# Patient Record
Sex: Female | Born: 1940 | Race: White | Hispanic: No | Marital: Married | Smoking: Former smoker
Health system: Southern US, Community
[De-identification: ages and names within clinical notes are randomized; demographics above are authoritative.]

## PROBLEM LIST (undated history)

## (undated) DIAGNOSIS — F418 Other specified anxiety disorders: Secondary | ICD-10-CM

## (undated) DIAGNOSIS — Z9889 Other specified postprocedural states: Secondary | ICD-10-CM

## (undated) DIAGNOSIS — H3530 Unspecified macular degeneration: Secondary | ICD-10-CM

## (undated) DIAGNOSIS — E7800 Pure hypercholesterolemia, unspecified: Secondary | ICD-10-CM

## (undated) DIAGNOSIS — R7303 Prediabetes: Secondary | ICD-10-CM

## (undated) DIAGNOSIS — I10 Essential (primary) hypertension: Secondary | ICD-10-CM

## (undated) DIAGNOSIS — I639 Cerebral infarction, unspecified: Secondary | ICD-10-CM

## (undated) DIAGNOSIS — R112 Nausea with vomiting, unspecified: Secondary | ICD-10-CM

## (undated) DIAGNOSIS — M1990 Unspecified osteoarthritis, unspecified site: Secondary | ICD-10-CM

## (undated) HISTORY — DX: Unspecified macular degeneration: H35.30

## (undated) HISTORY — PX: OTHER SURGICAL HISTORY: SHX169

## (undated) HISTORY — DX: Other specified anxiety disorders: F41.8

## (undated) HISTORY — PX: KNEE ARTHROSCOPY: SHX127

## (undated) HISTORY — PX: LAPAROSCOPIC APPENDECTOMY: SUR753

## (undated) HISTORY — DX: Cerebral infarction, unspecified: I63.9

## (undated) HISTORY — PX: CATARACT EXTRACTION: SUR2

## (undated) HISTORY — PX: APPENDECTOMY: SHX54

## (undated) HISTORY — PX: HEMATOMA EVACUATION: SHX5118

## (undated) HISTORY — PX: REPLACEMENT TOTAL KNEE: SUR1224

## (undated) HISTORY — PX: TONSILLECTOMY: SUR1361

---

## 1996-02-07 HISTORY — PX: POSTERIOR TIBIAL TENDON REPAIR: SHX6039

## 2005-08-25 ENCOUNTER — Ambulatory Visit (HOSPITAL_COMMUNITY): Admission: RE | Admit: 2005-08-25 | Discharge: 2005-08-25 | Payer: Self-pay | Admitting: Otolaryngology

## 2010-06-24 NOTE — Op Note (Signed)
Sarah Castro, Sarah Castro            ACCOUNT NO.:  [id number]   MEDICAL RECORD NO.:  [id number]          PATIENT TYPE:  AMB   LOCATION:  SDS                          FACILITY:  [facility]   PHYSICIAN:  Jefry H. Pollyann Kennedy, MD     DATE OF BIRTH:  Nov 03, 1940   DATE OF PROCEDURE:  DATE OF DISCHARGE:                                 OPERATIVE REPORT   PREOPERATIVE DIAGNOSES:  1. Nasal fracture with displacement.  2. Hematoma of the right nasal ala.   POSTOPERATIVE DIAGNOSES:  1. Nasal fracture with displacement.  2. Hematoma of the right nasal. ala.   PROCEDURE:  1. Closed reduction, nasal fracture with stabilization.  2. Incision and drainage of right nasal hematoma.   SURGEON:  Jefry H. Pollyann Kennedy, MD   ANESTHESIA:  General endotracheal anesthesia was used.   COMPLICATIONS:  None.   FINDINGS:  Depressed right nasal bone fracture with displacement, septal  deviation, and a large hematoma involving the right ala.   REFERRING PHYSICIAN:  Dr. Doyne Keel.   BLOOD LOSS:  Minimal.   DISPOSITION:  The patient tolerated the procedure well and was awakened,  extubated, transferred to Recovery in stable condition.   HISTORY:  This is a 70 year old lady who fell down some stairs about 9 days  ago and suffered significant injury to her nose that included a nasal dorsal  and septal deviation as well as an obvious hematoma of the right nasal alar  area.  Risks, benefits, alternatives, complications of the procedure were  explained to the patient.  She seemed to understand and agreed to surgery.   OPERATIVE PROCEDURE:  The patient was taken to the operating room and placed  on the operating table in the supine position.  Following induction of  general endotracheal anesthesia, the face was draped in the standard  fashion.  Oxymetazoline spray was used preoperatively in the nasal cavities.  1% Xylocaine with epinephrine was infiltrated into the septum and the nasal  pyriform aperture mucosa bilaterally.   Afrin-soaked pledges were placed in  the nasal cavities for several minutes.   Incision and drainage of nasal alar hematoma.  A 15 scalpel was used to  incise the vestibular mucosa just overlying the hematoma.  A large amount of  old, thick, clotted blood was evacuated and suction was used to clear out  any additional blood from the hematoma cavity.  The fracture was then  reduced using a butter knife and an Ash forceps to elevate the right nasal  bone as well as to elevate and reduce the deflection of the septum.  There  was nice reduction that seemed to be pretty stable.  The nasal cavities were  packed with large Merocel packs, and the nasal dorsum was dressed with benzo  and Steri-Strips and an Aquaplast splint.  The patient was then awakened  from anesthesia, extubated, and transferred to Recovery in stable condition.      Jefry H. Pollyann Kennedy, MD  Electronically Signed     JHR/MEDQ  D:  08/25/2005  T:  08/25/2005  Job:  [id number]   cc:   Forrest Moron

## 2011-02-07 HISTORY — PX: BUNIONECTOMY: SHX129

## 2011-02-07 HISTORY — PX: HAMMER TOE SURGERY: SHX385

## 2011-03-20 DIAGNOSIS — H40009 Preglaucoma, unspecified, unspecified eye: Secondary | ICD-10-CM | POA: Diagnosis not present

## 2011-04-06 DIAGNOSIS — H538 Other visual disturbances: Secondary | ICD-10-CM | POA: Diagnosis not present

## 2011-04-06 DIAGNOSIS — H2510 Age-related nuclear cataract, unspecified eye: Secondary | ICD-10-CM | POA: Diagnosis not present

## 2011-04-06 DIAGNOSIS — H40009 Preglaucoma, unspecified, unspecified eye: Secondary | ICD-10-CM | POA: Diagnosis not present

## 2011-04-19 DIAGNOSIS — M2010 Hallux valgus (acquired), unspecified foot: Secondary | ICD-10-CM | POA: Diagnosis not present

## 2011-04-19 DIAGNOSIS — M25579 Pain in unspecified ankle and joints of unspecified foot: Secondary | ICD-10-CM | POA: Diagnosis not present

## 2011-04-19 DIAGNOSIS — M2040 Other hammer toe(s) (acquired), unspecified foot: Secondary | ICD-10-CM | POA: Diagnosis not present

## 2011-07-17 DIAGNOSIS — M949 Disorder of cartilage, unspecified: Secondary | ICD-10-CM | POA: Diagnosis not present

## 2011-07-17 DIAGNOSIS — Z0000 Encounter for general adult medical examination without abnormal findings: Secondary | ICD-10-CM | POA: Diagnosis not present

## 2011-07-17 DIAGNOSIS — Z1331 Encounter for screening for depression: Secondary | ICD-10-CM | POA: Diagnosis not present

## 2011-07-17 DIAGNOSIS — I10 Essential (primary) hypertension: Secondary | ICD-10-CM | POA: Diagnosis not present

## 2011-07-17 DIAGNOSIS — M899 Disorder of bone, unspecified: Secondary | ICD-10-CM | POA: Diagnosis not present

## 2011-07-17 DIAGNOSIS — M1990 Unspecified osteoarthritis, unspecified site: Secondary | ICD-10-CM | POA: Diagnosis not present

## 2011-07-17 DIAGNOSIS — R7309 Other abnormal glucose: Secondary | ICD-10-CM | POA: Diagnosis not present

## 2011-07-17 DIAGNOSIS — E785 Hyperlipidemia, unspecified: Secondary | ICD-10-CM | POA: Diagnosis not present

## 2011-08-01 DIAGNOSIS — M1710 Unilateral primary osteoarthritis, unspecified knee: Secondary | ICD-10-CM | POA: Diagnosis not present

## 2011-08-01 DIAGNOSIS — M2040 Other hammer toe(s) (acquired), unspecified foot: Secondary | ICD-10-CM | POA: Diagnosis not present

## 2011-08-23 DIAGNOSIS — Z1211 Encounter for screening for malignant neoplasm of colon: Secondary | ICD-10-CM | POA: Diagnosis not present

## 2011-08-29 DIAGNOSIS — M2010 Hallux valgus (acquired), unspecified foot: Secondary | ICD-10-CM | POA: Diagnosis not present

## 2011-08-29 DIAGNOSIS — M2040 Other hammer toe(s) (acquired), unspecified foot: Secondary | ICD-10-CM | POA: Diagnosis not present

## 2011-08-29 DIAGNOSIS — M2020 Hallux rigidus, unspecified foot: Secondary | ICD-10-CM | POA: Diagnosis not present

## 2011-09-25 DIAGNOSIS — H40009 Preglaucoma, unspecified, unspecified eye: Secondary | ICD-10-CM | POA: Diagnosis not present

## 2011-10-02 DIAGNOSIS — M949 Disorder of cartilage, unspecified: Secondary | ICD-10-CM | POA: Diagnosis not present

## 2011-10-02 DIAGNOSIS — M899 Disorder of bone, unspecified: Secondary | ICD-10-CM | POA: Diagnosis not present

## 2011-10-03 DIAGNOSIS — M2020 Hallux rigidus, unspecified foot: Secondary | ICD-10-CM | POA: Diagnosis not present

## 2011-10-03 DIAGNOSIS — M2040 Other hammer toe(s) (acquired), unspecified foot: Secondary | ICD-10-CM | POA: Diagnosis not present

## 2011-10-07 DIAGNOSIS — Z23 Encounter for immunization: Secondary | ICD-10-CM | POA: Diagnosis not present

## 2011-10-07 DIAGNOSIS — B009 Herpesviral infection, unspecified: Secondary | ICD-10-CM | POA: Diagnosis not present

## 2011-10-18 DIAGNOSIS — Z5189 Encounter for other specified aftercare: Secondary | ICD-10-CM | POA: Diagnosis not present

## 2011-10-24 DIAGNOSIS — R7301 Impaired fasting glucose: Secondary | ICD-10-CM | POA: Diagnosis not present

## 2011-11-15 DIAGNOSIS — Z5189 Encounter for other specified aftercare: Secondary | ICD-10-CM | POA: Diagnosis not present

## 2011-12-13 DIAGNOSIS — Z5189 Encounter for other specified aftercare: Secondary | ICD-10-CM | POA: Diagnosis not present

## 2011-12-22 DIAGNOSIS — H2510 Age-related nuclear cataract, unspecified eye: Secondary | ICD-10-CM | POA: Diagnosis not present

## 2012-01-01 DIAGNOSIS — H269 Unspecified cataract: Secondary | ICD-10-CM | POA: Diagnosis not present

## 2012-01-01 DIAGNOSIS — H2510 Age-related nuclear cataract, unspecified eye: Secondary | ICD-10-CM | POA: Diagnosis not present

## 2012-01-03 DIAGNOSIS — M79609 Pain in unspecified limb: Secondary | ICD-10-CM | POA: Diagnosis not present

## 2012-01-16 DIAGNOSIS — E782 Mixed hyperlipidemia: Secondary | ICD-10-CM | POA: Diagnosis not present

## 2012-01-16 DIAGNOSIS — R7309 Other abnormal glucose: Secondary | ICD-10-CM | POA: Diagnosis not present

## 2012-01-16 DIAGNOSIS — I10 Essential (primary) hypertension: Secondary | ICD-10-CM | POA: Diagnosis not present

## 2012-01-18 DIAGNOSIS — M1710 Unilateral primary osteoarthritis, unspecified knee: Secondary | ICD-10-CM | POA: Diagnosis not present

## 2012-01-25 DIAGNOSIS — Z1231 Encounter for screening mammogram for malignant neoplasm of breast: Secondary | ICD-10-CM | POA: Diagnosis not present

## 2012-02-08 DIAGNOSIS — J019 Acute sinusitis, unspecified: Secondary | ICD-10-CM | POA: Diagnosis not present

## 2012-02-08 DIAGNOSIS — H6690 Otitis media, unspecified, unspecified ear: Secondary | ICD-10-CM | POA: Diagnosis not present

## 2012-02-08 DIAGNOSIS — J209 Acute bronchitis, unspecified: Secondary | ICD-10-CM | POA: Diagnosis not present

## 2012-02-19 DIAGNOSIS — M67919 Unspecified disorder of synovium and tendon, unspecified shoulder: Secondary | ICD-10-CM | POA: Diagnosis not present

## 2012-02-21 DIAGNOSIS — Z01818 Encounter for other preprocedural examination: Secondary | ICD-10-CM | POA: Diagnosis not present

## 2012-02-21 DIAGNOSIS — M1710 Unilateral primary osteoarthritis, unspecified knee: Secondary | ICD-10-CM | POA: Diagnosis not present

## 2012-02-21 DIAGNOSIS — I10 Essential (primary) hypertension: Secondary | ICD-10-CM | POA: Diagnosis not present

## 2012-03-08 ENCOUNTER — Encounter (HOSPITAL_COMMUNITY): Payer: Self-pay | Admitting: Pharmacy Technician

## 2012-03-11 ENCOUNTER — Ambulatory Visit (HOSPITAL_COMMUNITY)
Admission: RE | Admit: 2012-03-11 | Discharge: 2012-03-11 | Disposition: A | Discharge status: Home / Self Care | Payer: Medicare Other | Source: Ambulatory Visit | Attending: Orthopedic Surgery | Admitting: Orthopedic Surgery

## 2012-03-11 ENCOUNTER — Encounter (HOSPITAL_COMMUNITY)
Admission: RE | Admit: 2012-03-11 | Discharge: 2012-03-11 | Disposition: A | Discharge status: Home / Self Care | Payer: Medicare Other | Source: Ambulatory Visit | Attending: Orthopedic Surgery | Admitting: Orthopedic Surgery

## 2012-03-11 ENCOUNTER — Encounter (HOSPITAL_COMMUNITY): Payer: Self-pay

## 2012-03-11 DIAGNOSIS — I10 Essential (primary) hypertension: Secondary | ICD-10-CM | POA: Insufficient documentation

## 2012-03-11 DIAGNOSIS — Z01812 Encounter for preprocedural laboratory examination: Secondary | ICD-10-CM | POA: Diagnosis not present

## 2012-03-11 DIAGNOSIS — Z01818 Encounter for other preprocedural examination: Secondary | ICD-10-CM | POA: Diagnosis not present

## 2012-03-11 HISTORY — DX: Other specified postprocedural states: Z98.890

## 2012-03-11 HISTORY — DX: Pure hypercholesterolemia, unspecified: E78.00

## 2012-03-11 HISTORY — DX: Unspecified osteoarthritis, unspecified site: M19.90

## 2012-03-11 HISTORY — DX: Essential (primary) hypertension: I10

## 2012-03-11 HISTORY — DX: Other specified postprocedural states: R11.2

## 2012-03-11 LAB — CBC
Platelets: 188 10*3/uL (ref 150–400)
RBC: 4.52 MIL/uL (ref 3.87–5.11)
WBC: 6.4 10*3/uL (ref 4.0–10.5)

## 2012-03-11 LAB — BASIC METABOLIC PANEL
Calcium: 9.3 mg/dL (ref 8.4–10.5)
GFR calc Af Amer: >90 mL/min (ref >90)
GFR calc non Af Amer: >90 mL/min (ref >90)
Sodium: 134 mEq/L — ABNORMAL LOW (ref 135–145)

## 2012-03-11 LAB — URINALYSIS, ROUTINE W REFLEX MICROSCOPIC
Hgb urine dipstick: NEGATIVE
Protein, ur: NEGATIVE mg/dL
Urobilinogen, UA: 0.2 mg/dL (ref 0.0–1.0)

## 2012-03-11 LAB — PROTIME-INR
INR: 0.81 (ref 0.00–1.49)
Prothrombin Time: 11.2 seconds — ABNORMAL LOW (ref 11.6–15.2)

## 2012-03-11 NOTE — Patient Instructions (Addendum)
20 Sarah Castro  03/11/2012   Your procedure is scheduled on: 03/18/12  Report to [facility] at 0725 AM.  Call this number if you have problems the morning of surgery 336-: [phone]   Remember:   Do not eat food or drink liquids After Midnight.      Do not wear jewelry, make-up or nail polish.  Do not wear lotions, powders, or perfumes. You may wear deodorant.  Do not shave 48 hours prior to surgery. Men may shave face and neck.  Do not bring valuables to the hospital.  Contacts, dentures or bridgework may not be worn into surgery.  Leave suitcase in the car. After surgery it may be brought to your room.  For patients admitted to the hospital, checkout time is 11:00 AM the day of discharge.     Please read over the following fact sheets that you were given: MRSA Information, incentive spirometer fact sheet   Birdie Sons, RN  pre op nurse call if needed [phone]    FAILURE TO FOLLOW THESE INSTRUCTIONS MAY RESULT IN CANCELLATION OF YOUR SURGERY   Patient Signature: ___________________________________________

## 2012-03-13 NOTE — H&P (Signed)
TOTAL KNEE ADMISSION H&P  Patient is being admitted for right total knee arthroplasty.  Subjective:  Chief Complaint:  right knee OA / pain.  HPI: Sarah Castro, 72 y.o. female, has a history of pain and functional disability in the right knee due to arthritis and has failed non-surgical conservative treatments for greater than 12 weeks to includeNSAID's and/or analgesics and activity modification.  Onset of symptoms was gradual, starting >10 years ago with gradually worsening course since that time. The patient noted prior procedures on the knee to include  arthroscopy on the right knee(s).  Patient currently rates pain in the right knee(s) at 8 out of 10 with activity. Patient has worsening of pain with activity and weight bearing, pain that interferes with activities of daily living, pain with passive range of motion, crepitus and joint swelling.  Patient has evidence of periarticular osteophytes and joint space narrowing by imaging studies. There is no active infection.  Risks, benefits and expectations were discussed with the patient. Patient understand the risks, benefits and expectations and wishes to proceed with surgery.   D/C Plans:   Home with HHPT  Post-op Meds:   Rx given for ASA, Robaxin, Iron, Colace and MiraLax  Tranexamic Acid:   To be given  Decadron:    To be given  FYI:   Nothing to note     Past Medical History  Diagnosis Date  . Hypertension   . Arthritis   . Hypercholesteremia   . PONV (postoperative nausea and vomiting)      Current Outpatient Prescriptions on File Prior to Encounter  Medication Sig  . Calcium Carbonate-Vitamin D (CALCIUM 600 + D PO) Take 1 tablet by mouth 2 (two) times daily.  . enalapril (VASOTEC) 10 MG tablet Take 10 mg by mouth every morning.  . Multivitamin Daily  . Aspirin 81 mg Take 1 daily  . Fish Oil  1000 mg Take 2 tablets by mouth daily  . Vitamin D  400 units Take 2 tablets by mouth daily  . Red Yeast Rice Extract  600  mg Take 1 tablets by mouth daily  . Probiotic Take 1 tablets by mouth daily  . HCTZ  25 MG tablet Take 25 mg by mouth every morning.  . lovastatin (MEVACOR) 40 MG tablet Take 20 mg by mouth at bedtime.       Past Surgical History  Procedure Date  . Posterior tibial tendon repair 1998  . Bunionectomy 2013  . Hammer toe surgery 2013  . Tonsillectomy  age 69  . Hematoma evacuation 5 years ago  . Knee arthroscopy     right  . Cataract extraction     right    Allergies  Allergen Reactions  . Sulfa Antibiotics Rash    History  Substance Use Topics  . Smoking status: Former Smoker -- 0.5 packs/day for 20 years    Types: Cigarettes    Quit date: 02/07/2011  . Smokeless tobacco: Never Used  . Alcohol Use: Yes     Comment: 1 drink per day       Review of Systems  Constitutional: Negative.   HENT: Negative.   Eyes: Negative.   Respiratory: Negative.   Cardiovascular: Negative.   Gastrointestinal: Negative.   Genitourinary: Negative.   Musculoskeletal: Positive for joint pain.  Skin: Negative.   Neurological: Negative.   Endo/Heme/Allergies: Negative.   Psychiatric/Behavioral: Negative.     Objective:  Physical Exam  Constitutional: She is oriented to person, place, and time. She  appears well-developed and well-nourished.  HENT:  Head: Normocephalic and atraumatic.  Mouth/Throat: Oropharynx is clear and moist.  Eyes: Pupils are equal, round, and reactive to light.  Neck: Neck supple. No JVD present. No tracheal deviation present. No thyromegaly present.  Cardiovascular: Normal rate, regular rhythm, normal heart sounds and intact distal pulses.   Respiratory: Effort normal and breath sounds normal. No stridor. No respiratory distress. She has no wheezes.  GI: Soft. There is no tenderness. There is no guarding.  Musculoskeletal:       Right knee: She exhibits decreased range of motion, swelling and bony tenderness. She exhibits no ecchymosis, no deformity, no  laceration and no erythema. tenderness found.  Lymphadenopathy:    She has no cervical adenopathy.  Neurological: She is alert and oriented to person, place, and time.  Skin: Skin is warm and dry.  Psychiatric: She has a normal mood and affect.     Imaging Review Plain radiographs demonstrate severe degenerative joint disease of the right knee(s). The overall alignment is neutral. The bone quality appears to be good for age and reported activity level.  Assessment/Plan:  End stage arthritis, right knee   The patient history, physical examination, clinical judgment of the provider and imaging studies are consistent with end stage degenerative joint disease of the right knee(s) and total knee arthroplasty is deemed medically necessary. The treatment options including medical management, injection therapy arthroscopy and arthroplasty were discussed at length. The risks and benefits of total knee arthroplasty were presented and reviewed. The risks due to aseptic loosening, infection, stiffness, patella tracking problems, thromboembolic complications and other imponderables were discussed. The patient acknowledged the explanation, agreed to proceed with the plan and consent was signed. Patient is being admitted for inpatient treatment for surgery, pain control, PT, OT, prophylactic antibiotics, VTE prophylaxis, progressive ambulation and ADL's and discharge planning. The patient is planning to be discharged home with home health services.     Sarah Castro   PAC  03/13/2012, 1:56 PM

## 2012-03-14 NOTE — Progress Notes (Signed)
Surgery time pushed back until 1035am, patient called and made aware to arrive at Short stay at 8am. Pt agreed with no questions.

## 2012-03-18 ENCOUNTER — Inpatient Hospital Stay (HOSPITAL_COMMUNITY)
Admission: RE | Admit: 2012-03-18 | Discharge: 2012-03-19 | DRG: 470 | Disposition: A | Discharge status: Home Health | Payer: Medicare Other | Source: Ambulatory Visit | Attending: Orthopedic Surgery | Admitting: Orthopedic Surgery

## 2012-03-18 ENCOUNTER — Encounter (HOSPITAL_COMMUNITY)
Admission: RE | Disposition: A | Discharge status: Home Health | Payer: Self-pay | Source: Ambulatory Visit | Attending: Orthopedic Surgery

## 2012-03-18 ENCOUNTER — Inpatient Hospital Stay (HOSPITAL_COMMUNITY): Payer: Medicare Other | Admitting: Anesthesiology

## 2012-03-18 ENCOUNTER — Encounter (HOSPITAL_COMMUNITY): Payer: Self-pay

## 2012-03-18 ENCOUNTER — Encounter (HOSPITAL_COMMUNITY): Payer: Self-pay | Admitting: Anesthesiology

## 2012-03-18 DIAGNOSIS — I10 Essential (primary) hypertension: Secondary | ICD-10-CM | POA: Diagnosis not present

## 2012-03-18 DIAGNOSIS — IMO0002 Reserved for concepts with insufficient information to code with codable children: Secondary | ICD-10-CM | POA: Diagnosis not present

## 2012-03-18 DIAGNOSIS — M1710 Unilateral primary osteoarthritis, unspecified knee: Principal | ICD-10-CM | POA: Diagnosis present

## 2012-03-18 DIAGNOSIS — D62 Acute posthemorrhagic anemia: Secondary | ICD-10-CM | POA: Diagnosis not present

## 2012-03-18 DIAGNOSIS — M25569 Pain in unspecified knee: Secondary | ICD-10-CM | POA: Diagnosis not present

## 2012-03-18 DIAGNOSIS — Z96659 Presence of unspecified artificial knee joint: Secondary | ICD-10-CM

## 2012-03-18 HISTORY — PX: TOTAL KNEE ARTHROPLASTY: SHX125

## 2012-03-18 LAB — TYPE AND SCREEN: ABO/RH(D): A POS

## 2012-03-18 SURGERY — ARTHROPLASTY, KNEE, TOTAL
Anesthesia: Spinal | Site: Knee | Laterality: Right | Wound class: Clean

## 2012-03-18 MED ORDER — DEXAMETHASONE SODIUM PHOSPHATE 10 MG/ML IJ SOLN
10.0000 mg | Freq: Once | INTRAMUSCULAR | Status: AC
  Administered 2012-03-18: 10 mg via INTRAVENOUS

## 2012-03-18 MED ORDER — ALUM & MAG HYDROXIDE-SIMETH 200-200-20 MG/5ML PO SUSP
30.0000 mL | ORAL | Status: DC | PRN
  Administered 2012-03-19: 30 mL via ORAL
  Filled 2012-03-18: qty 30

## 2012-03-18 MED ORDER — ONDANSETRON HCL 4 MG/2ML IJ SOLN
INTRAMUSCULAR | Status: DC | PRN
  Administered 2012-03-18: 4 mg via INTRAVENOUS

## 2012-03-18 MED ORDER — SODIUM CHLORIDE 0.9 % IV SOLN
INTRAVENOUS | Status: DC
  Administered 2012-03-18 – 2012-03-19 (×2): via INTRAVENOUS
  Filled 2012-03-18 (×5): qty 1000

## 2012-03-18 MED ORDER — ACETAMINOPHEN 10 MG/ML IV SOLN
INTRAVENOUS | Status: DC | PRN
  Administered 2012-03-18: 1000 mg via INTRAVENOUS

## 2012-03-18 MED ORDER — HYDROMORPHONE HCL PF 1 MG/ML IJ SOLN
0.5000 mg | INTRAMUSCULAR | Status: DC | PRN
  Administered 2012-03-18: 2 mg via INTRAVENOUS
  Administered 2012-03-18 (×2): 1 mg via INTRAVENOUS
  Filled 2012-03-18: qty 1
  Filled 2012-03-18: qty 2

## 2012-03-18 MED ORDER — MEPERIDINE HCL 50 MG/ML IJ SOLN: 6.2500 mg | INTRAMUSCULAR | Status: DC | PRN

## 2012-03-18 MED ORDER — ZOLPIDEM TARTRATE 5 MG PO TABS: 5.0000 mg | ORAL_TABLET | Freq: Every evening | ORAL | Status: DC | PRN

## 2012-03-18 MED ORDER — LACTATED RINGERS IV SOLN: INTRAVENOUS | Status: DC

## 2012-03-18 MED ORDER — MENTHOL 3 MG MT LOZG: 1.0000 | LOZENGE | OROMUCOSAL | Status: DC | PRN

## 2012-03-18 MED ORDER — BUPIVACAINE IN DEXTROSE 0.75-8.25 % IT SOLN
INTRATHECAL | Status: DC | PRN
  Administered 2012-03-18: 1.7 mL via INTRATHECAL

## 2012-03-18 MED ORDER — BUPIVACAINE LIPOSOME 1.3 % IJ SUSP
20.0000 mL | Freq: Once | INTRAMUSCULAR | Status: AC
  Administered 2012-03-18: 60 mL
  Filled 2012-03-18: qty 20

## 2012-03-18 MED ORDER — BISACODYL 10 MG RE SUPP: 10.0000 mg | Freq: Every day | RECTAL | Status: DC | PRN

## 2012-03-18 MED ORDER — SALINE FLUSH 0.9 % IV SOLN
INTRAVENOUS | Status: DC | PRN
  Administered 2012-03-18: 40 mL

## 2012-03-18 MED ORDER — MIDAZOLAM HCL 5 MG/5ML IJ SOLN
INTRAMUSCULAR | Status: DC | PRN
  Administered 2012-03-18 (×2): 1 mg via INTRAVENOUS

## 2012-03-18 MED ORDER — DIPHENHYDRAMINE HCL 25 MG PO CAPS: 25.0000 mg | ORAL_CAPSULE | Freq: Four times a day (QID) | ORAL | Status: DC | PRN

## 2012-03-18 MED ORDER — PROMETHAZINE HCL 25 MG/ML IJ SOLN: 6.2500 mg | INTRAMUSCULAR | Status: DC | PRN

## 2012-03-18 MED ORDER — ONDANSETRON HCL 4 MG/2ML IJ SOLN
4.0000 mg | Freq: Four times a day (QID) | INTRAMUSCULAR | Status: DC | PRN
  Administered 2012-03-18 – 2012-03-19 (×2): 4 mg via INTRAVENOUS
  Filled 2012-03-18 (×2): qty 2

## 2012-03-18 MED ORDER — HYDROMORPHONE HCL PF 1 MG/ML IJ SOLN: 0.2500 mg | INTRAMUSCULAR | Status: DC | PRN

## 2012-03-18 MED ORDER — METHOCARBAMOL 500 MG PO TABS
500.0000 mg | ORAL_TABLET | Freq: Four times a day (QID) | ORAL | Status: DC | PRN
  Administered 2012-03-18 – 2012-03-19 (×2): 500 mg via ORAL
  Filled 2012-03-18 (×2): qty 1

## 2012-03-18 MED ORDER — CEFAZOLIN SODIUM-DEXTROSE 2-3 GM-% IV SOLR
2.0000 g | INTRAVENOUS | Status: AC
  Administered 2012-03-18: 2 g via INTRAVENOUS

## 2012-03-18 MED ORDER — DOCUSATE SODIUM 100 MG PO CAPS
100.0000 mg | ORAL_CAPSULE | Freq: Two times a day (BID) | ORAL | Status: DC
  Administered 2012-03-19: 100 mg via ORAL

## 2012-03-18 MED ORDER — HYDROCODONE-ACETAMINOPHEN 7.5-325 MG PO TABS
1.0000 | ORAL_TABLET | ORAL | Status: DC
  Administered 2012-03-18: 2 via ORAL
  Administered 2012-03-19: 1 via ORAL
  Administered 2012-03-19: 2 via ORAL
  Filled 2012-03-18: qty 1
  Filled 2012-03-18 (×3): qty 2

## 2012-03-18 MED ORDER — PROPOFOL 10 MG/ML IV EMUL
INTRAVENOUS | Status: DC | PRN
  Administered 2012-03-18: 100 ug/kg/min via INTRAVENOUS

## 2012-03-18 MED ORDER — POLYETHYLENE GLYCOL 3350 17 G PO PACK
17.0000 g | PACK | Freq: Two times a day (BID) | ORAL | Status: DC
  Administered 2012-03-19: 17 g via ORAL

## 2012-03-18 MED ORDER — HYDROCHLOROTHIAZIDE 25 MG PO TABS
25.0000 mg | ORAL_TABLET | Freq: Every morning | ORAL | Status: DC
  Administered 2012-03-19: 25 mg via ORAL
  Filled 2012-03-18: qty 1

## 2012-03-18 MED ORDER — DEXTROSE 5 % IV SOLN
500.0000 mg | Freq: Four times a day (QID) | INTRAVENOUS | Status: DC | PRN
  Administered 2012-03-18: 500 mg via INTRAVENOUS
  Filled 2012-03-18: qty 5

## 2012-03-18 MED ORDER — PHENOL 1.4 % MT LIQD: 1.0000 | OROMUCOSAL | Status: DC | PRN

## 2012-03-18 MED ORDER — FERROUS SULFATE 325 (65 FE) MG PO TABS
325.0000 mg | ORAL_TABLET | Freq: Three times a day (TID) | ORAL | Status: DC
  Administered 2012-03-19 (×2): 325 mg via ORAL
  Filled 2012-03-18 (×5): qty 1

## 2012-03-18 MED ORDER — ONDANSETRON HCL 4 MG PO TABS: 4.0000 mg | ORAL_TABLET | Freq: Four times a day (QID) | ORAL | Status: DC | PRN

## 2012-03-18 MED ORDER — DEXAMETHASONE SODIUM PHOSPHATE 10 MG/ML IJ SOLN
10.0000 mg | Freq: Once | INTRAMUSCULAR | Status: DC
  Filled 2012-03-18: qty 1

## 2012-03-18 MED ORDER — LACTATED RINGERS IV SOLN
INTRAVENOUS | Status: DC
  Administered 2012-03-18: 09:00:00 via INTRAVENOUS

## 2012-03-18 MED ORDER — FLEET ENEMA 7-19 GM/118ML RE ENEM: 1.0000 | ENEMA | Freq: Once | RECTAL | Status: AC | PRN

## 2012-03-18 MED ORDER — LACTATED RINGERS IV SOLN
INTRAVENOUS | Status: DC | PRN
  Administered 2012-03-18 (×3): via INTRAVENOUS

## 2012-03-18 MED ORDER — TRANEXAMIC ACID 100 MG/ML IV SOLN
1000.0000 mg | Freq: Once | INTRAVENOUS | Status: AC
  Administered 2012-03-18: 1000 mg via INTRAVENOUS
  Filled 2012-03-18: qty 10

## 2012-03-18 MED ORDER — HYDROMORPHONE HCL PF 1 MG/ML IJ SOLN
INTRAMUSCULAR | Status: AC
  Filled 2012-03-18: qty 1

## 2012-03-18 MED ORDER — RIVAROXABAN 10 MG PO TABS
10.0000 mg | ORAL_TABLET | ORAL | Status: DC
  Administered 2012-03-19: 10 mg via ORAL
  Filled 2012-03-18 (×2): qty 1

## 2012-03-18 MED ORDER — FENTANYL CITRATE 0.05 MG/ML IJ SOLN
INTRAMUSCULAR | Status: DC | PRN
  Administered 2012-03-18: 25 ug via INTRAVENOUS
  Administered 2012-03-18: 50 ug via INTRAVENOUS
  Administered 2012-03-18: 25 ug via INTRAVENOUS

## 2012-03-18 MED ORDER — CEFAZOLIN SODIUM-DEXTROSE 2-3 GM-% IV SOLR
2.0000 g | Freq: Four times a day (QID) | INTRAVENOUS | Status: AC
  Administered 2012-03-18: 2 g via INTRAVENOUS
  Filled 2012-03-18 (×2): qty 50

## 2012-03-18 MED ORDER — PHENYLEPHRINE HCL 10 MG/ML IJ SOLN
INTRAMUSCULAR | Status: DC | PRN
  Administered 2012-03-18 (×3): 80 ug via INTRAVENOUS

## 2012-03-18 MED ORDER — SIMVASTATIN 10 MG PO TABS
10.0000 mg | ORAL_TABLET | Freq: Every day | ORAL | Status: DC
  Administered 2012-03-18: 10 mg via ORAL
  Filled 2012-03-18 (×2): qty 1

## 2012-03-18 MED ORDER — GLYCOPYRROLATE 0.2 MG/ML IJ SOLN
INTRAMUSCULAR | Status: DC | PRN
  Administered 2012-03-18: 0.2 mg via INTRAVENOUS

## 2012-03-18 MED ORDER — SODIUM CHLORIDE 0.9 % IR SOLN
Status: DC | PRN
  Administered 2012-03-18: 1000 mL

## 2012-03-18 SURGICAL SUPPLY — 60 items
ADH SKN CLS APL DERMABOND .7 (GAUZE/BANDAGES/DRESSINGS) ×1
BAG SPEC THK2 15X12 ZIP CLS (MISCELLANEOUS) ×1
BAG ZIPLOCK 12X15 (MISCELLANEOUS) ×2 IMPLANT
BANDAGE ELASTIC 6 VELCRO ST LF (GAUZE/BANDAGES/DRESSINGS) ×2 IMPLANT
BANDAGE ESMARK 6X9 LF (GAUZE/BANDAGES/DRESSINGS) ×1 IMPLANT
BLADE SAW SGTL 13.0X1.19X90.0M (BLADE) ×2 IMPLANT
BNDG CMPR 9X6 STRL LF SNTH (GAUZE/BANDAGES/DRESSINGS) ×1
BNDG ESMARK 6X9 LF (GAUZE/BANDAGES/DRESSINGS) ×2
BOWL SMART MIX CTS (DISPOSABLE) ×2 IMPLANT
CEMENT HV SMART SET (Cement) ×1 IMPLANT
CLOTH BEACON ORANGE TIMEOUT ST (SAFETY) ×2 IMPLANT
CUFF TOURN SGL QUICK 24 (TOURNIQUET CUFF) ×2
CUFF TOURN SGL QUICK 34 (TOURNIQUET CUFF)
CUFF TRNQT CYL 24X4X40X1 (TOURNIQUET CUFF) IMPLANT
CUFF TRNQT CYL 34X4X40X1 (TOURNIQUET CUFF) ×1 IMPLANT
DECANTER SPIKE VIAL GLASS SM (MISCELLANEOUS) ×2 IMPLANT
DERMABOND ADVANCED (GAUZE/BANDAGES/DRESSINGS) ×1
DERMABOND ADVANCED .7 DNX12 (GAUZE/BANDAGES/DRESSINGS) ×1 IMPLANT
DRAPE EXTREMITY T 121X128X90 (DRAPE) ×2 IMPLANT
DRAPE POUCH INSTRU U-SHP 10X18 (DRAPES) ×2 IMPLANT
DRAPE U-SHAPE 47X51 STRL (DRAPES) ×2 IMPLANT
DRSG AQUACEL AG ADV 3.5X10 (GAUZE/BANDAGES/DRESSINGS) ×2 IMPLANT
DRSG TEGADERM 4X4.75 (GAUZE/BANDAGES/DRESSINGS) ×2 IMPLANT
DURAPREP 26ML APPLICATOR (WOUND CARE) ×2 IMPLANT
ELECT REM PT RETURN 9FT ADLT (ELECTROSURGICAL) ×2
ELECTRODE REM PT RTRN 9FT ADLT (ELECTROSURGICAL) ×1 IMPLANT
EVACUATOR 1/8 PVC DRAIN (DRAIN) ×2 IMPLANT
FACESHIELD LNG OPTICON STERILE (SAFETY) ×10 IMPLANT
GAUZE SPONGE 2X2 8PLY STRL LF (GAUZE/BANDAGES/DRESSINGS) ×1 IMPLANT
GLOVE BIOGEL PI IND STRL 7.5 (GLOVE) ×1 IMPLANT
GLOVE BIOGEL PI IND STRL 8 (GLOVE) ×1 IMPLANT
GLOVE BIOGEL PI INDICATOR 7.5 (GLOVE) ×1
GLOVE BIOGEL PI INDICATOR 8 (GLOVE) ×1
GLOVE ECLIPSE 8.0 STRL XLNG CF (GLOVE) ×2 IMPLANT
GLOVE ORTHO TXT STRL SZ7.5 (GLOVE) ×8 IMPLANT
GOWN BRE IMP PREV XXLGXLNG (GOWN DISPOSABLE) ×4 IMPLANT
GOWN STRL NON-REIN LRG LVL3 (GOWN DISPOSABLE) ×2 IMPLANT
HANDPIECE INTERPULSE COAX TIP (DISPOSABLE) ×2
IMMOBILIZER KNEE 20 (SOFTGOODS)
IMMOBILIZER KNEE 20 THIGH 36 (SOFTGOODS) IMPLANT
KIT BASIN OR (CUSTOM PROCEDURE TRAY) ×2 IMPLANT
MANIFOLD NEPTUNE II (INSTRUMENTS) ×2 IMPLANT
NDL SAFETY ECLIPSE 18X1.5 (NEEDLE) ×1 IMPLANT
NEEDLE HYPO 18GX1.5 SHARP (NEEDLE) ×2
NS IRRIG 1000ML POUR BTL (IV SOLUTION) ×3 IMPLANT
PACK TOTAL JOINT (CUSTOM PROCEDURE TRAY) ×2 IMPLANT
POSITIONER SURGICAL ARM (MISCELLANEOUS) ×2 IMPLANT
SET HNDPC FAN SPRY TIP SCT (DISPOSABLE) ×1 IMPLANT
SET PAD KNEE POSITIONER (MISCELLANEOUS) ×2 IMPLANT
SPONGE GAUZE 2X2 STER 10/PKG (GAUZE/BANDAGES/DRESSINGS) ×1
SUCTION FRAZIER 12FR DISP (SUCTIONS) ×2 IMPLANT
SUT MNCRL AB 4-0 PS2 18 (SUTURE) ×2 IMPLANT
SUT VIC AB 1 CT1 36 (SUTURE) ×6 IMPLANT
SUT VIC AB 2-0 CT1 27 (SUTURE) ×2
SUT VIC AB 2-0 CT1 TAPERPNT 27 (SUTURE) ×3 IMPLANT
SYR 50ML LL SCALE MARK (SYRINGE) ×2 IMPLANT
TOWEL OR 17X26 10 PK STRL BLUE (TOWEL DISPOSABLE) ×4 IMPLANT
TRAY FOLEY CATH 14FRSI W/METER (CATHETERS) ×2 IMPLANT
WATER STERILE IRR 1500ML POUR (IV SOLUTION) ×3 IMPLANT
WRAP KNEE MAXI GEL POST OP (GAUZE/BANDAGES/DRESSINGS) ×2 IMPLANT

## 2012-03-18 NOTE — Transfer of Care (Deleted)
Immediate Anesthesia Transfer of Care Note  Patient: Sarah Castro  Procedure(s) Performed: Procedure(s): TOTAL KNEE ARTHROPLASTY (Right)  Patient Location: PACU  Anesthesia Type:Spinal  Level of Consciousness: awake, alert , oriented and patient cooperative  Airway & Oxygen Therapy: Patient Spontanous Breathing and Patient connected to face mask oxygen  Post-op Assessment: Report given to PACU RN and Post -op Vital signs reviewed and stable  Post vital signs: stable  Complications: No apparent anesthesia complications

## 2012-03-18 NOTE — Transfer of Care (Signed)
Immediate Anesthesia Transfer of Care Note  Patient: Sarah Castro  Procedure(s) Performed: Procedure(s): TOTAL KNEE ARTHROPLASTY (Right)  Patient Location: PACU  Anesthesia Type:Spinal  Level of Consciousness: awake, alert , oriented and patient cooperative  Airway & Oxygen Therapy: Patient Spontanous Breathing and Patient connected to face mask oxygen  Post-op Assessment: Report given to PACU RN and Post -op Vital signs reviewed and stable  Post vital signs: stable  Complications: No apparent anesthesia complications  T11 spinal level

## 2012-03-18 NOTE — Progress Notes (Signed)
Utilization review completed.  

## 2012-03-18 NOTE — Anesthesia Procedure Notes (Signed)
Spinal  Patient location during procedure: OR Staffing Anesthesiologist: Ander Wamser Performed by: anesthesiologist  Preanesthetic Checklist Completed: patient identified, site marked, surgical consent, pre-op evaluation, timeout performed, IV checked, risks and benefits discussed and monitors and equipment checked Spinal Block Patient position: sitting Prep: Betadine Patient monitoring: heart rate, continuous pulse ox and blood pressure Approach: right paramedian Location: L3-4 Injection technique: single-shot Needle Needle type: Sprotte  Needle gauge: 24 G Needle length: 9 cm Additional Notes Expiration date of kit checked and confirmed. Patient tolerated procedure well, without complications.     

## 2012-03-18 NOTE — Interval H&P Note (Signed)
History and Physical Interval Note:  03/18/2012 10:07 AM  Sarah Castro  has presented today for surgery, with the diagnosis of Right Knee Osteoarthritis  The various methods of treatment have been discussed with the patient and family. After consideration of risks, benefits and other options for treatment, the patient has consented to  Procedure(s): TOTAL KNEE ARTHROPLASTY (Right) as a surgical intervention .  The patient's history has been reviewed, patient examined, no change in status, stable for surgery.  I have reviewed the patient's chart and labs.  Questions were answered to the patient's satisfaction.     [name]

## 2012-03-18 NOTE — Op Note (Signed)
NAME:  Sarah Castro                      MEDICAL RECORD NO.:  [id number]                             FACILITY:  [facility]      PHYSICIAN:  Madlyn Frankel. Charlann Boxer, M.D.  DATE OF BIRTH:  August 21, 1940      DATE OF PROCEDURE:  03/18/2012                                     OPERATIVE REPORT         PREOPERATIVE DIAGNOSIS:  Right knee osteoarthritis.      POSTOPERATIVE DIAGNOSIS:  Right knee osteoarthritis.      FINDINGS:  The patient was noted to have complete loss of cartilage and   bone-on-bone arthritis with associated osteophytes in the medial and patellofemoral compartments of   the knee with a significant synovitis and associated effusion.      PROCEDURE:  Right total knee replacement.      COMPONENTS USED:  DePuy rotating platform posterior stabilized knee   system, a size 4N femur, 3 tibia, 12.5 mm PS insert, and 41 patellar   button.      SURGEON:  Madlyn Frankel. Charlann Boxer, M.D.      ASSISTANT:  Lanney Gins, PA-C.      ANESTHESIA:  Spinal.      SPECIMENS:  None.      COMPLICATION:  None.      DRAINS:  One Hemovac.  EBL: <100cc      TOURNIQUET TIME:   Total Tourniquet Time Documented: Thigh (Right) - 35 minutes Total: Thigh (Right) - 35 minutes  .      The patient was stable to the recovery room.      INDICATION FOR PROCEDURE:  Sarah Castro is a 72 y.o. female patient of   mine.  The patient had been seen, evaluated, and treated conservatively in the   office with medication, activity modification, and injections.  The patient had   radiographic changes of bone-on-bone arthritis with endplate sclerosis and osteophytes noted.      The patient failed conservative measures including medication, injections, and activity modification, and at this point was ready for more definitive measures.   Based on the radiographic changes and failed conservative measures, the patient   decided to proceed with total knee replacement.  Risks of infection,   DVT, component failure,  need for revision surgery, postop course, and   expectations were all   discussed and reviewed.  Consent was obtained for benefit of pain   relief.      PROCEDURE IN DETAIL:  The patient was brought to the operative theater.   Once adequate anesthesia, preoperative antibiotics, 2 gm of Ancef administered, the patient was positioned supine with the right thigh tourniquet placed.  The  right lower extremity was prepped and draped in sterile fashion.  A time-   out was performed identifying the patient, planned procedure, and   extremity.      The right lower extremity was placed in the [facility] leg holder.  The leg was   exsanguinated, tourniquet elevated to 250 mmHg.  A midline incision was   made followed by median parapatellar arthrotomy.  Following initial   exposure, attention was  first directed to the patella.  Precut   measurement was noted to be 22 mm.  I resected down to 13 mm and used a   41 patellar button to restore patellar height as well as cover the cut   surface.      The lug holes were drilled and a metal shim was placed to protect the   patella from retractors and saw blades.      At this point, attention was now directed to the femur.  The femoral   canal was opened with a drill, irrigated to try to prevent fat emboli.  An   intramedullary rod was passed at 3 degrees valgus, 10 mm of bone was   resected off the distal femur.  Following this resection, the tibia was   subluxated anteriorly.  Using the extramedullary guide, 10 mm of bone was resected off   the proximal lateral tibia.  We confirmed the gap would be   stable medially and laterally with a 10 mm insert as well as confirmed   the cut was perpendicular in the coronal plane, checking with an alignment rod.      Once this was done, I sized the femur to be a size 4 in the anterior-   posterior dimension, chose a narrow component based on medial and   lateral dimension.  The size 4 rotation block was then pinned  in   position anterior referenced using the C-clamp to set rotation.  The   anterior, posterior, and  chamfer cuts were made without difficulty nor   notching making certain that I was along the anterior cortex to help   with flexion gap stability.      The final box cut was made off the lateral aspect of distal femur.      At this point, the tibia was sized to be a size 3, the size 3 tray was   then pinned in position through the medial third of the tubercle,   drilled, and keel punched.  Trial reduction was now carried with a 4N femur,  3 tibia, a 12.5 mm insert, and the 41 patella botton.  The knee was brought to   extension, full extension with good flexion stability with the patella   tracking through the trochlea without application of pressure.  Given   all these findings, the trial components removed.  Final components were   opened and cement was mixed.  The knee was irrigated with normal saline   solution and pulse lavage.  The synovial lining was   then injected with 0.25% Marcaine with epinephrine and 1 cc of Toradol,   total of 61 cc.      The knee was irrigated.  Final implants were then cemented onto clean and   dried cut surfaces of bone with the knee brought to extension with a 12.5 mm trial insert.      Once the cement had fully cured, the excess cement was removed   throughout the knee.  I confirmed I was satisfied with the range of   motion and stability, and the final 12.5 mm PS insert was chosen.  It was   placed into the knee.      The tourniquet had been let down at 35 minutes.  No significant   hemostasis required.  The medium Hemovac drain was placed deep.  The   extensor mechanism was then reapproximated using #1 Vicryl with the knee   in flexion.  The  remaining wound was closed with 2-0 Vicryl and running 4-0 Monocryl.   The knee was cleaned, dried, dressed sterilely using Dermabond and   Aquacel dressing.  Drain site dressed separately.  The patient  was then   brought to recovery room in stable condition, tolerating the procedure   well.   Please note that Physician Assistant, Lanney Gins, was present for the entirety of the case, and was utilized for pre-operative positioning, peri-operative retractor management, general facilitation of the procedure.  He was also utilized for primary wound closure at the end of the case.              Madlyn Frankel Charlann Boxer, M.D.

## 2012-03-18 NOTE — Anesthesia Postprocedure Evaluation (Signed)
  Anesthesia Post-op Note  Patient: [name]  Procedure(s) Performed: Procedure(s) (LRB): TOTAL KNEE ARTHROPLASTY (Right)  Patient Location: PACU  Anesthesia Type: Spinal  Level of Consciousness: awake and alert   Airway and Oxygen Therapy: Patient Spontanous Breathing  Post-op Pain: mild  Post-op Assessment: Post-op Vital signs reviewed, Patient's Cardiovascular Status Stable, Respiratory Function Stable, Patent Airway and No signs of Nausea or vomiting  Last Vitals:  Filed Vitals:   03/18/12 1330  BP: 129/65  Pulse: 67  Temp:   Resp: 16    Post-op Vital Signs: stable   Complications: No apparent anesthesia complications

## 2012-03-18 NOTE — Anesthesia Preprocedure Evaluation (Signed)
Anesthesia Evaluation  Patient identified by MRN, date of birth, ID band Patient awake    Reviewed: Allergy & Precautions, H&P , NPO status , Patient's Chart, lab work & pertinent test results  History of Anesthesia Complications (+) PONV  Airway Mallampati: II TM Distance: >3 FB Neck ROM: Full    Dental no notable dental hx.    Pulmonary neg pulmonary ROS,  breath sounds clear to auscultation  Pulmonary exam normal       Cardiovascular hypertension, Pt. on medications Rhythm:Regular Rate:Normal     Neuro/Psych negative neurological ROS  negative psych ROS   GI/Hepatic negative GI ROS, Neg liver ROS,   Endo/Other  negative endocrine ROS  Renal/GU negative Renal ROS  negative genitourinary   Musculoskeletal negative musculoskeletal ROS (+)   Abdominal   Peds negative pediatric ROS (+)  Hematology negative hematology ROS (+)   Anesthesia Other Findings Upper front caps  Reproductive/Obstetrics negative OB ROS                           Anesthesia Physical Anesthesia Plan  ASA: II  Anesthesia Plan: Spinal   Post-op Pain Management:    Induction:   Airway Management Planned: Simple Face Mask  Additional Equipment:   Intra-op Plan:   Post-operative Plan:   Informed Consent: I have reviewed the patients History and Physical, chart, labs and discussed the procedure including the risks, benefits and alternatives for the proposed anesthesia with the patient or authorized representative who has indicated his/her understanding and acceptance.   Dental advisory given  Plan Discussed with: CRNA  Anesthesia Plan Comments:         Anesthesia Quick Evaluation

## 2012-03-19 ENCOUNTER — Encounter (HOSPITAL_COMMUNITY): Payer: Self-pay | Admitting: Orthopedic Surgery

## 2012-03-19 DIAGNOSIS — D62 Acute posthemorrhagic anemia: Secondary | ICD-10-CM | POA: Diagnosis not present

## 2012-03-19 LAB — BASIC METABOLIC PANEL
CO2: 28 mEq/L (ref 19–32)
GFR calc non Af Amer: >90 mL/min (ref >90)
Glucose, Bld: 147 mg/dL — ABNORMAL HIGH (ref 70–99)
Potassium: 3.5 mEq/L (ref 3.5–5.1)
Sodium: 136 mEq/L (ref 135–145)

## 2012-03-19 LAB — CBC
Hemoglobin: 11.4 g/dL — ABNORMAL LOW (ref 12.0–15.0)
RBC: 3.5 MIL/uL — ABNORMAL LOW (ref 3.87–5.11)

## 2012-03-19 MED ORDER — DSS 100 MG PO CAPS: 100.0000 mg | ORAL_CAPSULE | Freq: Two times a day (BID) | ORAL | Status: DC

## 2012-03-19 MED ORDER — HYDROCODONE-ACETAMINOPHEN 7.5-325 MG PO TABS: 1.0000 | ORAL_TABLET | ORAL | Status: DC | PRN

## 2012-03-19 MED ORDER — METHOCARBAMOL 500 MG PO TABS: 500.0000 mg | ORAL_TABLET | Freq: Four times a day (QID) | ORAL | Status: DC | PRN

## 2012-03-19 MED ORDER — POLYETHYLENE GLYCOL 3350 17 G PO PACK: 17.0000 g | PACK | Freq: Two times a day (BID) | ORAL | Status: DC

## 2012-03-19 MED ORDER — DIPHENHYDRAMINE HCL 25 MG PO CAPS: 25.0000 mg | ORAL_CAPSULE | Freq: Four times a day (QID) | ORAL | Status: DC | PRN

## 2012-03-19 MED ORDER — FERROUS SULFATE 325 (65 FE) MG PO TABS: 325.0000 mg | ORAL_TABLET | Freq: Three times a day (TID) | ORAL | Status: DC

## 2012-03-19 MED ORDER — ASPIRIN EC 325 MG PO TBEC: 325.0000 mg | DELAYED_RELEASE_TABLET | Freq: Two times a day (BID) | ORAL | Status: DC

## 2012-03-19 NOTE — Progress Notes (Signed)
Physical Therapy Treatment Patient Details Name: Sarah Castro MRN: [id number] DOB: 17-Dec-1940 Today's Date: 03/19/2012 Time: 4098-1191 PT Time Calculation (min): 25 min  PT Assessment / Plan / Recommendation Comments on Treatment Session  Provided home ther ex program    Follow Up Recommendations  Home health PT     Does the patient have the potential to tolerate intense rehabilitation     Barriers to Discharge        Equipment Recommendations  None recommended by PT    Recommendations for Other Services    Frequency 7X/week   Plan Discharge plan remains appropriate    Precautions / Restrictions Precautions Precautions: Knee;Fall Restrictions Weight Bearing Restrictions: No Other Position/Activity Restrictions: WBAT   Pertinent Vitals/Pain 4/10; premed, ice packs provided    Mobility  Transfers Transfers: Sit to Stand;Stand to Sit Sit to Stand: 4: Min guard;5: Supervision Stand to Sit: 4: Min guard;5: Supervision Details for Transfer Assistance: cues for use of UEs and for LE management Ambulation/Gait Ambulation/Gait Assistance: 4: Min guard;5: Supervision Ambulation Distance (Feet): 75 Feet Assistive device: Rolling walker Ambulation/Gait Assistance Details: min cues for sequence and position from RW Gait Pattern: Step-to pattern Stairs: Yes Stairs Assistance: 4: Min guard Stair Management Technique: No rails;Backwards;With walker;Step to pattern Number of Stairs: 1 (twice)    Exercises Total Joint Exercises Ankle Circles/Pumps: AROM;15 reps;Supine;Both Quad Sets: AROM;Both;10 reps;Supine Heel Slides: AAROM;10 reps;Supine;Right Straight Leg Raises: AROM;Right;10 reps;Supine   PT Diagnosis:    PT Problem List:   PT Treatment Interventions:     PT Goals Acute Rehab PT Goals PT Goal Formulation: With patient Time For Goal Achievement: 03/21/12 Potential to Achieve Goals: Good Pt will go Supine/Side to Sit: with supervision PT Goal: Supine/Side  to Sit - Progress: Progressing toward goal Pt will go Sit to Supine/Side: with supervision PT Goal: Sit to Supine/Side - Progress: Progressing toward goal Pt will go Sit to Stand: with supervision PT Goal: Sit to Stand - Progress: Progressing toward goal Pt will go Stand to Sit: with supervision PT Goal: Stand to Sit - Progress: Progressing toward goal Pt will Ambulate: 51 - 150 feet;with supervision;with rolling walker PT Goal: Ambulate - Progress: Progressing toward goal Pt will Go Up / Down Stairs: 1-2 stairs;with min assist;with rolling walker PT Goal: Up/Down Stairs - Progress: Met  Visit Information  Last PT Received On: 03/19/12 Assistance Needed: +1    Subjective Data  Patient Stated Goal: Dance with my pharmacist in 2 months   Cognition  Cognition Overall Cognitive Status: Appears within functional limits for tasks assessed/performed Arousal/Alertness: Awake/alert Orientation Level: Appears intact for tasks assessed Behavior During Session: [facility] for tasks performed    Balance     End of Session PT - End of Session Equipment Utilized During Treatment: Gait belt Activity Tolerance: Patient tolerated treatment well Patient left: in chair;with call bell/phone within reach Nurse Communication: Mobility status   GP     Sarah Castro 03/19/2012, 2:45 PM

## 2012-03-19 NOTE — Evaluation (Signed)
Physical Therapy Evaluation Patient Details Name: Sarah Castro MRN: [id number] DOB: 11/06/1940 Today's Date: 03/19/2012 Time: 9629-5284 PT Time Calculation (min): 33 min  PT Assessment / Plan / Recommendation Clinical Impression  Pt s/p R TKR presents with decreased R LE strength/ROM and R LE pain limiting functional mobility    PT Assessment  Patient needs continued PT services    Follow Up Recommendations  Home health PT    Does the patient have the potential to tolerate intense rehabilitation      Barriers to Discharge None      Equipment Recommendations  None recommended by PT    Recommendations for Other Services     Frequency 7X/week    Precautions / Restrictions Precautions Precautions: Knee;Fall Restrictions Weight Bearing Restrictions: No Other Position/Activity Restrictions: WBAT   Pertinent Vitals/Pain 2/10; premed, ice packs provided      Mobility  Bed Mobility Bed Mobility: Supine to Sit Supine to Sit: 5: Supervision Transfers Transfers: Sit to Stand;Stand to Sit Sit to Stand: 4: Min guard Stand to Sit: 4: Min guard Details for Transfer Assistance: cues for use of UEs and for LE management Ambulation/Gait Ambulation/Gait Assistance: 4: Min assist Ambulation Distance (Feet): 74 Feet Assistive device: Rolling walker Ambulation/Gait Assistance Details: min cues for sequence and position from RW Gait Pattern: Step-to pattern Stairs: No    Exercises Total Joint Exercises Ankle Circles/Pumps: AROM;15 reps;Supine;Both Quad Sets: AROM;Both;10 reps;Supine Heel Slides: AAROM;10 reps;Supine;Right Straight Leg Raises: AROM;Right;10 reps;Supine   PT Diagnosis: Difficulty walking  PT Problem List: Decreased strength;Decreased range of motion;Decreased activity tolerance;Decreased mobility;Decreased knowledge of use of DME;Pain PT Treatment Interventions: DME instruction;Gait training;Stair training;Functional mobility training;Therapeutic  activities;Therapeutic exercise;Patient/family education   PT Goals Acute Rehab PT Goals PT Goal Formulation: With patient Time For Goal Achievement: 03/21/12 Potential to Achieve Goals: Good Pt will go Supine/Side to Sit: with supervision PT Goal: Supine/Side to Sit - Progress: Goal set today Pt will go Sit to Supine/Side: with supervision PT Goal: Sit to Supine/Side - Progress: Goal set today Pt will go Sit to Stand: with supervision PT Goal: Sit to Stand - Progress: Goal set today Pt will go Stand to Sit: with supervision PT Goal: Stand to Sit - Progress: Goal set today Pt will Ambulate: 51 - 150 feet;with supervision;with rolling walker PT Goal: Ambulate - Progress: Goal set today Pt will Go Up / Down Stairs: 1-2 stairs;with min assist;with rolling walker PT Goal: Up/Down Stairs - Progress: Goal set today  Visit Information  Last PT Received On: 03/19/12 Assistance Needed: +1    Subjective Data  Subjective: I want to go home today.  My hip and leg has been hurting since before surgery. Patient Stated Goal: Dance with my pharmacist in 2 months   Prior Functioning  Home Living Lives With: Spouse Available Help at Discharge: Family Type of Home: House Home Access: Stairs to enter Secretary/administrator of Steps: 1 Entrance Stairs-Rails: None Home Layout: One level Home Adaptive Equipment: Walker - rolling;Bedside commode/3-in-1 Prior Function Level of Independence: Independent;Independent with assistive device(s) Able to Take Stairs?: Yes Driving: Yes Communication Communication: No difficulties    Cognition  Cognition Overall Cognitive Status: Appears within functional limits for tasks assessed/performed Arousal/Alertness: Awake/alert Orientation Level: Appears intact for tasks assessed Behavior During Session: [facility] for tasks performed    Extremity/Trunk Assessment Right Upper Extremity Assessment RUE ROM/Strength/Tone: [facility] for tasks assessed Left Upper  Extremity Assessment LUE ROM/Strength/Tone: [facility] for tasks assessed Right Lower Extremity Assessment RLE ROM/Strength/Tone: Deficits RLE ROM/Strength/Tone  Deficits: QUADS 3/5, AAROM at knee  -10 - 70 Left Lower Extremity Assessment LLE ROM/Strength/Tone: WFL for tasks assessed   Balance    End of Session PT - End of Session Equipment Utilized During Treatment: Gait belt Activity Tolerance: Patient tolerated treatment well Patient left: in chair;with call bell/phone within reach Nurse Communication: Mobility status  GP     Normajean Nash 03/19/2012, 8:59 AM

## 2012-03-19 NOTE — Care Management Note (Signed)
    Page 1 of 1   03/19/2012     12:35:11 PM   CARE MANAGEMENT NOTE 03/19/2012  Patient:  Sarah Castro, Sarah Castro   Account Number:  [id number]  Date Initiated:  03/19/2012  Documentation initiated by:  Colleen Can  Subjective/Objective Assessment:   dx rt knee osteoarthritis; total knee replacemnt    Pre-arranged with Genevieve Norlander for [facility] pt services. Services will start day after pt is discharged.     Action/Plan:   CM spoke with patient. Plans are for patient to return to her home in [place] where spouse will be caregiver. She already has RW. Plans to use [facility] for [facility] services   Anticipated DC Date:  03/19/2012   Anticipated DC Plan:  HOME W HOME HEALTH SERVICES      DC Planning Services  CM consult      [facility] Choice  HOME HEALTH   Choice offered to / List presented to:  C-1 Patient        HH arranged  HH-2 PT      [facility] agency  [facility]   Status of service:  Completed, signed off Medicare Important Message given?  NA - LOS <3 / Initial given by admissions (If response is "NO", the following Medicare IM given date fields will be blank) Date Medicare IM given:   Date Additional Medicare IM given:    Discharge Disposition:    Per UR Regulation:    If discussed at Long Length of Stay Meetings, dates discussed:    Comments:

## 2012-03-19 NOTE — Progress Notes (Signed)
OT Cancellation Note  Patient Details Name: Sarah Castro MRN: [id number] DOB: Jul 23, 1940   Cancelled Treatment:    Reason Eval/Treat Not Completed:  (screen:  no OT needs)  Basel Defalco 03/19/2012, 11:42 AM Marica Otter, OTR/L [phone] 03/19/2012

## 2012-03-19 NOTE — Progress Notes (Signed)
   Subjective: 1 Day Post-Op Procedure(s) (LRB): TOTAL KNEE ARTHROPLASTY (Right)   Patient reports pain as mild, pain well controlled. No events throughout the night. Ready to be discharged home.  Objective:   VITALS:   Filed Vitals:   03/19/12 0535  BP: 154/65  Pulse: 67  Temp: 97.9 F (36.6 C)  Resp: 16    Neurovascular intact Dorsiflexion/Plantar flexion intact Incision: dressing C/D/I No cellulitis present Compartment soft  LABS  Recent Labs  03/19/12 0423  HGB 11.4*  HCT 32.3*  WBC 7.6  PLT 162     Recent Labs  03/19/12 0423  NA 136  K 3.5  BUN 11  CREATININE 0.41*  GLUCOSE 147*     Assessment/Plan: 1 Day Post-Op Procedure(s) (LRB): TOTAL KNEE ARTHROPLASTY (Right) HV drain d/c'ed Foley cath d/c'ed Advance diet Up with therapy D/C IV fluids Discharge home with home health Follow up in 2 weeks at [facility]. Follow up with OLIN,Breaunna Gottlieb D in 2 weeks.  Contact information:  [facility] [address] [place] [address] [phone]    Expected ABLA  Treated with iron and will observe      Anastasio Auerbach. Claribel Sachs   PAC  03/19/2012, 7:51 AM

## 2012-03-20 DIAGNOSIS — Z471 Aftercare following joint replacement surgery: Secondary | ICD-10-CM | POA: Diagnosis not present

## 2012-03-20 DIAGNOSIS — D649 Anemia, unspecified: Secondary | ICD-10-CM | POA: Diagnosis not present

## 2012-03-20 DIAGNOSIS — Z96659 Presence of unspecified artificial knee joint: Secondary | ICD-10-CM | POA: Diagnosis not present

## 2012-03-20 DIAGNOSIS — I10 Essential (primary) hypertension: Secondary | ICD-10-CM | POA: Diagnosis not present

## 2012-03-20 DIAGNOSIS — IMO0001 Reserved for inherently not codable concepts without codable children: Secondary | ICD-10-CM | POA: Diagnosis not present

## 2012-03-20 DIAGNOSIS — Z4889 Encounter for other specified surgical aftercare: Secondary | ICD-10-CM | POA: Diagnosis not present

## 2012-03-22 DIAGNOSIS — IMO0001 Reserved for inherently not codable concepts without codable children: Secondary | ICD-10-CM | POA: Diagnosis not present

## 2012-03-22 DIAGNOSIS — Z471 Aftercare following joint replacement surgery: Secondary | ICD-10-CM | POA: Diagnosis not present

## 2012-03-22 DIAGNOSIS — D649 Anemia, unspecified: Secondary | ICD-10-CM | POA: Diagnosis not present

## 2012-03-22 DIAGNOSIS — Z96659 Presence of unspecified artificial knee joint: Secondary | ICD-10-CM | POA: Diagnosis not present

## 2012-03-22 DIAGNOSIS — I10 Essential (primary) hypertension: Secondary | ICD-10-CM | POA: Diagnosis not present

## 2012-03-25 DIAGNOSIS — IMO0001 Reserved for inherently not codable concepts without codable children: Secondary | ICD-10-CM | POA: Diagnosis not present

## 2012-03-25 DIAGNOSIS — D649 Anemia, unspecified: Secondary | ICD-10-CM | POA: Diagnosis not present

## 2012-03-25 DIAGNOSIS — Z471 Aftercare following joint replacement surgery: Secondary | ICD-10-CM | POA: Diagnosis not present

## 2012-03-25 DIAGNOSIS — Z96659 Presence of unspecified artificial knee joint: Secondary | ICD-10-CM | POA: Diagnosis not present

## 2012-03-25 DIAGNOSIS — I10 Essential (primary) hypertension: Secondary | ICD-10-CM | POA: Diagnosis not present

## 2012-03-25 NOTE — Discharge Summary (Signed)
Physician Discharge Summary  Patient ID: Sarah Castro MRN: [id number] DOB/AGE: Mar 14, 1940 72 y.o.  Admit date: 03/18/2012 Discharge date: 03/19/2012   Procedures:  Procedure(s) (LRB): TOTAL KNEE ARTHROPLASTY (Right)  Attending Physician:  Dr. Durene Romans   Admission Diagnoses:   Right knee OA / pain  Discharge Diagnoses:  Principal Problem:   S/P right TKA Active Problems:   Expected blood loss anemia  Diagnosis  . Hypertension  . Arthritis  . Hypercholesteremia  . PONV (postoperative nausea and vomiting)    HPI:   Sarah Castro, 72 y.o. female, has a history of pain and functional disability in the right knee due to arthritis and has failed non-surgical conservative treatments for greater than 12 weeks to includeNSAID's and/or analgesics and activity modification. Onset of symptoms was gradual, starting >10 years ago with gradually worsening course since that time. The patient noted prior procedures on the knee to include arthroscopy on the right knee(s). Patient currently rates pain in the right knee(s) at 8 out of 10 with activity. Patient has worsening of pain with activity and weight bearing, pain that interferes with activities of daily living, pain with passive range of motion, crepitus and joint swelling. Patient has evidence of periarticular osteophytes and joint space narrowing by imaging studies. There is no active infection. Risks, benefits and expectations were discussed with the patient. Patient understand the risks, benefits and expectations and wishes to proceed with surgery.   PCP: Georgann Housekeeper, MD   Discharged Condition: good  Hospital Course:  Patient underwent the above stated procedure on 03/18/2012. Patient tolerated the procedure well and brought to the recovery room in good condition and subsequently to the floor.  POD #1 BP: 154/65 ; Pulse: 67 ; Temp: 97.9 F (36.6 C) ; Resp: 16  Pt's foley was removed, as well as the hemovac drain  removed. IV was changed to a saline lock. Patient reports pain as mild, pain well controlled. No events throughout the night. Ready to be discharged home. Neurovascular intact, dorsiflexion/plantar flexion intact, incision: dressing C/D/I, no cellulitis present and compartment soft.   LABS  Basename  03/19/12   0423   HGB  11.4  HCT  32.3    Discharge Exam: General appearance: alert, cooperative and no distress Extremities: Homans sign is negative, no sign of DVT, no edema, redness or tenderness in the calves or thighs and no ulcers, gangrene or trophic changes  Disposition:   Home-Health Care Svc with follow up in 2 weeks   Follow-up Information   Follow up with [name], MD. Schedule an appointment as soon as possible for a visit in 2 weeks.   Contact information:   [address] [place] [address] [phone]       Discharge Orders   Future Orders Complete By Expires     Call MD / Call 911  As directed     Comments:      If you experience chest pain or shortness of breath, CALL 911 and be transported to the hospital emergency room.  If you develope a fever above 101 F, pus (white drainage) or increased drainage or redness at the wound, or calf pain, call your surgeon's office.    Change dressing  As directed     Comments:      Maintain surgical dressing for 10-14 days, then change the dressing daily with sterile 4 x 4 inch gauze dressing and tape. Keep the area dry and clean.    Constipation Prevention  As directed     Comments:      Drink plenty of fluids.  Prune juice may be helpful.  You may use a stool softener, such as Colace (over the counter) 100 mg twice a day.  Use MiraLax (over the counter) for constipation as needed.    Diet - low sodium heart healthy  As directed     Discharge instructions  As directed     Comments:      Maintain surgical dressing for 10-14 days, then replace with gauze and tape. Keep the area dry and clean until follow  up. Follow up in 2 weeks at [facility]. Call with any questions or concerns.    Driving restrictions  As directed     Comments:      No driving for 4 weeks    Increase activity slowly as tolerated  As directed     TED hose  As directed     Comments:      Use stockings (TED hose) for 2 weeks on both leg(s).  You may remove them at night for sleeping.    Weight bearing as tolerated  As directed          Medication List    STOP taking these medications       aspirin 81 MG tablet      TAKE these medications       acidophilus Caps  Take 1 capsule by mouth daily.     aspirin EC 325 MG tablet  Take 1 tablet (325 mg total) by mouth 2 (two) times daily.     CALCIUM 600 + D PO  Take 1 tablet by mouth 2 (two) times daily.     cholecalciferol 400 UNITS Tabs  Commonly known as:  VITAMIN D  Take 400-800 Units by mouth 2 (two) times daily. Pt takes 400 mg at night. 800 mg qam     diphenhydrAMINE 25 mg capsule  Commonly known as:  BENADRYL  Take 1 capsule (25 mg total) by mouth every 6 (six) hours as needed for itching, allergies or sleep.     DSS 100 MG Caps  Take 100 mg by mouth 2 (two) times daily.     enalapril 10 MG tablet  Commonly known as:  VASOTEC  Take 10 mg by mouth every morning.     ferrous sulfate 325 (65 FE) MG tablet  Take 1 tablet (325 mg total) by mouth 3 (three) times daily after meals.     fish oil-omega-3 fatty acids 1000 MG capsule  Take 2 g by mouth 2 (two) times daily.     hydrochlorothiazide 25 MG tablet  Commonly known as:  HYDRODIURIL  Take 25 mg by mouth every morning.     HYDROcodone-acetaminophen 7.5-325 MG per tablet  Commonly known as:  NORCO  Take 1-2 tablets by mouth every 4 (four) hours as needed for pain.     lovastatin 40 MG tablet  Commonly known as:  MEVACOR  Take 20 mg by mouth at bedtime.     methocarbamol 500 MG tablet  Commonly known as:  ROBAXIN  Take 1 tablet (500 mg total) by mouth every 6 (six) hours as  needed (muscle spasms).     multivitamin with minerals tablet  Take 1 tablet by mouth daily.     polyethylene glycol packet  Commonly known as:  MIRALAX / GLYCOLAX  Take 17 g by mouth 2 (two) times daily.     Red Yeast Rice Extract 600  MG Caps  Take 600 mg by mouth 2 (two) times daily.         Signed: Anastasio Auerbach. Lorenia Hoston   PAC  03/25/2012, 9:47 AM

## 2012-03-26 DIAGNOSIS — D649 Anemia, unspecified: Secondary | ICD-10-CM | POA: Diagnosis not present

## 2012-03-26 DIAGNOSIS — I10 Essential (primary) hypertension: Secondary | ICD-10-CM | POA: Diagnosis not present

## 2012-03-26 DIAGNOSIS — Z96659 Presence of unspecified artificial knee joint: Secondary | ICD-10-CM | POA: Diagnosis not present

## 2012-03-26 DIAGNOSIS — Z471 Aftercare following joint replacement surgery: Secondary | ICD-10-CM | POA: Diagnosis not present

## 2012-03-26 DIAGNOSIS — IMO0001 Reserved for inherently not codable concepts without codable children: Secondary | ICD-10-CM | POA: Diagnosis not present

## 2012-03-27 DIAGNOSIS — D649 Anemia, unspecified: Secondary | ICD-10-CM | POA: Diagnosis not present

## 2012-03-27 DIAGNOSIS — Z96659 Presence of unspecified artificial knee joint: Secondary | ICD-10-CM | POA: Diagnosis not present

## 2012-03-27 DIAGNOSIS — IMO0001 Reserved for inherently not codable concepts without codable children: Secondary | ICD-10-CM | POA: Diagnosis not present

## 2012-03-27 DIAGNOSIS — Z471 Aftercare following joint replacement surgery: Secondary | ICD-10-CM | POA: Diagnosis not present

## 2012-03-27 DIAGNOSIS — I10 Essential (primary) hypertension: Secondary | ICD-10-CM | POA: Diagnosis not present

## 2012-03-28 DIAGNOSIS — D649 Anemia, unspecified: Secondary | ICD-10-CM | POA: Diagnosis not present

## 2012-03-28 DIAGNOSIS — I10 Essential (primary) hypertension: Secondary | ICD-10-CM | POA: Diagnosis not present

## 2012-03-28 DIAGNOSIS — IMO0001 Reserved for inherently not codable concepts without codable children: Secondary | ICD-10-CM | POA: Diagnosis not present

## 2012-03-28 DIAGNOSIS — Z96659 Presence of unspecified artificial knee joint: Secondary | ICD-10-CM | POA: Diagnosis not present

## 2012-03-28 DIAGNOSIS — Z471 Aftercare following joint replacement surgery: Secondary | ICD-10-CM | POA: Diagnosis not present

## 2012-03-29 DIAGNOSIS — IMO0001 Reserved for inherently not codable concepts without codable children: Secondary | ICD-10-CM | POA: Diagnosis not present

## 2012-03-29 DIAGNOSIS — Z96659 Presence of unspecified artificial knee joint: Secondary | ICD-10-CM | POA: Diagnosis not present

## 2012-03-29 DIAGNOSIS — Z471 Aftercare following joint replacement surgery: Secondary | ICD-10-CM | POA: Diagnosis not present

## 2012-03-29 DIAGNOSIS — D649 Anemia, unspecified: Secondary | ICD-10-CM | POA: Diagnosis not present

## 2012-03-29 DIAGNOSIS — I10 Essential (primary) hypertension: Secondary | ICD-10-CM | POA: Diagnosis not present

## 2012-04-02 DIAGNOSIS — Z471 Aftercare following joint replacement surgery: Secondary | ICD-10-CM | POA: Diagnosis not present

## 2012-04-02 DIAGNOSIS — Z96659 Presence of unspecified artificial knee joint: Secondary | ICD-10-CM | POA: Diagnosis not present

## 2012-04-02 DIAGNOSIS — D649 Anemia, unspecified: Secondary | ICD-10-CM | POA: Diagnosis not present

## 2012-04-02 DIAGNOSIS — IMO0001 Reserved for inherently not codable concepts without codable children: Secondary | ICD-10-CM | POA: Diagnosis not present

## 2012-04-02 DIAGNOSIS — I10 Essential (primary) hypertension: Secondary | ICD-10-CM | POA: Diagnosis not present

## 2012-04-03 DIAGNOSIS — Z96659 Presence of unspecified artificial knee joint: Secondary | ICD-10-CM | POA: Diagnosis not present

## 2012-04-03 DIAGNOSIS — I10 Essential (primary) hypertension: Secondary | ICD-10-CM | POA: Diagnosis not present

## 2012-04-03 DIAGNOSIS — D649 Anemia, unspecified: Secondary | ICD-10-CM | POA: Diagnosis not present

## 2012-04-03 DIAGNOSIS — IMO0001 Reserved for inherently not codable concepts without codable children: Secondary | ICD-10-CM | POA: Diagnosis not present

## 2012-04-03 DIAGNOSIS — Z471 Aftercare following joint replacement surgery: Secondary | ICD-10-CM | POA: Diagnosis not present

## 2012-04-05 DIAGNOSIS — I10 Essential (primary) hypertension: Secondary | ICD-10-CM | POA: Diagnosis not present

## 2012-04-05 DIAGNOSIS — Z471 Aftercare following joint replacement surgery: Secondary | ICD-10-CM | POA: Diagnosis not present

## 2012-04-05 DIAGNOSIS — IMO0001 Reserved for inherently not codable concepts without codable children: Secondary | ICD-10-CM | POA: Diagnosis not present

## 2012-04-05 DIAGNOSIS — Z96659 Presence of unspecified artificial knee joint: Secondary | ICD-10-CM | POA: Diagnosis not present

## 2012-04-05 DIAGNOSIS — D649 Anemia, unspecified: Secondary | ICD-10-CM | POA: Diagnosis not present

## 2012-04-11 DIAGNOSIS — R262 Difficulty in walking, not elsewhere classified: Secondary | ICD-10-CM | POA: Diagnosis not present

## 2012-04-11 DIAGNOSIS — M1710 Unilateral primary osteoarthritis, unspecified knee: Secondary | ICD-10-CM | POA: Diagnosis not present

## 2012-04-16 DIAGNOSIS — M1710 Unilateral primary osteoarthritis, unspecified knee: Secondary | ICD-10-CM | POA: Diagnosis not present

## 2012-04-16 DIAGNOSIS — R262 Difficulty in walking, not elsewhere classified: Secondary | ICD-10-CM | POA: Diagnosis not present

## 2012-04-19 DIAGNOSIS — M1710 Unilateral primary osteoarthritis, unspecified knee: Secondary | ICD-10-CM | POA: Diagnosis not present

## 2012-04-19 DIAGNOSIS — R262 Difficulty in walking, not elsewhere classified: Secondary | ICD-10-CM | POA: Diagnosis not present

## 2012-04-23 DIAGNOSIS — M1710 Unilateral primary osteoarthritis, unspecified knee: Secondary | ICD-10-CM | POA: Diagnosis not present

## 2012-04-23 DIAGNOSIS — R262 Difficulty in walking, not elsewhere classified: Secondary | ICD-10-CM | POA: Diagnosis not present

## 2012-04-24 DIAGNOSIS — H26499 Other secondary cataract, unspecified eye: Secondary | ICD-10-CM | POA: Diagnosis not present

## 2012-04-24 DIAGNOSIS — H02839 Dermatochalasis of unspecified eye, unspecified eyelid: Secondary | ICD-10-CM | POA: Diagnosis not present

## 2012-04-24 DIAGNOSIS — H2510 Age-related nuclear cataract, unspecified eye: Secondary | ICD-10-CM | POA: Diagnosis not present

## 2012-04-26 DIAGNOSIS — M1710 Unilateral primary osteoarthritis, unspecified knee: Secondary | ICD-10-CM | POA: Diagnosis not present

## 2012-04-26 DIAGNOSIS — R262 Difficulty in walking, not elsewhere classified: Secondary | ICD-10-CM | POA: Diagnosis not present

## 2012-04-29 DIAGNOSIS — H269 Unspecified cataract: Secondary | ICD-10-CM | POA: Diagnosis not present

## 2012-04-29 DIAGNOSIS — H2510 Age-related nuclear cataract, unspecified eye: Secondary | ICD-10-CM | POA: Diagnosis not present

## 2012-04-29 DIAGNOSIS — H52229 Regular astigmatism, unspecified eye: Secondary | ICD-10-CM | POA: Diagnosis not present

## 2012-04-30 DIAGNOSIS — R262 Difficulty in walking, not elsewhere classified: Secondary | ICD-10-CM | POA: Diagnosis not present

## 2012-04-30 DIAGNOSIS — M1710 Unilateral primary osteoarthritis, unspecified knee: Secondary | ICD-10-CM | POA: Diagnosis not present

## 2012-05-01 DIAGNOSIS — Z4889 Encounter for other specified surgical aftercare: Secondary | ICD-10-CM | POA: Diagnosis not present

## 2012-05-03 DIAGNOSIS — R262 Difficulty in walking, not elsewhere classified: Secondary | ICD-10-CM | POA: Diagnosis not present

## 2012-05-03 DIAGNOSIS — M1710 Unilateral primary osteoarthritis, unspecified knee: Secondary | ICD-10-CM | POA: Diagnosis not present

## 2012-05-07 DIAGNOSIS — R262 Difficulty in walking, not elsewhere classified: Secondary | ICD-10-CM | POA: Diagnosis not present

## 2012-05-07 DIAGNOSIS — M1710 Unilateral primary osteoarthritis, unspecified knee: Secondary | ICD-10-CM | POA: Diagnosis not present

## 2012-05-09 DIAGNOSIS — R262 Difficulty in walking, not elsewhere classified: Secondary | ICD-10-CM | POA: Diagnosis not present

## 2012-05-09 DIAGNOSIS — M1710 Unilateral primary osteoarthritis, unspecified knee: Secondary | ICD-10-CM | POA: Diagnosis not present

## 2012-05-14 DIAGNOSIS — M1710 Unilateral primary osteoarthritis, unspecified knee: Secondary | ICD-10-CM | POA: Diagnosis not present

## 2012-05-14 DIAGNOSIS — R262 Difficulty in walking, not elsewhere classified: Secondary | ICD-10-CM | POA: Diagnosis not present

## 2012-05-15 DIAGNOSIS — H26499 Other secondary cataract, unspecified eye: Secondary | ICD-10-CM | POA: Diagnosis not present

## 2012-05-16 DIAGNOSIS — M1710 Unilateral primary osteoarthritis, unspecified knee: Secondary | ICD-10-CM | POA: Diagnosis not present

## 2012-05-16 DIAGNOSIS — R262 Difficulty in walking, not elsewhere classified: Secondary | ICD-10-CM | POA: Diagnosis not present

## 2012-05-21 DIAGNOSIS — R262 Difficulty in walking, not elsewhere classified: Secondary | ICD-10-CM | POA: Diagnosis not present

## 2012-05-21 DIAGNOSIS — M1710 Unilateral primary osteoarthritis, unspecified knee: Secondary | ICD-10-CM | POA: Diagnosis not present

## 2012-06-04 DIAGNOSIS — R262 Difficulty in walking, not elsewhere classified: Secondary | ICD-10-CM | POA: Diagnosis not present

## 2012-06-04 DIAGNOSIS — M1710 Unilateral primary osteoarthritis, unspecified knee: Secondary | ICD-10-CM | POA: Diagnosis not present

## 2012-06-06 DIAGNOSIS — M1710 Unilateral primary osteoarthritis, unspecified knee: Secondary | ICD-10-CM | POA: Diagnosis not present

## 2012-06-06 DIAGNOSIS — R262 Difficulty in walking, not elsewhere classified: Secondary | ICD-10-CM | POA: Diagnosis not present

## 2012-08-16 DIAGNOSIS — R7309 Other abnormal glucose: Secondary | ICD-10-CM | POA: Diagnosis not present

## 2012-08-16 DIAGNOSIS — E782 Mixed hyperlipidemia: Secondary | ICD-10-CM | POA: Diagnosis not present

## 2012-08-16 DIAGNOSIS — I10 Essential (primary) hypertension: Secondary | ICD-10-CM | POA: Diagnosis not present

## 2012-08-16 DIAGNOSIS — Z1331 Encounter for screening for depression: Secondary | ICD-10-CM | POA: Diagnosis not present

## 2012-08-16 DIAGNOSIS — M1990 Unspecified osteoarthritis, unspecified site: Secondary | ICD-10-CM | POA: Diagnosis not present

## 2012-08-16 DIAGNOSIS — Z0000 Encounter for general adult medical examination without abnormal findings: Secondary | ICD-10-CM | POA: Diagnosis not present

## 2012-08-26 DIAGNOSIS — Z1211 Encounter for screening for malignant neoplasm of colon: Secondary | ICD-10-CM | POA: Diagnosis not present

## 2012-10-23 ENCOUNTER — Emergency Department (HOSPITAL_COMMUNITY): Payer: Medicare Other

## 2012-10-23 ENCOUNTER — Other Ambulatory Visit: Payer: Self-pay | Admitting: Internal Medicine

## 2012-10-23 ENCOUNTER — Observation Stay (HOSPITAL_COMMUNITY)
Admission: EM | Admit: 2012-10-23 | Discharge: 2012-10-25 | Disposition: A | Discharge status: Home / Self Care | Payer: Medicare Other | Attending: General Surgery | Admitting: General Surgery

## 2012-10-23 ENCOUNTER — Ambulatory Visit
Admission: RE | Admit: 2012-10-23 | Discharge: 2012-10-23 | Disposition: A | Discharge status: Home / Self Care | Payer: Medicare Other | Source: Ambulatory Visit | Attending: Internal Medicine | Admitting: Internal Medicine

## 2012-10-23 ENCOUNTER — Encounter (HOSPITAL_COMMUNITY): Payer: Self-pay | Admitting: *Deleted

## 2012-10-23 DIAGNOSIS — Z79899 Other long term (current) drug therapy: Secondary | ICD-10-CM | POA: Insufficient documentation

## 2012-10-23 DIAGNOSIS — R109 Unspecified abdominal pain: Secondary | ICD-10-CM

## 2012-10-23 DIAGNOSIS — I10 Essential (primary) hypertension: Secondary | ICD-10-CM | POA: Diagnosis not present

## 2012-10-23 DIAGNOSIS — E876 Hypokalemia: Secondary | ICD-10-CM | POA: Diagnosis not present

## 2012-10-23 DIAGNOSIS — R55 Syncope and collapse: Secondary | ICD-10-CM | POA: Diagnosis not present

## 2012-10-23 DIAGNOSIS — E7800 Pure hypercholesterolemia, unspecified: Secondary | ICD-10-CM | POA: Insufficient documentation

## 2012-10-23 DIAGNOSIS — K3580 Unspecified acute appendicitis: Principal | ICD-10-CM | POA: Insufficient documentation

## 2012-10-23 DIAGNOSIS — K37 Unspecified appendicitis: Secondary | ICD-10-CM

## 2012-10-23 DIAGNOSIS — Z01818 Encounter for other preprocedural examination: Secondary | ICD-10-CM | POA: Diagnosis not present

## 2012-10-23 LAB — POCT I-STAT, CHEM 8
BUN: 12 mg/dL (ref 6–23)
Calcium, Ion: 1.06 mmol/L — ABNORMAL LOW (ref 1.13–1.30)
Chloride: 97 meq/L (ref 96–112)
Creatinine, Ser: 0.6 mg/dL (ref 0.50–1.10)
Glucose, Bld: 103 mg/dL — ABNORMAL HIGH (ref 70–99)
HCT: 42 % (ref 36.0–46.0)
Hemoglobin: 14.3 g/dL (ref 12.0–15.0)
Potassium: 2.8 meq/L — ABNORMAL LOW (ref 3.5–5.1)
Sodium: 136 meq/L (ref 135–145)
TCO2: 27 mmol/L (ref 0–100)

## 2012-10-23 LAB — BASIC METABOLIC PANEL
BUN: 14 mg/dL (ref 6–23)
CO2: 25 mEq/L (ref 19–32)
GFR calc non Af Amer: >90 mL/min (ref >90)
Glucose, Bld: 105 mg/dL — ABNORMAL HIGH (ref 70–99)
Potassium: 2.8 mEq/L — ABNORMAL LOW (ref 3.5–5.1)

## 2012-10-23 LAB — CBC WITH DIFFERENTIAL/PLATELET
Eosinophils Absolute: 0 10*3/uL (ref 0.0–0.7)
Hemoglobin: 13.7 g/dL (ref 12.0–15.0)
Lymphocytes Relative: 15 % (ref 12–46)
Lymphs Abs: 1.5 10*3/uL (ref 0.7–4.0)
MCH: 32.6 pg (ref 26.0–34.0)
MCV: 91.2 fL (ref 78.0–100.0)
Monocytes Relative: 8 % (ref 3–12)
Neutrophils Relative %: 76 % (ref 43–77)
RBC: 4.2 MIL/uL (ref 3.87–5.11)
WBC: 9.8 10*3/uL (ref 4.0–10.5)

## 2012-10-23 MED ORDER — HYDROMORPHONE HCL PF 1 MG/ML IJ SOLN: 1.0000 mg | Freq: Once | INTRAMUSCULAR | Status: DC

## 2012-10-23 MED ORDER — IOHEXOL 300 MG/ML  SOLN
100.0000 mL | Freq: Once | INTRAMUSCULAR | Status: AC | PRN
  Administered 2012-10-23: 100 mL via INTRAVENOUS

## 2012-10-23 MED ORDER — HYDROMORPHONE HCL PF 1 MG/ML IJ SOLN
0.5000 mg | Freq: Once | INTRAMUSCULAR | Status: AC
  Administered 2012-10-23: 0.5 mg via INTRAVENOUS
  Filled 2012-10-23: qty 1

## 2012-10-23 MED ORDER — SODIUM CHLORIDE 0.9 % IV BOLUS (SEPSIS)
500.0000 mL | Freq: Once | INTRAVENOUS | Status: AC
  Administered 2012-10-23: 500 mL via INTRAVENOUS

## 2012-10-23 MED ORDER — SODIUM CHLORIDE 0.9 % IV SOLN
INTRAVENOUS | Status: DC
  Administered 2012-10-23: 22:00:00 via INTRAVENOUS

## 2012-10-23 MED ORDER — ONDANSETRON HCL 4 MG/2ML IJ SOLN
4.0000 mg | Freq: Once | INTRAMUSCULAR | Status: AC
  Administered 2012-10-23: 4 mg via INTRAVENOUS
  Filled 2012-10-23: qty 2

## 2012-10-23 NOTE — ED Notes (Signed)
The pt has had rlq pain with nausea since last pm.  The pt was sent here from the scanner with dx of appendicitis

## 2012-10-23 NOTE — ED Provider Notes (Addendum)
CSN: [id number]     Arrival date & time 10/23/12  1844 History   First MD Initiated Contact with Patient 10/23/12 2128     Chief Complaint  Patient presents with  . Abdominal Pain   (Consider location/radiation/quality/duration/timing/severity/associated sxs/prior Treatment) Patient is a 72 y.o. female presenting with abdominal pain. The history is provided by the patient.  Abdominal Pain Associated symptoms: nausea   Associated symptoms: no chest pain, no dysuria, no fever and no shortness of breath    and patient had CT scan done at [facility] imaging referred in by primary care Dr. CT scan showed the appendicitis without evidence of abscess or rupture. Patient blood onset of right lower cautery pain at 3:00 in the morning on September 17. Pain is been constant associated with nausea and dry heave type vomiting. Patient never had any belly surgery in the past. Patient has no history significant for hypertension.  Past Medical History  Diagnosis Date  . Hypertension   . Arthritis   . Hypercholesteremia   . PONV (postoperative nausea and vomiting)    Past Surgical History  Procedure Laterality Date  . Posterior tibial tendon repair  1998  . Bunionectomy  2013  . Hammer toe surgery  2013  . Tonsillectomy   age 79  . Hematoma evacuation  5 years ago  . Knee arthroscopy      right  . Cataract extraction      right  . Total knee arthroplasty Right 03/18/2012    Procedure: TOTAL KNEE ARTHROPLASTY;  Surgeon: [name], MD;  Location: [facility];  Service: Orthopedics;  Laterality: Right;   No family history on file. History  Substance Use Topics  . Smoking status: Former Smoker -- 0.50 packs/day for 20 years    Types: Cigarettes    Quit date: 02/07/2011  . Smokeless tobacco: Never Used  . Alcohol Use: Yes     Comment: 1 drink per day   OB History   Grav Para Term Preterm Abortions TAB SAB Ect Mult Living                 Review of Systems  Constitutional: Negative for  fever.  HENT: Negative for congestion.   Respiratory: Negative for shortness of breath.   Cardiovascular: Negative for chest pain.  Gastrointestinal: Positive for nausea and abdominal pain.  Genitourinary: Negative for dysuria.  Musculoskeletal: Negative for back pain.  Skin: Negative for rash.  Neurological: Negative for headaches.  Hematological: Does not bruise/bleed easily.  Psychiatric/Behavioral: Negative for confusion.    Allergies  Sulfa antibiotics  Home Medications   Current Outpatient Rx  Name  Route  Sig  Dispense  Refill  . acidophilus (RISAQUAD) CAPS   Oral   Take 1 capsule by mouth daily.         Marland Kitchen aspirin 81 MG tablet   Oral   Take 81 mg by mouth daily.         . Calcium Carbonate-Vitamin D (CALCIUM 600 + D PO)   Oral   Take 1 tablet by mouth 2 (two) times daily.         . cholecalciferol (VITAMIN D) 400 UNITS TABS   Oral   Take 400-800 Units by mouth 2 (two) times daily. Pt takes 400 mg at night. 800 mg qam         . diphenhydrAMINE (BENADRYL) 25 mg capsule   Oral   Take 1 capsule (25 mg total) by mouth every 6 (six) hours as needed  for itching, allergies or sleep.   30 capsule      . enalapril (VASOTEC) 10 MG tablet   Oral   Take 10 mg by mouth every morning.         . fish oil-omega-3 fatty acids 1000 MG capsule   Oral   Take 2 g by mouth 2 (two) times daily.         . hydrochlorothiazide (HYDRODIURIL) 25 MG tablet   Oral   Take 25 mg by mouth every morning.         . lovastatin (MEVACOR) 40 MG tablet   Oral   Take 20 mg by mouth at bedtime.         . Multiple Vitamins-Minerals (MULTIVITAMIN WITH MINERALS) tablet   Oral   Take 1 tablet by mouth daily.         . Red Yeast Rice Extract 600 MG CAPS   Oral   Take 600 mg by mouth 2 (two) times daily.          BP 152/61  Pulse 65  Temp(Src) 98.9 F (37.2 C)  Resp 18  Wt 130 lb (58.968 kg)  BMI 21.63 kg/m2  SpO2 97% Physical Exam  Nursing note and vitals  reviewed. Constitutional: She is oriented to person, place, and time. She appears well-developed and well-nourished. No distress.  HENT:  Head: Normocephalic and atraumatic.  Mouth/Throat: Oropharynx is clear and moist.  Eyes: Conjunctivae and EOM are normal. Pupils are equal, round, and reactive to light.  Neck: Normal range of motion.  Cardiovascular: Normal rate, regular rhythm and normal heart sounds.   No murmur heard. Pulmonary/Chest: Effort normal and breath sounds normal. No respiratory distress. She has no wheezes. She has no rales.  Abdominal: Soft. Bowel sounds are normal. She exhibits no mass. There is tenderness. There is guarding.  Tender right lower corner with guarding.  Musculoskeletal: Normal range of motion. She exhibits no edema.  Neurological: She is alert and oriented to person, place, and time. No cranial nerve deficit. She exhibits normal muscle tone. Coordination normal.  Skin: Skin is warm. No rash noted.    ED Course  Procedures (including critical care time) Labs Review Labs Reviewed  BASIC METABOLIC PANEL - Abnormal; Notable for the following:    Sodium 134 (*)    Potassium 2.8 (*)    Glucose, Bld 105 (*)    Creatinine, Ser 0.45 (*)    All other components within normal limits  POCT I-STAT, CHEM 8 - Abnormal; Notable for the following:    Potassium 2.8 (*)    Glucose, Bld 103 (*)    Calcium, Ion 1.06 (*)    All other components within normal limits  CBC WITH DIFFERENTIAL   Results for orders placed during the hospital encounter of 10/23/12  CBC WITH DIFFERENTIAL      Result Value Range   WBC 9.8  4.0 - 10.5 K/uL   RBC 4.20  3.87 - 5.11 MIL/uL   Hemoglobin 13.7  12.0 - 15.0 g/dL   HCT 16.1  09.6 - 04.5 %   MCV 91.2  78.0 - 100.0 fL   MCH 32.6  26.0 - 34.0 pg   MCHC 35.8  30.0 - 36.0 g/dL   RDW 40.9  81.1 - 91.4 %   Platelets 157  150 - 400 K/uL   Neutrophils Relative % 76  43 - 77 %   Neutro Abs 7.5  1.7 - 7.7 K/uL   Lymphocytes Relative 15  12 - 46 %   Lymphs Abs 1.5  0.7 - 4.0 K/uL   Monocytes Relative 8  3 - 12 %   Monocytes Absolute 0.8  0.1 - 1.0 K/uL   Eosinophils Relative 0  0 - 5 %   Eosinophils Absolute 0.0  0.0 - 0.7 K/uL   Basophils Relative 0  0 - 1 %   Basophils Absolute 0.0  0.0 - 0.1 K/uL  BASIC METABOLIC PANEL      Result Value Range   Sodium 134 (*) 135 - 145 mEq/L   Potassium 2.8 (*) 3.5 - 5.1 mEq/L   Chloride 96  96 - 112 mEq/L   CO2 25  19 - 32 mEq/L   Glucose, Bld 105 (*) 70 - 99 mg/dL   BUN 14  6 - 23 mg/dL   Creatinine, Ser 8.11 (*) 0.50 - 1.10 mg/dL   Calcium 8.4  8.4 - 91.4 mg/dL   GFR calc non Af Amer >90  >90 mL/min   GFR calc Af Amer >90  >90 mL/min  POCT I-STAT, CHEM 8      Result Value Range   Sodium 136  135 - 145 mEq/L   Potassium 2.8 (*) 3.5 - 5.1 mEq/L   Chloride 97  96 - 112 mEq/L   BUN 12  6 - 23 mg/dL   Creatinine, Ser 7.82  0.50 - 1.10 mg/dL   Glucose, Bld 956 (*) 70 - 99 mg/dL   Calcium, Ion 2.13 (*) 1.13 - 1.30 mmol/L   TCO2 27  0 - 100 mmol/L   Hemoglobin 14.3  12.0 - 15.0 g/dL   HCT 08.6  57.8 - 46.9 %    Imaging Review Ct Abdomen Pelvis W Contrast  10/23/2012   *RADIOLOGY REPORT*  Clinical Data: Evaluate for appendicitis  CT ABDOMEN AND PELVIS WITH CONTRAST  Technique:  Multidetector CT imaging of the abdomen and pelvis was performed following the standard protocol during bolus administration of intravenous contrast.  Contrast: OMNIPAQUE IOHEXOL 300 MG/ML  SOLN  Comparison: None.  Findings: No pleural effusion identified.  Lung bases are clear. Calcification within the LAD coronary artery noted.  Low attenuation structure within the left hepatic lobe measures 8 mm, image 16/series 2.  No additional suspicious liver abnormalities.  The gallbladder appears normal.  No biliary dilatation.  Normal appearance of the pancreas.  The spleen is unremarkable.  The adrenal glands are both normal.  Normal appearance of the right kidney.  The left kidney is normal.  The urinary  bladder appears normal.  Uterus and adnexal structures are unremarkable.  The stomach is normal.  The small bowel loops have a normal course and caliber without obstruction.  The appendix is abnormally thickened and there is periappendiceal inflammation.  The wall of the appendix appears edematous.  No abscess identified.  No free air. The proximal colon is normal.  Multiple distal colonic diverticula are identified.  No acute inflammation.  Review of the visualized bony structures is significant for mild curvature of the lumbar spine towards the left.  There is multilevel degenerative disc disease noted.  A first-degree anterolisthesis of L4 on L5 is noted.  IMPRESSION:  1.  Examination is positive for acute appendicitis. 2.  No evidence for perforation or abscess.  These results will be called to the ordering clinician or representative by the Radiologist Assistant, and communication documented in the PACS Dashboard.   Original Report Authenticated By: Signa Kell, M.D.   Dg Chest [facility]  10/23/2012   CLINICAL DATA:  Abdominal pain. Preoperative study prior to appendectomy.  EXAM: PORTABLE CHEST - 1 VIEW  COMPARISON:  CHEST x-ray 03/11/2012.  FINDINGS: Lung volumes are normal. No consolidative airspace disease. No pleural effusions. No pneumothorax. No pulmonary nodule or mass noted. Pulmonary vasculature and the cardiomediastinal silhouette are within normal limits. Atherosclerosis in the thoracic aorta.  IMPRESSION: 1.  No radiographic evidence of acute cardiopulmonary disease. 2. Atherosclerosis.   Electronically Signed   By: Trudie Reed M.D.   On: 10/23/2012 22:52    MDM   1. Appendicitis   2. Hypokalemia    Patient's labs without specific findings other than the low potassium. We'll treat with IV potassium in preparation for surgery. Discussed with Dr. Magnus Ivan general surgery he was in her case was done he will make arrangements to take her appendix out. Chest x-ray without any acute  findings. Patient had an EKG done earlier today from her doctor's office which showed no acute findings.    Shelda Jakes, MD 10/24/12 Salley Hews  Shelda Jakes, MD 10/24/12 [phone]

## 2012-10-23 NOTE — ED Notes (Signed)
Blood drawn at ms office and an ekg was done also

## 2012-10-24 ENCOUNTER — Encounter (HOSPITAL_COMMUNITY): Payer: Self-pay | Admitting: Certified Registered"

## 2012-10-24 ENCOUNTER — Encounter (HOSPITAL_COMMUNITY)
Admission: EM | Disposition: A | Discharge status: Home / Self Care | Payer: Self-pay | Source: Home / Self Care | Attending: Emergency Medicine

## 2012-10-24 ENCOUNTER — Observation Stay (HOSPITAL_COMMUNITY): Payer: Medicare Other | Admitting: Certified Registered"

## 2012-10-24 ENCOUNTER — Encounter (HOSPITAL_COMMUNITY): Payer: Self-pay | Admitting: Anesthesiology

## 2012-10-24 DIAGNOSIS — Z79899 Other long term (current) drug therapy: Secondary | ICD-10-CM | POA: Diagnosis not present

## 2012-10-24 DIAGNOSIS — E7800 Pure hypercholesterolemia, unspecified: Secondary | ICD-10-CM | POA: Diagnosis not present

## 2012-10-24 DIAGNOSIS — K3580 Unspecified acute appendicitis: Secondary | ICD-10-CM

## 2012-10-24 DIAGNOSIS — I10 Essential (primary) hypertension: Secondary | ICD-10-CM | POA: Diagnosis not present

## 2012-10-24 HISTORY — PX: LAPAROSCOPIC APPENDECTOMY: SHX408

## 2012-10-24 LAB — BASIC METABOLIC PANEL
BUN: 14 mg/dL (ref 6–23)
GFR calc non Af Amer: >90 mL/min (ref >90)
Glucose, Bld: 100 mg/dL — ABNORMAL HIGH (ref 70–99)
Potassium: 3.1 mEq/L — ABNORMAL LOW (ref 3.5–5.1)

## 2012-10-24 SURGERY — APPENDECTOMY, LAPAROSCOPIC
Anesthesia: General | Site: Abdomen | Wound class: Contaminated

## 2012-10-24 MED ORDER — ARTIFICIAL TEARS OP OINT
TOPICAL_OINTMENT | OPHTHALMIC | Status: DC | PRN
  Administered 2012-10-24: 1 via OPHTHALMIC

## 2012-10-24 MED ORDER — LACTATED RINGERS IV SOLN
Freq: Once | INTRAVENOUS | Status: AC
  Administered 2012-10-24: 10:00:00 via INTRAVENOUS

## 2012-10-24 MED ORDER — ONDANSETRON HCL 4 MG/2ML IJ SOLN: 4.0000 mg | Freq: Once | INTRAMUSCULAR | Status: DC | PRN

## 2012-10-24 MED ORDER — POTASSIUM CHLORIDE 10 MEQ/100ML IV SOLN
10.0000 meq | Freq: Once | INTRAVENOUS | Status: AC
  Administered 2012-10-24: 10 meq via INTRAVENOUS
  Filled 2012-10-24: qty 100

## 2012-10-24 MED ORDER — DEXAMETHASONE SODIUM PHOSPHATE 4 MG/ML IJ SOLN
INTRAMUSCULAR | Status: DC | PRN
  Administered 2012-10-24: 8 mg via INTRAVENOUS

## 2012-10-24 MED ORDER — 0.9 % SODIUM CHLORIDE (POUR BTL) OPTIME
TOPICAL | Status: DC | PRN
  Administered 2012-10-24: 1000 mL

## 2012-10-24 MED ORDER — OXYCODONE-ACETAMINOPHEN 5-325 MG PO TABS
1.0000 | ORAL_TABLET | ORAL | Status: DC | PRN
  Administered 2012-10-24: 1 via ORAL
  Filled 2012-10-24: qty 1

## 2012-10-24 MED ORDER — HYDROMORPHONE HCL PF 1 MG/ML IJ SOLN
0.2500 mg | INTRAMUSCULAR | Status: DC | PRN
Start: 2012-10-24 — End: 2012-10-24

## 2012-10-24 MED ORDER — ENALAPRIL MALEATE 10 MG PO TABS
10.0000 mg | ORAL_TABLET | Freq: Every morning | ORAL | Status: DC
Start: 2012-10-24 — End: 2012-10-25
  Administered 2012-10-24 – 2012-10-25 (×2): 10 mg via ORAL
  Filled 2012-10-24 (×2): qty 1

## 2012-10-24 MED ORDER — NEOSTIGMINE METHYLSULFATE 1 MG/ML IJ SOLN
INTRAMUSCULAR | Status: DC | PRN
  Administered 2012-10-24: 2 mg via INTRAVENOUS

## 2012-10-24 MED ORDER — ROCURONIUM BROMIDE 100 MG/10ML IV SOLN
INTRAVENOUS | Status: DC | PRN
  Administered 2012-10-24: 20 mg via INTRAVENOUS

## 2012-10-24 MED ORDER — ENOXAPARIN SODIUM 40 MG/0.4ML ~~LOC~~ SOLN
40.0000 mg | SUBCUTANEOUS | Status: DC
  Filled 2012-10-24: qty 0.4

## 2012-10-24 MED ORDER — MORPHINE SULFATE 2 MG/ML IJ SOLN: 2.0000 mg | INTRAMUSCULAR | Status: DC | PRN

## 2012-10-24 MED ORDER — LACTATED RINGERS IV SOLN
INTRAVENOUS | Status: DC | PRN
  Administered 2012-10-24: 10:00:00 via INTRAVENOUS

## 2012-10-24 MED ORDER — FENTANYL CITRATE 0.05 MG/ML IJ SOLN
INTRAMUSCULAR | Status: DC | PRN
  Administered 2012-10-24: 50 ug via INTRAVENOUS
  Administered 2012-10-24: 100 ug via INTRAVENOUS
  Administered 2012-10-24: 50 ug via INTRAVENOUS

## 2012-10-24 MED ORDER — BUPIVACAINE-EPINEPHRINE 0.25% -1:200000 IJ SOLN
INTRAMUSCULAR | Status: DC | PRN
  Administered 2012-10-24: 7 mL

## 2012-10-24 MED ORDER — ONDANSETRON HCL 4 MG/2ML IJ SOLN
INTRAMUSCULAR | Status: DC | PRN
  Administered 2012-10-24: 4 mg via INTRAVENOUS

## 2012-10-24 MED ORDER — GLYCOPYRROLATE 0.2 MG/ML IJ SOLN
INTRAMUSCULAR | Status: DC | PRN
  Administered 2012-10-24: 0.3 mg via INTRAVENOUS

## 2012-10-24 MED ORDER — HYDROCHLOROTHIAZIDE 25 MG PO TABS
25.0000 mg | ORAL_TABLET | Freq: Every morning | ORAL | Status: DC
  Administered 2012-10-24 – 2012-10-25 (×2): 25 mg via ORAL
  Filled 2012-10-24 (×2): qty 1

## 2012-10-24 MED ORDER — MORPHINE SULFATE 4 MG/ML IJ SOLN: 4.0000 mg | INTRAMUSCULAR | Status: DC | PRN

## 2012-10-24 MED ORDER — SODIUM CHLORIDE 0.9 % IV SOLN
1.0000 g | INTRAVENOUS | Status: DC
  Administered 2012-10-24: 04:00:00 1 g via INTRAVENOUS
  Filled 2012-10-24: qty 1

## 2012-10-24 MED ORDER — BUPIVACAINE-EPINEPHRINE PF 0.25-1:200000 % IJ SOLN
INTRAMUSCULAR | Status: AC
  Filled 2012-10-24: qty 30

## 2012-10-24 MED ORDER — ALBUTEROL SULFATE HFA 108 (90 BASE) MCG/ACT IN AERS
INHALATION_SPRAY | RESPIRATORY_TRACT | Status: DC | PRN
  Administered 2012-10-24: 2 via RESPIRATORY_TRACT

## 2012-10-24 MED ORDER — LIDOCAINE HCL (CARDIAC) 20 MG/ML IV SOLN
INTRAVENOUS | Status: DC | PRN
  Administered 2012-10-24: 60 mg via INTRAVENOUS

## 2012-10-24 MED ORDER — SODIUM CHLORIDE 0.9 % IR SOLN
Status: DC | PRN
  Administered 2012-10-24: 1

## 2012-10-24 MED ORDER — PROPOFOL 10 MG/ML IV BOLUS
INTRAVENOUS | Status: DC | PRN
  Administered 2012-10-24: 200 mg via INTRAVENOUS

## 2012-10-24 MED ORDER — POTASSIUM CHLORIDE IN NACL 20-0.9 MEQ/L-% IV SOLN
INTRAVENOUS | Status: DC
  Administered 2012-10-24 (×2): via INTRAVENOUS
  Filled 2012-10-24 (×5): qty 1000

## 2012-10-24 MED ORDER — ONDANSETRON HCL 4 MG/2ML IJ SOLN: 4.0000 mg | Freq: Four times a day (QID) | INTRAMUSCULAR | Status: DC | PRN

## 2012-10-24 SURGICAL SUPPLY — 46 items
ADH SKN CLS APL DERMABOND .7 (GAUZE/BANDAGES/DRESSINGS) ×1
ADH SKN CLS LQ APL DERMABOND (GAUZE/BANDAGES/DRESSINGS) ×1
APPLIER CLIP ROT 10 11.4 M/L (STAPLE)
APR CLP MED LRG 11.4X10 (STAPLE)
BAG SPEC RTRVL LRG 6X4 10 (ENDOMECHANICALS) ×1
CANISTER SUCTION 2500CC (MISCELLANEOUS) ×2 IMPLANT
CHLORAPREP W/TINT 26ML (MISCELLANEOUS) ×2 IMPLANT
CLIP APPLIE ROT 10 11.4 M/L (STAPLE) IMPLANT
CLOTH BEACON ORANGE TIMEOUT ST (SAFETY) ×2 IMPLANT
COVER SURGICAL LIGHT HANDLE (MISCELLANEOUS) ×2 IMPLANT
CUTTER FLEX LINEAR 45M (STAPLE) ×2 IMPLANT
DERMABOND ADHESIVE PROPEN (GAUZE/BANDAGES/DRESSINGS) ×1
DERMABOND ADVANCED (GAUZE/BANDAGES/DRESSINGS) ×1
DERMABOND ADVANCED .7 DNX12 (GAUZE/BANDAGES/DRESSINGS) ×1 IMPLANT
DERMABOND ADVANCED .7 DNX6 (GAUZE/BANDAGES/DRESSINGS) IMPLANT
ELECT REM PT RETURN 9FT ADLT (ELECTROSURGICAL) ×2
ELECTRODE REM PT RTRN 9FT ADLT (ELECTROSURGICAL) ×1 IMPLANT
GLOVE BIO SURGEON STRL SZ7 (GLOVE) ×2 IMPLANT
GLOVE BIOGEL PI IND STRL 7.0 (GLOVE) IMPLANT
GLOVE BIOGEL PI IND STRL 7.5 (GLOVE) ×1 IMPLANT
GLOVE BIOGEL PI INDICATOR 7.0 (GLOVE) ×1
GLOVE BIOGEL PI INDICATOR 7.5 (GLOVE) ×1
GLOVE SURG SS PI 7.0 STRL IVOR (GLOVE) ×2 IMPLANT
GOWN STRL NON-REIN LRG LVL3 (GOWN DISPOSABLE) ×6 IMPLANT
KIT BASIN OR (CUSTOM PROCEDURE TRAY) ×2 IMPLANT
KIT ROOM TURNOVER OR (KITS) ×2 IMPLANT
NS IRRIG 1000ML POUR BTL (IV SOLUTION) ×2 IMPLANT
PAD ARMBOARD 7.5X6 YLW CONV (MISCELLANEOUS) ×4 IMPLANT
POUCH SPECIMEN RETRIEVAL 10MM (ENDOMECHANICALS) ×2 IMPLANT
RELOAD 45 VASCULAR/THIN (ENDOMECHANICALS) ×2 IMPLANT
RELOAD STAPLE 45 2.5 WHT GRN (ENDOMECHANICALS) ×1 IMPLANT
RELOAD STAPLE 45 3.5 BLU ETS (ENDOMECHANICALS) IMPLANT
RELOAD STAPLE TA45 3.5 REG BLU (ENDOMECHANICALS) IMPLANT
SCALPEL HARMONIC ACE (MISCELLANEOUS) ×2 IMPLANT
SCISSORS LAP 5X35 DISP (ENDOMECHANICALS) IMPLANT
SET IRRIG TUBING LAPAROSCOPIC (IRRIGATION / IRRIGATOR) ×2 IMPLANT
SLEEVE ENDOPATH XCEL 5M (ENDOMECHANICALS) ×2 IMPLANT
SPECIMEN JAR SMALL (MISCELLANEOUS) ×2 IMPLANT
STRIP CLOSURE SKIN 1/2X4 (GAUZE/BANDAGES/DRESSINGS) ×1 IMPLANT
SUT MNCRL AB 4-0 PS2 18 (SUTURE) ×2 IMPLANT
TOWEL OR 17X24 6PK STRL BLUE (TOWEL DISPOSABLE) ×2 IMPLANT
TOWEL OR 17X26 10 PK STRL BLUE (TOWEL DISPOSABLE) ×2 IMPLANT
TRAY FOLEY CATH 16FR SILVER (SET/KITS/TRAYS/PACK) ×2 IMPLANT
TRAY LAPAROSCOPIC (CUSTOM PROCEDURE TRAY) ×2 IMPLANT
TROCAR XCEL BLUNT TIP 100MML (ENDOMECHANICALS) ×2 IMPLANT
TROCAR XCEL NON-BLD 5MMX100MML (ENDOMECHANICALS) ×2 IMPLANT

## 2012-10-24 NOTE — Transfer of Care (Signed)
Immediate Anesthesia Transfer of Care Note  Patient: Sarah Castro  Procedure(s) Performed: Procedure(s): APPENDECTOMY LAPAROSCOPIC (N/A)  Patient Location: PACU  Anesthesia Type:General  Level of Consciousness: awake and oriented  Airway & Oxygen Therapy: Patient Spontanous Breathing and Patient connected to nasal cannula oxygen  Post-op Assessment: Report given to PACU RN  Post vital signs: Reviewed and stable  Complications: No apparent anesthesia complications

## 2012-10-24 NOTE — Progress Notes (Signed)
Subjective: Still with rlq pain  Objective: Vital signs in last 24 hours: Temp:  [98.3 F (36.8 C)-98.9 F (37.2 C)] 98.3 F (36.8 C) (09/18 0728) Pulse Rate:  [63-84] 63 (09/18 0728) Resp:  [14-18] 14 (09/18 0728) BP: (128-155)/(56-68) 133/58 mmHg (09/18 0728) SpO2:  [94 %-98 %] 96 % (09/18 0728) Weight:  [130 lb (58.968 kg)] 130 lb (58.968 kg) (09/17 1854) Last BM Date: 10/23/12  Intake/Output from previous day:   Intake/Output this shift: Total I/O In: 326.7 [I.V.:326.7] Out: -   GI: soft, tender rlq  Lab Results:   Recent Labs  10/23/12 2214 10/23/12 2325  WBC 9.8  --   HGB 13.7 14.3  HCT 38.3 42.0  PLT 157  --    BMET  Recent Labs  10/23/12 2214 10/23/12 2325 10/24/12 0526  NA 134* 136 135  K 2.8* 2.8* 3.1*  CL 96 97 97  CO2 25  --  28  GLUCOSE 105* 103* 100*  BUN 14 12 14   CREATININE 0.45* 0.60 0.52  CALCIUM 8.4  --  8.8   PT/INR No results found for this basename: LABPROT, INR,  in the last 72 hours ABG No results found for this basename: PHART, PCO2, PO2, HCO3,  in the last 72 hours  Studies/Results: Ct Abdomen Pelvis W Contrast  10/23/2012   *RADIOLOGY REPORT*  Clinical Data: Evaluate for appendicitis  CT ABDOMEN AND PELVIS WITH CONTRAST  Technique:  Multidetector CT imaging of the abdomen and pelvis was performed following the standard protocol during bolus administration of intravenous contrast.  Contrast: OMNIPAQUE IOHEXOL 300 MG/ML  SOLN  Comparison: None.  Findings: No pleural effusion identified.  Lung bases are clear. Calcification within the LAD coronary artery noted.  Low attenuation structure within the left hepatic lobe measures 8 mm, image 16/series 2.  No additional suspicious liver abnormalities.  The gallbladder appears normal.  No biliary dilatation.  Normal appearance of the pancreas.  The spleen is unremarkable.  The adrenal glands are both normal.  Normal appearance of the right kidney.  The left kidney is normal.  The  urinary bladder appears normal.  Uterus and adnexal structures are unremarkable.  The stomach is normal.  The small bowel loops have a normal course and caliber without obstruction.  The appendix is abnormally thickened and there is periappendiceal inflammation.  The wall of the appendix appears edematous.  No abscess identified.  No free air. The proximal colon is normal.  Multiple distal colonic diverticula are identified.  No acute inflammation.  Review of the visualized bony structures is significant for mild curvature of the lumbar spine towards the left.  There is multilevel degenerative disc disease noted.  A first-degree anterolisthesis of L4 on L5 is noted.  IMPRESSION:  1.  Examination is positive for acute appendicitis. 2.  No evidence for perforation or abscess.  These results will be called to the ordering clinician or representative by the Radiologist Assistant, and communication documented in the PACS Dashboard.   Original Report Authenticated By: Signa Kell, M.D.   Dg Chest Port 1 View  10/23/2012   CLINICAL DATA:  Abdominal pain. Preoperative study prior to appendectomy.  EXAM: PORTABLE CHEST - 1 VIEW  COMPARISON:  CHEST x-ray 03/11/2012.  FINDINGS: Lung volumes are normal. No consolidative airspace disease. No pleural effusions. No pneumothorax. No pulmonary nodule or mass noted. Pulmonary vasculature and the cardiomediastinal silhouette are within normal limits. Atherosclerosis in the thoracic aorta.  IMPRESSION: 1.  No radiographic evidence of acute  cardiopulmonary disease. 2. Atherosclerosis.   Electronically Signed   By: Trudie Reed M.D.   On: 10/23/2012 22:52    Anti-infectives: Anti-infectives   Start     Dose/Rate Route Frequency Ordered Stop   10/24/12 0400  ertapenem (INVANZ) 1 g in sodium chloride 0.9 % 50 mL IVPB     1 g 100 mL/hr over 30 Minutes Intravenous Every 24 hours 10/24/12 0214        Assessment/Plan: Appendicitis Plan lap appy today, discussed  risks/benefits, discussed need for colonoscopy postop but she is hesitant to do as her mom apparently passed from a perforation  [facility] 10/24/2012

## 2012-10-24 NOTE — Preoperative (Signed)
Beta Blockers   Reason not to administer Beta Blockers:Not Applicable 

## 2012-10-24 NOTE — Anesthesia Preprocedure Evaluation (Addendum)
Anesthesia Evaluation  Patient identified by MRN, date of birth, ID band Patient awake    Reviewed: Allergy & Precautions, H&P , NPO status , Patient's Chart, lab work & pertinent test results  History of Anesthesia Complications (+) PONV  Airway Mallampati: I TM Distance: >3 FB Neck ROM: full    Dental  (+) Teeth Intact and Dental Advisory Given   Pulmonary          Cardiovascular hypertension, Rhythm:regular Rate:Normal     Neuro/Psych    GI/Hepatic   Endo/Other    Renal/GU      Musculoskeletal   Abdominal   Peds  Hematology   Anesthesia Other Findings   Reproductive/Obstetrics                          Anesthesia Physical Anesthesia Plan  ASA: II  Anesthesia Plan: General   Post-op Pain Management:    Induction: Intravenous  Airway Management Planned: Oral ETT  Additional Equipment:   Intra-op Plan:   Post-operative Plan: Extubation in OR  Informed Consent: I have reviewed the patients History and Physical, chart, labs and discussed the procedure including the risks, benefits and alternatives for the proposed anesthesia with the patient or authorized representative who has indicated his/her understanding and acceptance.     Plan Discussed with: CRNA, Anesthesiologist and Surgeon  Anesthesia Plan Comments:         Anesthesia Quick Evaluation

## 2012-10-24 NOTE — H&P (Signed)
Sarah Castro is an 72 y.o. female.   Chief Complaint: Right lower quadrant abdominal pain HPI: This is a pleasant 72 year old female who awoke at 3 AM yesterday with right-sided abdominal pain. It has slowly progressed throughout the day. She also had some nausea and dry heaves. She saw her primary care physician. A CAT scan of the abdomen and pelvis showed acute appendicitis therefore she was sent to the emergency department. Currently she reports her pain is very mild and sharp in the right lower quadrant. She otherwise feels well and denies fevers  Past Medical History  Diagnosis Date  . Hypertension   . Arthritis   . Hypercholesteremia   . PONV (postoperative nausea and vomiting)     Past Surgical History  Procedure Laterality Date  . Posterior tibial tendon repair  1998  . Bunionectomy  2013  . Hammer toe surgery  2013  . Tonsillectomy   age 3  . Hematoma evacuation  5 years ago  . Knee arthroscopy      right  . Cataract extraction      right  . 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 Social History:  reports that she quit smoking about 20 months ago. Her smoking use included Cigarettes. She has a 10 pack-year smoking history. She has never used smokeless tobacco. She reports that  drinks alcohol. She reports that she does not use illicit drugs.  Allergies:  Allergies  Allergen Reactions  . Sulfa Antibiotics Rash     (Not in a hospital admission)  Results for orders placed during the hospital encounter of 10/23/12 (from the past 48 hour(s))  CBC WITH DIFFERENTIAL     Status: None   Collection Time    10/23/12 10:14 PM      Result Value Range   WBC 9.8  4.0 - 10.5 K/uL   RBC 4.20  3.87 - 5.11 MIL/uL   Hemoglobin 13.7  12.0 - 15.0 g/dL   HCT 40.9  81.1 - 91.4 %   MCV 91.2  78.0 - 100.0 fL   MCH 32.6   26.0 - 34.0 pg   MCHC 35.8  30.0 - 36.0 g/dL   RDW 78.2  95.6 - 21.3 %   Platelets 157  150 - 400 K/uL   Neutrophils Relative % 76  43 - 77 %   Neutro Abs 7.5  1.7 - 7.7 K/uL   Lymphocytes Relative 15  12 - 46 %   Lymphs Abs 1.5  0.7 - 4.0 K/uL   Monocytes Relative 8  3 - 12 %   Monocytes Absolute 0.8  0.1 - 1.0 K/uL   Eosinophils Relative 0  0 - 5 %   Eosinophils Absolute 0.0  0.0 - 0.7 K/uL   Basophils Relative 0  0 - 1 %   Basophils Absolute 0.0  0.0 - 0.1 K/uL  BASIC METABOLIC PANEL     Status: Abnormal   Collection Time    10/23/12 10:14 PM      Result Value Range   Sodium 134 (*) 135 - 145 mEq/L   Potassium 2.8 (*) 3.5 - 5.1 mEq/L   Chloride 96  96 - 112 mEq/L   CO2 25  19 - 32 mEq/L   Glucose, Bld 105 (*) 70 - 99 mg/dL   BUN 14  6 -  23 mg/dL   Creatinine, Ser 1.61 (*) 0.50 - 1.10 mg/dL   Calcium 8.4  8.4 - 09.6 mg/dL   GFR calc non Af Amer >90  >90 mL/min   GFR calc Af Amer >90  >90 mL/min   Comment: (NOTE)     The eGFR has been calculated using the CKD EPI equation.     This calculation has not been validated in all clinical situations.     eGFR's persistently <90 mL/min signify possible Chronic Kidney     Disease.  POCT I-STAT, CHEM 8     Status: Abnormal   Collection Time    10/23/12 11:25 PM      Result Value Range   Sodium 136  135 - 145 mEq/L   Potassium 2.8 (*) 3.5 - 5.1 mEq/L   Chloride 97  96 - 112 mEq/L   BUN 12  6 - 23 mg/dL   Creatinine, Ser 0.45  0.50 - 1.10 mg/dL   Glucose, Bld 409 (*) 70 - 99 mg/dL   Calcium, Ion 8.11 (*) 1.13 - 1.30 mmol/L   TCO2 27  0 - 100 mmol/L   Hemoglobin 14.3  12.0 - 15.0 g/dL   HCT 91.4  78.2 - 95.6 %   Ct Abdomen Pelvis W Contrast  10/23/2012   *RADIOLOGY REPORT*  Clinical Data: Evaluate for appendicitis  CT ABDOMEN AND PELVIS WITH CONTRAST  Technique:  Multidetector CT imaging of the abdomen and pelvis was performed following the standard protocol during bolus administration of intravenous contrast.  Contrast:  OMNIPAQUE IOHEXOL 300 MG/ML  SOLN  Comparison: None.  Findings: No pleural effusion identified.  Lung bases are clear. Calcification within the LAD coronary artery noted.  Low attenuation structure within the left hepatic lobe measures 8 mm, image 16/series 2.  No additional suspicious liver abnormalities.  The gallbladder appears normal.  No biliary dilatation.  Normal appearance of the pancreas.  The spleen is unremarkable.  The adrenal glands are both normal.  Normal appearance of the right kidney.  The left kidney is normal.  The urinary bladder appears normal.  Uterus and adnexal structures are unremarkable.  The stomach is normal.  The small bowel loops have a normal course and caliber without obstruction.  The appendix is abnormally thickened and there is periappendiceal inflammation.  The wall of the appendix appears edematous.  No abscess identified.  No free air. The proximal colon is normal.  Multiple distal colonic diverticula are identified.  No acute inflammation.  Review of the visualized bony structures is significant for mild curvature of the lumbar spine towards the left.  There is multilevel degenerative disc disease noted.  A first-degree anterolisthesis of L4 on L5 is noted.  IMPRESSION:  1.  Examination is positive for acute appendicitis. 2.  No evidence for perforation or abscess.  These results will be called to the ordering clinician or representative by the Radiologist Assistant, and communication documented in the PACS Dashboard.   Original Report Authenticated By: Signa Kell, M.D.   Dg Chest Port 1 View  10/23/2012   CLINICAL DATA:  Abdominal pain. Preoperative study prior to appendectomy.  EXAM: PORTABLE CHEST - 1 VIEW  COMPARISON:  CHEST x-ray 03/11/2012.  FINDINGS: Lung volumes are normal. No consolidative airspace disease. No pleural effusions. No pneumothorax. No pulmonary nodule or mass noted. Pulmonary vasculature and the cardiomediastinal silhouette are within normal limits.  Atherosclerosis in the thoracic aorta.  IMPRESSION: 1.  No radiographic evidence of acute cardiopulmonary disease. 2. Atherosclerosis.  Electronically Signed   By: Trudie Reed M.D.   On: 10/23/2012 22:52    Review of Systems  Constitutional: Negative.   HENT: Negative.   Eyes: Negative.   Respiratory: Negative.   Cardiovascular: Negative.   Gastrointestinal: Positive for nausea and abdominal pain.  Genitourinary: Negative.   Musculoskeletal: Positive for joint pain.  Skin: Negative.   Neurological: Negative.   Psychiatric/Behavioral: Negative.     Blood pressure 128/59, pulse 65, temperature 98.9 F (37.2 C), resp. rate 18, weight 130 lb (58.968 kg), SpO2 94.00%. Physical Exam  Constitutional: She is oriented to person, place, and time. She appears well-developed and well-nourished. No distress.  Very comfortable in appearance  HENT:  Head: Normocephalic and atraumatic.  Right Ear: External ear normal.  Left Ear: External ear normal.  Eyes: Conjunctivae are normal. Pupils are equal, round, and reactive to light. Right eye exhibits no discharge. Left eye exhibits no discharge.  Neck: Normal range of motion. Neck supple. No tracheal deviation present.  Cardiovascular: Normal rate, regular rhythm, normal heart sounds and intact distal pulses.   No murmur heard. Respiratory: Effort normal and breath sounds normal. No respiratory distress. She has no wheezes.  GI: Soft. Bowel sounds are normal. She exhibits no distension.  Mild tenderness with guarding which is also mild in the right lower quadrant  Musculoskeletal: Normal range of motion. She exhibits no edema and no tenderness.  Well-healed incision over right knee  Lymphadenopathy:    She has no cervical adenopathy.  Neurological: She is alert and oriented to person, place, and time.  Skin: Skin is warm and dry. No rash noted. She is not diaphoretic. No erythema.  Psychiatric: Her behavior is normal. Judgment normal.      Assessment/Plan Early acute appendicitis  Will admit her to the hospital, start her on IV antibiotics, and plan a laparoscopic appendectomy later today. I briefly discussed the surgery with her including the risks. She understands and wishes to proceed.  Arnika Larzelere A 10/24/2012, 1:02 AM

## 2012-10-24 NOTE — Anesthesia Postprocedure Evaluation (Signed)
  Anesthesia Post-op Note  Patient: Sarah Castro  Procedure(s) Performed: Procedure(s): APPENDECTOMY LAPAROSCOPIC (N/A)  Patient Location: PACU  Anesthesia Type:General  Level of Consciousness: awake, alert , oriented and patient cooperative  Airway and Oxygen Therapy: Patient Spontanous Breathing  Post-op Pain: mild  Post-op Assessment: Post-op Vital signs reviewed, Patient's Cardiovascular Status Stable, Respiratory Function Stable, Patent Airway, No signs of Nausea or vomiting and Pain level controlled  Post-op Vital Signs: stable  Complications: No apparent anesthesia complications

## 2012-10-24 NOTE — Anesthesia Procedure Notes (Signed)
Procedure Name: Intubation Date/Time: 10/24/2012 10:10 AM Performed by: Jefm Miles E Pre-anesthesia Checklist: Patient identified, Timeout performed, Emergency Drugs available, Suction available and Patient being monitored Patient Re-evaluated:Patient Re-evaluated prior to inductionOxygen Delivery Method: Circle system utilized Preoxygenation: Pre-oxygenation with 100% oxygen Intubation Type: IV induction Ventilation: Mask ventilation without difficulty and Oral airway inserted - appropriate to patient size Laryngoscope Size: Mac and 3 Grade View: Grade I Tube type: Oral Number of attempts: 1 Airway Equipment and Method: Stylet Placement Confirmation: ETT inserted through vocal cords under direct vision,  positive ETCO2 and breath sounds checked- equal and bilateral Secured at: 22 cm Tube secured with: Tape Dental Injury: Teeth and Oropharynx as per pre-operative assessment

## 2012-10-24 NOTE — Op Note (Signed)
Preoperative diagnosis: Acute appendicitis Postoperative diagnosis: Same as above Procedure: Laparoscopic appendectomy Surgeon: Dr. Harden Mo Anesthesia: Gen. Estimated blood loss: Minimal Specimens: Appendix to pathology Drains: None Complications: None Sponge and needle count correct at completion Disposition to recovery stable  Indications: This is a 72 year old female who presents over night with right lower quadrant pain and a CT scan consistent with acute appendicitis. She was admitted by one of my partners. I saw her the following morning discussed proceeding to the operating room with a laparoscopic appendectomy. The risks and benefits were discussed prior to beginning.  Procedure: After informed consent was obtained the patient was taken to the operating room. She had been administered ertapenem prior to beginning. She had sequential compression devices on her legs. She was then placed under general anesthesia without complication. A Foley catheter was placed. Her abdomen was then prepped and draped in the standard sterile surgical fashion. A surgical timeout was performed.  I injected Marcaine below the umbilicus and made a vertical incision. I made a incision in the fascia and entered into the peritoneum bluntly. I placed a 0 Vicryl pursestring suture through the fascia. I then inserted a Hassan trocar and insufflated the abdomen to 15 mm mercury pressure. I then inserted 2 further 5 mm trocars under direct vision after infiltration of local anesthetic in the suprapubic region and left lower quadrant. I then noted some fluid in the right lower quadrant. The appendix is noted to be acutely suppurative. I then was able to rotate the appendix from its position was was somewhat retrocecal. I then was able to begin dividing the appendiceal mesentery. This was done with the harmonic scalpel. I was then able to encircle the base of the appendix. There was some bleeding during this portion  which are controlled with the harmonic scalpel. I then divided the base of the appendix. A good portion of the appendix was very adherent to the cecum and I took this down a combination of blunt dissection as well as the harmonic scalpel. I took care to avoid the cecum. There was a lot of inflammation in this area. I then eventually remove the appendix and placed in an Endo Catch bag. This was removed from the umbilicus. I then insured that hemostasis was obtained. I inspected the base and this was clean. I inspected the cecum several times and there was no evidence of injury during removal of the appendix. The cecum all appeared intact. The small bowel was also normal appearing. I then removed all my trocars are after evacuating the fluid. I tied my umbilical stitch down and this completely obliterated the defect. I then closed using 4-0 Monocryl and Dermabond. She tolerated this well was extubated and transferred to recovery stable.

## 2012-10-24 NOTE — Progress Notes (Signed)
Spoke with Dr. Magnus Ivan regarding K+ level and surgery tomorrow; hold tonight's dose of Lovenox & do not need to monitor patient on telemetry.

## 2012-10-25 MED ORDER — OXYCODONE-ACETAMINOPHEN 5-325 MG PO TABS: 1.0000 | ORAL_TABLET | ORAL | Status: DC | PRN

## 2012-10-25 NOTE — Care Management Note (Signed)
    Page 1 of 1   10/25/2012     4:08:30 PM   CARE MANAGEMENT NOTE 10/25/2012  Patient:  Sarah Castro, Sarah Castro   Account Number:  [id number]  Date Initiated:  10/25/2012  Documentation initiated by:  Letha Cape  Subjective/Objective Assessment:   dx appendicities, s/p lap appy  admit- lives with spouse, pta indep.     Action/Plan:   Anticipated DC Date:  10/25/2012   Anticipated DC Plan:        DC Planning Services  CM consult      Choice offered to / List presented to:             Status of service:  Completed, signed off Medicare Important Message given?   (If response is "NO", the following Medicare IM given date fields will be blank) Date Medicare IM given:   Date Additional Medicare IM given:    Discharge Disposition:  HOME/SELF CARE  Per UR Regulation:  Reviewed for med. necessity/level of care/duration of stay  If discussed at Long Length of Stay Meetings, dates discussed:    Comments:  10/25/12 16:07 Letha Cape RN, BSN [phone] patient s/p lap appy, dc to home. no needs anticipated.

## 2012-10-25 NOTE — Discharge Instructions (Signed)
[facility] ______[facility] °LAPAROSCOPIC SURGERY: POST OP INSTRUCTIONS °Always review your discharge instruction sheet given to you by the facility where your surgery was performed. °IF YOU HAVE DISABILITY OR FAMILY LEAVE FORMS, YOU MUST BRING THEM TO THE OFFICE FOR PROCESSING.   °DO NOT GIVE THEM TO YOUR DOCTOR. ° °1. A prescription for pain medication may be given to you upon discharge.  Take your pain medication as prescribed, if needed.  If narcotic pain medicine is not needed, then you may take acetaminophen (Tylenol) or ibuprofen (Advil) as needed. °2. Take your usually prescribed medications unless otherwise directed. °3. If you need a refill on your pain medication, please contact your pharmacy.  They will contact our office to request authorization. Prescriptions will not be filled after 5pm or on week-ends. °4. You should follow a light diet the first few days after arrival home, such as soup and crackers, etc.  Be sure to include lots of fluids daily. °5. Most patients will experience some swelling and bruising in the area of the incisions.  Ice packs will help.  Swelling and bruising can take several days to resolve.  °6. It is common to experience some constipation if taking pain medication after surgery.  Increasing fluid intake and taking a stool softener (such as Colace) will usually help or prevent this problem from occurring.  A mild laxative (Milk of Magnesia or Miralax) should be taken according to package instructions if there are no bowel movements after 48 hours. °7. Unless discharge instructions indicate otherwise, you may remove your bandages 24-48 hours after surgery, and you may shower at that time.  You may have steri-strips (small skin tapes) in place directly over the incision.  These strips should be left on the skin for 7-10 days.  If your surgeon used skin glue on the incision, you may shower in 24 hours.  The glue will flake off over the next 2-3 weeks.  Any sutures or  staples will be removed at the office during your follow-up visit. °8. ACTIVITIES:  You may resume regular (light) daily activities beginning the next day--such as daily self-care, walking, climbing stairs--gradually increasing activities as tolerated.  You may have sexual intercourse when it is comfortable.  Refrain from any heavy lifting or straining until approved by your doctor. °a. You may drive when you are no longer taking prescription pain medication, you can comfortably wear a seatbelt, and you can safely maneuver your car and apply brakes. °b. RETURN TO WORK:  __________________________________________________________ °9. You should see your doctor in the office for a follow-up appointment approximately 2-3 weeks after your surgery.  Make sure that you call for this appointment within a day or two after you arrive home to insure a convenient appointment time. °10. OTHER INSTRUCTIONS: __________________________________________________________________________________________________________________________ __________________________________________________________________________________________________________________________ °WHEN TO CALL YOUR DOCTOR: °1. Fever over 101.0 °2. Inability to urinate °3. Continued bleeding from incision. °4. Increased pain, redness, or drainage from the incision. °5. Increasing abdominal pain ° °The clinic staff is available to answer your questions during regular business hours.  Please don’t hesitate to call and ask to speak to one of the nurses for clinical concerns.  If you have a medical emergency, go to the nearest emergency room or call 911.  A surgeon from [facility] is always on call at the hospital. °[address], [address], [place], [place]  [address] ? [address] [address], [place],    [address] °[phone] ? [phone] ? FAX [phone] °Web site:   [email or web address]  Recommend outpatient colonoscopy

## 2012-10-25 NOTE — Progress Notes (Signed)
Medical and post-op instructions reviewed with patient. Prescription sent with patient. PIV removed. Belongings returned to patient. Pt to be discharged to home with husband.   Sarah Castro, Arshia Spellman Gordon

## 2012-10-25 NOTE — Discharge Summary (Signed)
Agree with above 

## 2012-10-25 NOTE — Discharge Summary (Signed)
  Patient ID: NIOMA MCCUBBINS MRN: [id number] DOB/AGE: [birth year] 72 y.o.  Admit date: 10/23/2012 Discharge date: 10/25/2012  Procedures: lap appy  Consults: None  Reason for Admission: This is a pleasant 72 year old female who awoke at 3 AM yesterday with right-sided abdominal pain. It has slowly progressed throughout the day. She also had some nausea and dry heaves. She saw her primary care physician. A CAT scan of the abdomen and pelvis showed acute appendicitis therefore she was sent to the emergency department. Currently she reports her pain is very mild and sharp in the right lower quadrant. She otherwise feels well and denies fevers  Admission Diagnoses:  1. Acute appendicitis 2. HTN  Hospital Course: the patient was admitted and taken to the OR for a lap appy.  She tolerated the procedure well, and on POD 1 was tolerating a regular diet and her pain was well controlled.  She was stable for dc home.  PE: Abd: soft, minimally tender, incisions c/d/i, +BS, minimal distention  Discharge Diagnoses:  1. Acute appendicitis, s/p lap appy 2. HTN  Discharge Medications:   Medication List         acidophilus Caps capsule  Take 1 capsule by mouth daily.     aspirin 81 MG tablet  Take 81 mg by mouth daily.     CALCIUM 600 + D PO  Take 1 tablet by mouth 2 (two) times daily.     cholecalciferol 400 UNITS Tabs tablet  Commonly known as:  VITAMIN D  Take 400-800 Units by mouth 2 (two) times daily. Pt takes 400 mg at night. 800 mg qam     diphenhydrAMINE 25 mg capsule  Commonly known as:  BENADRYL  Take 1 capsule (25 mg total) by mouth every 6 (six) hours as needed for itching, allergies or sleep.     enalapril 10 MG tablet  Commonly known as:  VASOTEC  Take 10 mg by mouth every morning.     fish oil-omega-3 fatty acids 1000 MG capsule  Take 2 g by mouth 2 (two) times daily.     hydrochlorothiazide 25 MG tablet  Commonly known as:  HYDRODIURIL  Take 25 mg by mouth  every morning.     lovastatin 40 MG tablet  Commonly known as:  MEVACOR  Take 20 mg by mouth at bedtime.     multivitamin with minerals tablet  Take 1 tablet by mouth daily.     oxyCODONE-acetaminophen 5-325 MG per tablet  Commonly known as:  PERCOCET/ROXICET  Take 1-2 tablets by mouth every 4 (four) hours as needed.     Red Yeast Rice Extract 600 MG Caps  Take 600 mg by mouth 2 (two) times daily.        Discharge Instructions:     Follow-up Information   Follow up with [facility] On 11/12/2012. (1:30, arrive at 1:00pm for paperwork)    Contact information:   [address]   [place] [address] [phone]       Signed: Letha Cape 10/25/2012, 9:06 AM

## 2012-10-27 ENCOUNTER — Encounter (HOSPITAL_COMMUNITY): Payer: Self-pay | Admitting: General Surgery

## 2012-10-28 DIAGNOSIS — H35729 Serous detachment of retinal pigment epithelium, unspecified eye: Secondary | ICD-10-CM | POA: Diagnosis not present

## 2012-10-28 DIAGNOSIS — H35369 Drusen (degenerative) of macula, unspecified eye: Secondary | ICD-10-CM | POA: Diagnosis not present

## 2012-10-28 DIAGNOSIS — H353290 Exudative age-related macular degeneration, unspecified eye, stage unspecified: Secondary | ICD-10-CM | POA: Diagnosis not present

## 2012-10-30 DIAGNOSIS — H35369 Drusen (degenerative) of macula, unspecified eye: Secondary | ICD-10-CM | POA: Diagnosis not present

## 2012-10-30 DIAGNOSIS — H353290 Exudative age-related macular degeneration, unspecified eye, stage unspecified: Secondary | ICD-10-CM | POA: Diagnosis not present

## 2012-11-05 DIAGNOSIS — Z23 Encounter for immunization: Secondary | ICD-10-CM | POA: Diagnosis not present

## 2012-11-12 ENCOUNTER — Encounter (INDEPENDENT_AMBULATORY_CARE_PROVIDER_SITE_OTHER): Payer: Medicare Other

## 2012-11-13 ENCOUNTER — Encounter: Payer: Self-pay | Admitting: Gastroenterology

## 2012-11-19 ENCOUNTER — Encounter (INDEPENDENT_AMBULATORY_CARE_PROVIDER_SITE_OTHER): Payer: Self-pay | Admitting: General Surgery

## 2012-11-19 ENCOUNTER — Ambulatory Visit (INDEPENDENT_AMBULATORY_CARE_PROVIDER_SITE_OTHER): Payer: Medicare Other | Admitting: General Surgery

## 2012-11-19 VITALS — BP 120/82 | HR 84 | Temp 99.0°F | Resp 14 | Ht 65.0 in | Wt 133.4 lb

## 2012-11-19 DIAGNOSIS — K3580 Unspecified acute appendicitis: Secondary | ICD-10-CM

## 2012-11-19 NOTE — Patient Instructions (Signed)
Call if you have a problem. 

## 2012-11-19 NOTE — Progress Notes (Signed)
AHMYA BERNICK 1940-05-29 [id number] 11/19/2012   History of Present Illness: Sarah Castro is a  72 y.o. female who presents today status post lap appy by Dr.Matthew Dwain Sarna, MD  .  Pathology revealsAppendix, Other than Incidental - ACUTE FULL THICKNESS APPENDICITIS AND SEROSITIS. - NO TUMOR SEEN.  The patient is tolerating a regular diet, having normal bowel movements, has good pain control.  She  is back to most normal activities.   Physical Exam: BP 120/82  Pulse 84  Temp(Src) 99 F (37.2 C) (Temporal)  Resp 14  Ht 5\' 5"  (1.651 m)  Wt 60.51 kg (133 lb 6.4 oz)  BMI 22.2 kg/m2  Abd: soft, nontender, active bowel sounds, nondistended.  All incisions are well healed.   Impression: 1.  Acute appendicitis, s/p lap appy  Plan: She  is able to return to normal activities. She  may follow up on a prn basis.

## 2012-11-26 ENCOUNTER — Encounter (INDEPENDENT_AMBULATORY_CARE_PROVIDER_SITE_OTHER): Payer: Medicare Other

## 2012-12-02 ENCOUNTER — Ambulatory Visit (AMBULATORY_SURGERY_CENTER): Payer: Self-pay

## 2012-12-02 VITALS — Ht 64.5 in | Wt 134.0 lb

## 2012-12-02 DIAGNOSIS — Z800 Family history of malignant neoplasm of digestive organs: Secondary | ICD-10-CM

## 2012-12-02 MED ORDER — MOVIPREP 100 G PO SOLR: 1.0000 | Freq: Once | ORAL | Status: DC

## 2012-12-04 DIAGNOSIS — H35059 Retinal neovascularization, unspecified, unspecified eye: Secondary | ICD-10-CM | POA: Diagnosis not present

## 2012-12-04 DIAGNOSIS — H353290 Exudative age-related macular degeneration, unspecified eye, stage unspecified: Secondary | ICD-10-CM | POA: Diagnosis not present

## 2012-12-09 ENCOUNTER — Ambulatory Visit (AMBULATORY_SURGERY_CENTER): Payer: Medicare Other | Admitting: Gastroenterology

## 2012-12-09 ENCOUNTER — Encounter: Payer: Self-pay | Admitting: Gastroenterology

## 2012-12-09 VITALS — BP 123/56 | HR 51 | Temp 97.0°F | Resp 18 | Ht 64.5 in | Wt 134.0 lb

## 2012-12-09 DIAGNOSIS — Z800 Family history of malignant neoplasm of digestive organs: Secondary | ICD-10-CM | POA: Diagnosis not present

## 2012-12-09 DIAGNOSIS — K5730 Diverticulosis of large intestine without perforation or abscess without bleeding: Secondary | ICD-10-CM

## 2012-12-09 DIAGNOSIS — D126 Benign neoplasm of colon, unspecified: Secondary | ICD-10-CM

## 2012-12-09 MED ORDER — SODIUM CHLORIDE 0.9 % IV SOLN: 500.0000 mL | INTRAVENOUS | Status: DC

## 2012-12-09 NOTE — Patient Instructions (Signed)
Discharge instructions given with verbal understanding. Handouts on polyps and diverticulosis. Resume previous medications. No aspirin or aspirin products for 10 days. YOU HAD AN ENDOSCOPIC PROCEDURE TODAY AT THE [facility]: Refer to the procedure report that was given to you for any specific questions about what was found during the examination.  If the procedure report does not answer your questions, please call your gastroenterologist to clarify.  If you requested that your care partner not be given the details of your procedure findings, then the procedure report has been included in a sealed envelope for you to review at your convenience later.  YOU SHOULD EXPECT: Some feelings of bloating in the abdomen. Passage of more gas than usual.  Walking can help get rid of the air that was put into your GI tract during the procedure and reduce the bloating. If you had a lower endoscopy (such as a colonoscopy or flexible sigmoidoscopy) you may notice spotting of blood in your stool or on the toilet paper. If you underwent a bowel prep for your procedure, then you may not have a normal bowel movement for a few days.  DIET: Your first meal following the procedure should be a light meal and then it is ok to progress to your normal diet.  A half-sandwich or bowl of soup is an example of a good first meal.  Heavy or fried foods are harder to digest and may make you feel nauseous or bloated.  Likewise meals heavy in dairy and vegetables can cause extra gas to form and this can also increase the bloating.  Drink plenty of fluids but you should avoid alcoholic beverages for 24 hours.  ACTIVITY: Your care partner should take you home directly after the procedure.  You should plan to take it easy, moving slowly for the rest of the day.  You can resume normal activity the day after the procedure however you should NOT DRIVE or use heavy machinery for 24 hours (because of the sedation medicines used during  the test).    SYMPTOMS TO REPORT IMMEDIATELY: A gastroenterologist can be reached at any hour.  During normal business hours, 8:30 AM to 5:00 PM Monday through Friday, call [phone].  After hours and on weekends, please call the GI answering service at [phone] who will take a message and have the physician on call contact you.   Following lower endoscopy (colonoscopy or flexible sigmoidoscopy):  Excessive amounts of blood in the stool  Significant tenderness or worsening of abdominal pains  Swelling of the abdomen that is new, acute  Fever of 100F or higher  FOLLOW UP: If any biopsies were taken you will be contacted by phone or by letter within the next 1-3 weeks.  Call your gastroenterologist if you have not heard about the biopsies in 3 weeks.  Our staff will call the home number listed on your records the next business day following your procedure to check on you and address any questions or concerns that you may have at that time regarding the information given to you following your procedure. This is a courtesy call and so if there is no answer at the home number and we have not heard from you through the emergency physician on call, we will assume that you have returned to your regular daily activities without incident.  SIGNATURES/CONFIDENTIALITY: You and/or your care partner have signed paperwork which will be entered into your electronic medical record.  These signatures attest to the fact that that  the information above on your After Visit Summary has been reviewed and is understood.  Full responsibility of the confidentiality of this discharge information lies with you and/or your care-partner.

## 2012-12-09 NOTE — Op Note (Signed)
[facility] [address] [place], [address]   COLONOSCOPY PROCEDURE REPORT PATIENT: Sarah Castro, Sarah Castro  MR#: [id number] BIRTHDATE: 1940/10/06 , 71  yrs. old GENDER: Female ENDOSCOPIST: Rachael Fee, MD REFERRED BY: Tyson Dense, MD PROCEDURE DATE:  12/09/2012 PROCEDURE:   Colonoscopy, screening First Screening Colonoscopy - Avg.  risk and is 50 yrs.  old or older Yes.  Prior Negative Screening - Now for repeat screening. N/A  History of Adenoma - Now for follow-up colonoscopy & has been > or = to 3 yrs.  N/A  Polyps Removed Today? Yes. ASA CLASS:   Class II INDICATIONS:average risk screening. MEDICATIONS: Fentanyl 75 mcg IV, Versed 6 mg IV, and These medications were titrated to patient response per physician's verbal order DESCRIPTION OF PROCEDURE:   After the risks benefits and alternatives of the procedure were thoroughly explained, informed consent was obtained.  A digital rectal exam revealed no abnormalities of the rectum.   The [id number]  endoscope was introduced through the anus and advanced to the cecum, which was identified by both the appendix and ileocecal valve. No adverse events experienced.   The quality of the prep was good.  The instrument was then slowly withdrawn as the colon was fully examined.  COLON FINDINGS: Three polyps were found, removed and sent to pathology.  They were all sessile.  Two of them ranged in size from 3mm to 4mm, located in cecum and ascending segments, removed with cold snare.  The last was 10mm, located in descending, removed with snare/cautery.  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  The examination was otherwise normal.  Retroflexed views revealed no abnormalities. The time to cecum=6 minutes 31 seconds.  Withdrawal time= 15 seconds. The scope was withdrawn and the procedure completed. COMPLICATIONS: There were no complications.  ENDOSCOPIC  IMPRESSION: Three polyps were found, removed and sent to pathology. 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 The examination was otherwise normal.  RECOMMENDATIONS: If the polyp(s) removed today are proven to be adenomatous (pre-cancerous) polyps, you will need a colonoscopy in 3 years. Otherwise you should continue to follow colorectal cancer screening guidelines for "routine risk" patients with a colonoscopy in 10 years.  You will receive a letter within 1-2 weeks with the results of your biopsy as well as final recommendations.  Please call my office if you have not received a letter after 3 weeks. NO ASA or NSAIDS for 10 days.   eSigned:  Rachael Fee, MD 12/09/2012 4:25 PM

## 2012-12-09 NOTE — Progress Notes (Signed)
Patient did not experience any of the following events: a burn prior to discharge; a fall within the facility; wrong site/side/patient/procedure/implant event; or a hospital transfer or hospital admission upon discharge from the facility. (G8907) Patient did not have preoperative order for IV antibiotic SSI prophylaxis. (G8918)  

## 2012-12-09 NOTE — Progress Notes (Signed)
The pt experienced abdominal cramping while the scope was advanced by Dr. Christella Hartigan.  Once the ceum was reached the pt relaxed and rested comfortably.  Pt tolerated the colonoscopy very well. Maw

## 2012-12-10 ENCOUNTER — Telehealth: Payer: Self-pay | Admitting: *Deleted

## 2012-12-10 NOTE — Telephone Encounter (Signed)
  Follow up Call-  Call back number 12/09/2012  Post procedure Call Back phone  # [phone]  Permission to leave phone message Yes     Patient questions:  Do you have a fever, pain , or abdominal swelling? no Pain Score  0 *  Have you tolerated food without any problems? yes  Have you been able to return to your normal activities? yes  Do you have any questions about your discharge instructions: Diet   no Medications  no Follow up visit  no  Do you have questions or concerns about your Care? no  Actions: * If pain score is 4 or above: No action needed, pain <4.

## 2012-12-13 ENCOUNTER — Other Ambulatory Visit: Payer: Self-pay | Admitting: Gastroenterology

## 2012-12-13 DIAGNOSIS — D126 Benign neoplasm of colon, unspecified: Secondary | ICD-10-CM

## 2012-12-17 ENCOUNTER — Encounter: Payer: Self-pay | Admitting: Gastroenterology

## 2013-01-09 DIAGNOSIS — H353290 Exudative age-related macular degeneration, unspecified eye, stage unspecified: Secondary | ICD-10-CM | POA: Diagnosis not present

## 2013-01-09 DIAGNOSIS — H35059 Retinal neovascularization, unspecified, unspecified eye: Secondary | ICD-10-CM | POA: Diagnosis not present

## 2013-02-03 DIAGNOSIS — Z1231 Encounter for screening mammogram for malignant neoplasm of breast: Secondary | ICD-10-CM | POA: Diagnosis not present

## 2013-02-12 DIAGNOSIS — H35059 Retinal neovascularization, unspecified, unspecified eye: Secondary | ICD-10-CM | POA: Diagnosis not present

## 2013-02-12 DIAGNOSIS — H353290 Exudative age-related macular degeneration, unspecified eye, stage unspecified: Secondary | ICD-10-CM | POA: Diagnosis not present

## 2013-02-20 DIAGNOSIS — E782 Mixed hyperlipidemia: Secondary | ICD-10-CM | POA: Diagnosis not present

## 2013-02-20 DIAGNOSIS — I10 Essential (primary) hypertension: Secondary | ICD-10-CM | POA: Diagnosis not present

## 2013-03-14 DIAGNOSIS — J209 Acute bronchitis, unspecified: Secondary | ICD-10-CM | POA: Diagnosis not present

## 2013-03-14 DIAGNOSIS — J019 Acute sinusitis, unspecified: Secondary | ICD-10-CM | POA: Diagnosis not present

## 2013-03-18 DIAGNOSIS — H353290 Exudative age-related macular degeneration, unspecified eye, stage unspecified: Secondary | ICD-10-CM | POA: Diagnosis not present

## 2013-03-18 DIAGNOSIS — H35059 Retinal neovascularization, unspecified, unspecified eye: Secondary | ICD-10-CM | POA: Diagnosis not present

## 2013-04-02 DIAGNOSIS — Z96659 Presence of unspecified artificial knee joint: Secondary | ICD-10-CM | POA: Diagnosis not present

## 2013-04-02 DIAGNOSIS — M2390 Unspecified internal derangement of unspecified knee: Secondary | ICD-10-CM | POA: Diagnosis not present

## 2013-04-29 DIAGNOSIS — H35059 Retinal neovascularization, unspecified, unspecified eye: Secondary | ICD-10-CM | POA: Diagnosis not present

## 2013-04-29 DIAGNOSIS — H353290 Exudative age-related macular degeneration, unspecified eye, stage unspecified: Secondary | ICD-10-CM | POA: Diagnosis not present

## 2013-05-30 DIAGNOSIS — H43399 Other vitreous opacities, unspecified eye: Secondary | ICD-10-CM | POA: Diagnosis not present

## 2013-05-30 DIAGNOSIS — H26499 Other secondary cataract, unspecified eye: Secondary | ICD-10-CM | POA: Diagnosis not present

## 2013-05-30 DIAGNOSIS — H3530 Unspecified macular degeneration: Secondary | ICD-10-CM | POA: Diagnosis not present

## 2013-06-17 DIAGNOSIS — H35059 Retinal neovascularization, unspecified, unspecified eye: Secondary | ICD-10-CM | POA: Diagnosis not present

## 2013-06-17 DIAGNOSIS — H353290 Exudative age-related macular degeneration, unspecified eye, stage unspecified: Secondary | ICD-10-CM | POA: Diagnosis not present

## 2013-07-16 ENCOUNTER — Ambulatory Visit: Payer: Medicare Other | Admitting: Family Medicine

## 2013-07-23 DIAGNOSIS — H353290 Exudative age-related macular degeneration, unspecified eye, stage unspecified: Secondary | ICD-10-CM | POA: Diagnosis not present

## 2013-07-23 DIAGNOSIS — H35059 Retinal neovascularization, unspecified, unspecified eye: Secondary | ICD-10-CM | POA: Diagnosis not present

## 2013-07-25 ENCOUNTER — Telehealth: Payer: Self-pay

## 2013-07-25 ENCOUNTER — Ambulatory Visit (INDEPENDENT_AMBULATORY_CARE_PROVIDER_SITE_OTHER): Payer: Medicare Other | Admitting: Family Medicine

## 2013-07-25 ENCOUNTER — Other Ambulatory Visit (INDEPENDENT_AMBULATORY_CARE_PROVIDER_SITE_OTHER): Payer: Medicare Other

## 2013-07-25 ENCOUNTER — Encounter: Payer: Self-pay | Admitting: Family Medicine

## 2013-07-25 VITALS — BP 152/90 | HR 82 | Ht 64.5 in | Wt 133.0 lb

## 2013-07-25 DIAGNOSIS — M1712 Unilateral primary osteoarthritis, left knee: Secondary | ICD-10-CM

## 2013-07-25 DIAGNOSIS — M1710 Unilateral primary osteoarthritis, unspecified knee: Secondary | ICD-10-CM

## 2013-07-25 MED ORDER — DICLOFENAC SODIUM 2 % TD SOLN: 2.0000 "application " | Freq: Two times a day (BID) | TRANSDERMAL | Status: DC

## 2013-07-25 NOTE — Telephone Encounter (Signed)
Phone call from patient states she saw you today and received a heel insert. She lost the insert, not sure where it's at because she made three stops on the way home. Please advise.

## 2013-07-25 NOTE — Progress Notes (Signed)
[name] [address] [place], [place] [address] Phone: [phone] Subjective:      CC: left knee pain  Sarah Castro is a 73 y.o. female coming in with complaint of left knee pain. Patient has had this pain for multiple months. 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 Patient states that she has more of a dull aching sensation that occurs mostly on the inside part of her knee. Patient denies any clicking or giving out on her. States that the pain can hurt her at night. Does not respond very well to anti-inflammatories. Patient states that it does not stop her from any type of activity at this time but she does have to change certain positions throughout the day. Patient with the severity of 7/10. No numbness or weakness noted.     Past medical history, social, surgical and family history all reviewed in electronic medical record.   Review of Systems: No headache, visual changes, nausea, vomiting, diarrhea, constipation, dizziness, abdominal pain, skin rash, fevers, chills, night sweats, weight loss, swollen lymph nodes, body aches, joint swelling, muscle aches, chest pain, shortness of breath, mood changes.   Objective Blood pressure 152/90, pulse 82, height 5' 4.5" (1.638 m), weight 133 lb (60.328 kg), SpO2 96.00%.  General: No apparent distress alert and oriented x3 mood and affect normal, dressed appropriately.  HEENT: Pupils equal, extraocular movements intact  Respiratory: Patient's speak in full sentences and does not appear short of breath  Cardiovascular: No lower extremity edema, non tender, no erythema  Skin: Warm dry intact with no signs of infection or rash on extremities or on axial skeleton.  Abdomen: Soft nontender  Neuro: Cranial nerves II through XII are intact, neurovascularly intact in all extremities with 2+ DTRs and 2+ pulses.  Lymph: No  lymphadenopathy of posterior or anterior cervical chain or axillae bilaterally.  Gait normal with good balance and coordination. Patient though does have a leg length discrepancy with the right leg being a quarter of an inch shorter than the left MSK:  Non tender with full range of motion and good stability and symmetric strength and tone of shoulders, elbows, wrist, hip, and ankles bilaterally.  Knee: Left  Normal to inspection with no erythema or effusion or obvious bony abnormalities. Patient does have some mild atrophy of the thigh compared to the contralateral leg Palpation shows some tenderness to palpation of the medial joint line ROM full in flexion and extension and lower leg rotation. Ligaments with solid consistent endpoints including ACL, PCL, LCL, MCL. Negative Mcmurray's, Apley's, and Thessalonian tests.  painful patellar compression. Patellar glide moderate crepitus. Patellar and quadriceps tendons unremarkable. Hamstring and quadriceps strength is normal.   MSK US performed of: Left knee This study was ordered, performed, and interpreted by Charlann Boxer D.O.  Knee: All structures visualized. Anteromedial, anterolateral, posteromedial, and posterolateral menisci all have some mild degenerative changes but no true tear appreciated. Patient does have narrowing of the medial and lateral joint lines.  Patellar Tendon unremarkable on long and transverse views without effusion. No abnormality of prepatellar bursa. LCL and MCL unremarkable on long and transverse views. No abnormality of origin of medial or lateral head of the gastrocnemius.  IMPRESSION:  Severe osteophytic changes but no specific findings.     Impression and Recommendations:     This case required medical decision making of moderate complexity.

## 2013-07-25 NOTE — Assessment & Plan Note (Signed)
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 Patient was put in a brace was fitted by me today. We discussed icing: Over-the-counter medications he can be beneficial. We discussed anti-inflammatories and given a sheet of home exercises. Patient is going to this on a regular basis and come back in 3 weeks. Patient does have a trip planned in is going to be doing a lot of walking. Patient is no better in 3 weeks I would consider an intra-articular injection.

## 2013-07-25 NOTE — Patient Instructions (Signed)
Great to see you Ice 20 minutes 2 times a day Exercises 3 times a week Wear brace with a a lot of activity Put heel lift in right shoe Turmeric 500mg  twice daily Try pennsiad twice daily on knee Come back in 3 weeks.  If not much better we will consider injection before your trip.

## 2013-07-25 NOTE — Telephone Encounter (Signed)
Left msg letting pt know she can stop by the office to pick another one up.

## 2013-08-22 ENCOUNTER — Encounter: Payer: Self-pay | Admitting: Family Medicine

## 2013-08-22 ENCOUNTER — Ambulatory Visit (INDEPENDENT_AMBULATORY_CARE_PROVIDER_SITE_OTHER): Payer: Medicare Other | Admitting: Family Medicine

## 2013-08-22 VITALS — BP 120/80 | HR 89 | Temp 98.1°F | Wt 132.0 lb

## 2013-08-22 DIAGNOSIS — M2141 Flat foot [pes planus] (acquired), right foot: Secondary | ICD-10-CM

## 2013-08-22 DIAGNOSIS — M1712 Unilateral primary osteoarthritis, left knee: Secondary | ICD-10-CM

## 2013-08-22 DIAGNOSIS — Z23 Encounter for immunization: Secondary | ICD-10-CM | POA: Diagnosis not present

## 2013-08-22 DIAGNOSIS — Z1331 Encounter for screening for depression: Secondary | ICD-10-CM | POA: Diagnosis not present

## 2013-08-22 DIAGNOSIS — M1710 Unilateral primary osteoarthritis, unspecified knee: Secondary | ICD-10-CM | POA: Diagnosis not present

## 2013-08-22 DIAGNOSIS — I10 Essential (primary) hypertension: Secondary | ICD-10-CM | POA: Diagnosis not present

## 2013-08-22 DIAGNOSIS — R7309 Other abnormal glucose: Secondary | ICD-10-CM | POA: Diagnosis not present

## 2013-08-22 DIAGNOSIS — M2140 Flat foot [pes planus] (acquired), unspecified foot: Secondary | ICD-10-CM

## 2013-08-22 DIAGNOSIS — Z0000 Encounter for general adult medical examination without abnormal findings: Secondary | ICD-10-CM | POA: Diagnosis not present

## 2013-08-22 DIAGNOSIS — E782 Mixed hyperlipidemia: Secondary | ICD-10-CM | POA: Diagnosis not present

## 2013-08-22 DIAGNOSIS — M2142 Flat foot [pes planus] (acquired), left foot: Secondary | ICD-10-CM

## 2013-08-22 DIAGNOSIS — M899 Disorder of bone, unspecified: Secondary | ICD-10-CM | POA: Diagnosis not present

## 2013-08-22 DIAGNOSIS — M949 Disorder of cartilage, unspecified: Secondary | ICD-10-CM | POA: Diagnosis not present

## 2013-08-22 NOTE — Patient Instructions (Signed)
Exercises 3 times a week.  Continue ice when you need it Take the pennsaid Try wearing the brace less but if walking long distances or stairs please wear it.  [employer] online, "[employer]" Make an appointment with me for orthotics when you return.  Have fun in [place]!

## 2013-08-22 NOTE — Progress Notes (Signed)
Pre visit review using our clinic review tool, if applicable. No additional management support is needed unless otherwise documented below in the visit note. 

## 2013-08-22 NOTE — Assessment & Plan Note (Signed)
Patient is doing remarkably well overall. Patient was given phase II exercises for more strengthening and conditioning. Patient was sure proper technique which did take some time today. We also discussed continuing the icing and show patient proper placement of the brace again. Patient will continue with the anti-inflammatories topically as needed. No refill is necessary at this time. We discussed continuing the icing regimen. We will hold on any type of injection at this time. Discuss the possibility of orthotics in the future secondary to patient's foot that could be increasing the amount of pressure on the knees. Patient will follow up again after her trip to be about 4-6 weeks.  Spent greater than 25 minutes with patient face-to-face and had greater than 50% of counseling including as described above in assessment and plan.

## 2013-08-22 NOTE — Progress Notes (Signed)
  [name] [address] [place], [place] [address] Phone: [phone] Subjective:      CC: left knee pain  YKD:[id number] Sarah Castro is a 73 y.o. female coming in with complaint of left knee pain. 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 We have tried conservative therapy with bracing, icing, and home exercise program. Patient states that she is approximately 50% better. Patient states that she's been doing the exercises religiously which has been very helpful. Patient does notice that she can walk longer distances without as much pain. Denies any new symptoms. Patient states that the brace is very comfortable. Has only used the topical anti-inflammatory one time.     Past medical history, social, surgical and family history all reviewed in electronic medical record.   Review of Systems: No headache, visual changes, nausea, vomiting, diarrhea, constipation, dizziness, abdominal pain, skin rash, fevers, chills, night sweats, weight loss, swollen lymph nodes, body aches, joint swelling, muscle aches, chest pain, shortness of breath, mood changes.   Objective Blood pressure 120/80, pulse 89, temperature 98.1 F (36.7 C), temperature source Oral, weight 132 lb (59.875 kg), SpO2 97.00%.  General: No apparent distress alert and oriented x3 mood and affect normal, dressed appropriately.  HEENT: Pupils equal, extraocular movements intact  Respiratory: Patient's speak in full sentences and does not appear short of breath  Cardiovascular: No lower extremity edema, non tender, no erythema  Skin: Warm dry intact with no signs of infection or rash on extremities or on axial skeleton.  Abdomen: Soft nontender  Neuro: Cranial nerves II through XII are intact, neurovascularly intact in all extremities with 2+ DTRs and 2+ pulses.  Lymph: No lymphadenopathy of posterior or anterior cervical chain or  axillae bilaterally.  Gait normal with good balance and coordination. Patient though does have a leg length discrepancy with the right leg being a quarter of an inch shorter than the left MSK:  Non tender with full range of motion and good stability and symmetric strength and tone of shoulders, elbows, wrist, hip, and ankles bilaterally.  Knee: Left  Normal to inspection with no erythema or effusion or obvious bony abnormalities. Patient has had increasing in muscle strength of the left leg compared to contralateral leg. Palpation shows some tenderness to palpation of the medial joint line but less than previous exam. ROM full in flexion and extension and lower leg rotation. Ligaments with solid consistent endpoints including ACL, PCL, LCL, MCL. Negative Mcmurray's, Apley's, and Thessalonian tests.  painful patellar compression. Patellar glide moderate crepitus. Patellar and quadriceps tendons unremarkable. Hamstring and quadriceps strength is normal.   Severe pes planus bilaterally   Impression and Recommendations:     This case required medical decision making of moderate complexity.

## 2013-08-26 DIAGNOSIS — H353290 Exudative age-related macular degeneration, unspecified eye, stage unspecified: Secondary | ICD-10-CM | POA: Diagnosis not present

## 2013-08-26 DIAGNOSIS — H35059 Retinal neovascularization, unspecified, unspecified eye: Secondary | ICD-10-CM | POA: Diagnosis not present

## 2013-10-02 DIAGNOSIS — H35059 Retinal neovascularization, unspecified, unspecified eye: Secondary | ICD-10-CM | POA: Diagnosis not present

## 2013-10-02 DIAGNOSIS — H353290 Exudative age-related macular degeneration, unspecified eye, stage unspecified: Secondary | ICD-10-CM | POA: Diagnosis not present

## 2013-11-04 DIAGNOSIS — H35059 Retinal neovascularization, unspecified, unspecified eye: Secondary | ICD-10-CM | POA: Diagnosis not present

## 2013-11-04 DIAGNOSIS — H353290 Exudative age-related macular degeneration, unspecified eye, stage unspecified: Secondary | ICD-10-CM | POA: Diagnosis not present

## 2013-11-05 DIAGNOSIS — H353290 Exudative age-related macular degeneration, unspecified eye, stage unspecified: Secondary | ICD-10-CM | POA: Diagnosis not present

## 2013-11-05 DIAGNOSIS — H35059 Retinal neovascularization, unspecified, unspecified eye: Secondary | ICD-10-CM | POA: Diagnosis not present

## 2013-11-17 DIAGNOSIS — Z23 Encounter for immunization: Secondary | ICD-10-CM | POA: Diagnosis not present

## 2013-12-09 DIAGNOSIS — H3532 Exudative age-related macular degeneration: Secondary | ICD-10-CM | POA: Diagnosis not present

## 2013-12-09 DIAGNOSIS — H35051 Retinal neovascularization, unspecified, right eye: Secondary | ICD-10-CM | POA: Diagnosis not present

## 2013-12-17 DIAGNOSIS — H3532 Exudative age-related macular degeneration: Secondary | ICD-10-CM | POA: Diagnosis not present

## 2013-12-17 DIAGNOSIS — H35052 Retinal neovascularization, unspecified, left eye: Secondary | ICD-10-CM | POA: Diagnosis not present

## 2014-01-19 DIAGNOSIS — H35051 Retinal neovascularization, unspecified, right eye: Secondary | ICD-10-CM | POA: Diagnosis not present

## 2014-01-19 DIAGNOSIS — H3532 Exudative age-related macular degeneration: Secondary | ICD-10-CM | POA: Diagnosis not present

## 2014-01-20 DIAGNOSIS — L309 Dermatitis, unspecified: Secondary | ICD-10-CM | POA: Diagnosis not present

## 2014-01-22 DIAGNOSIS — H3532 Exudative age-related macular degeneration: Secondary | ICD-10-CM | POA: Diagnosis not present

## 2014-01-22 DIAGNOSIS — H35052 Retinal neovascularization, unspecified, left eye: Secondary | ICD-10-CM | POA: Diagnosis not present

## 2014-02-16 DIAGNOSIS — Z1231 Encounter for screening mammogram for malignant neoplasm of breast: Secondary | ICD-10-CM | POA: Diagnosis not present

## 2014-02-23 DIAGNOSIS — M1990 Unspecified osteoarthritis, unspecified site: Secondary | ICD-10-CM | POA: Diagnosis not present

## 2014-02-23 DIAGNOSIS — H35051 Retinal neovascularization, unspecified, right eye: Secondary | ICD-10-CM | POA: Diagnosis not present

## 2014-02-23 DIAGNOSIS — R7301 Impaired fasting glucose: Secondary | ICD-10-CM | POA: Diagnosis not present

## 2014-02-23 DIAGNOSIS — H3532 Exudative age-related macular degeneration: Secondary | ICD-10-CM | POA: Diagnosis not present

## 2014-02-23 DIAGNOSIS — K589 Irritable bowel syndrome without diarrhea: Secondary | ICD-10-CM | POA: Diagnosis not present

## 2014-02-23 DIAGNOSIS — E782 Mixed hyperlipidemia: Secondary | ICD-10-CM | POA: Diagnosis not present

## 2014-02-23 DIAGNOSIS — I10 Essential (primary) hypertension: Secondary | ICD-10-CM | POA: Diagnosis not present

## 2014-02-26 DIAGNOSIS — H3532 Exudative age-related macular degeneration: Secondary | ICD-10-CM | POA: Diagnosis not present

## 2014-02-26 DIAGNOSIS — H35052 Retinal neovascularization, unspecified, left eye: Secondary | ICD-10-CM | POA: Diagnosis not present

## 2014-03-21 IMAGING — DX DG CHEST 1V PORT
1 series · 1 of 1 positions shown · non-contrast
Comparison: CHEST x-ray 03/11/2012.

CLINICAL DATA: Abdominal pain. Preoperative study prior to
appendectomy.

EXAM:
PORTABLE CHEST - 1 VIEW

[portable]
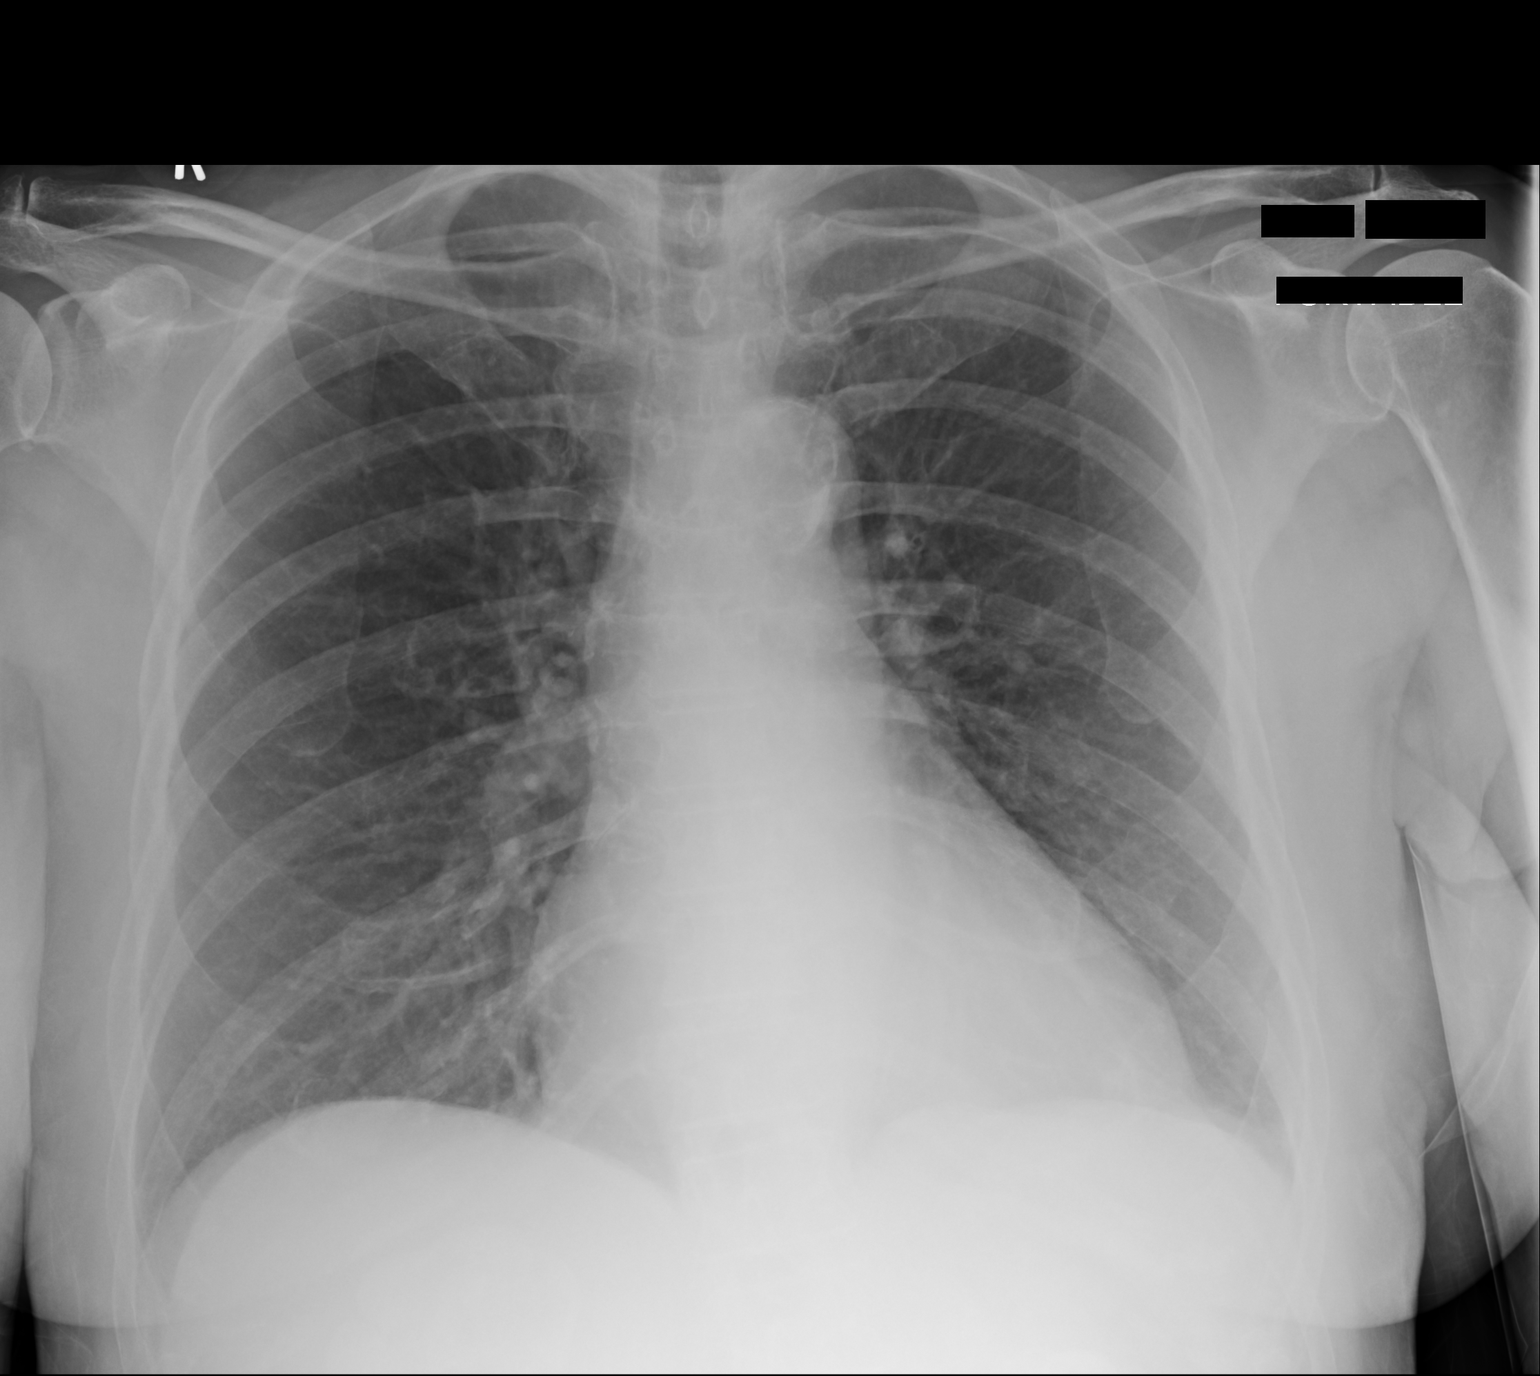

[1 of 1 positions shown; findings below may reference images not displayed]

FINDINGS: Lung volumes are normal. No consolidative airspace disease. No
pleural effusions. No pneumothorax. No pulmonary nodule or mass
noted. Pulmonary vasculature and the cardiomediastinal silhouette
are within normal limits. Atherosclerosis in the thoracic aorta.
IMPRESSION: 1.  No radiographic evidence of acute cardiopulmonary disease.
2. Atherosclerosis.

## 2014-03-21 IMAGING — CT CT ABD-PELV W/ CM
2 of 5 series · 17 of 46 positions shown, 19 images · IV contrast (READICAT/WATER & [ID] OMNI 300)
Comparison: None.

CLINICAL DATA: Evaluate for appendicitis

CT ABDOMEN AND PELVIS WITH CONTRAST
TECHNIQUE: Multidetector CT imaging of the abdomen and pelvis was
performed following the standard protocol during bolus
administration of intravenous contrast.
Contrast: 100mL OMNIPAQUE IOHEXOL 300 MG/ML  SOLN

[Series 2: abd/pelvis with · axial · 0.68mm/px · z∈[-435,-50]mm · 14 of 87 slices shown, 16 images]
[im 5/87  soft-tissue]
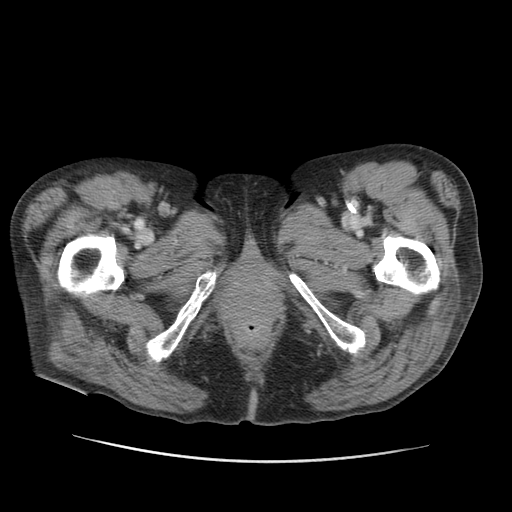
[im 5/87  bone]
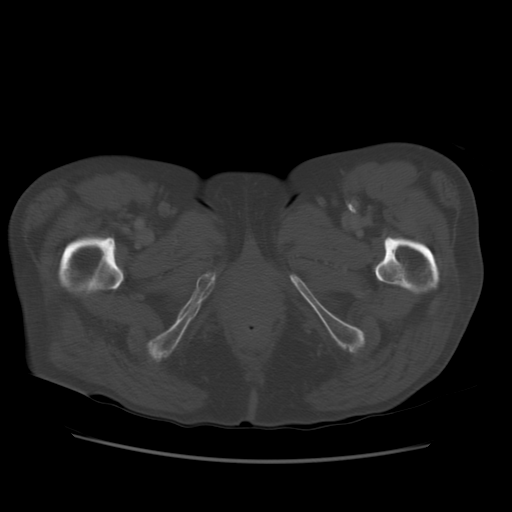
[im 10/87  soft-tissue]
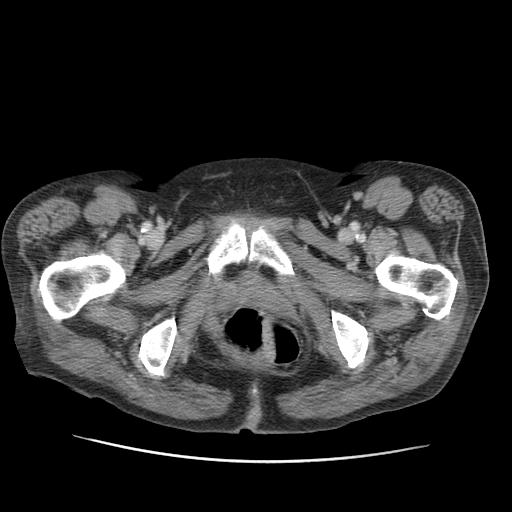
[im 19/87  soft-tissue]
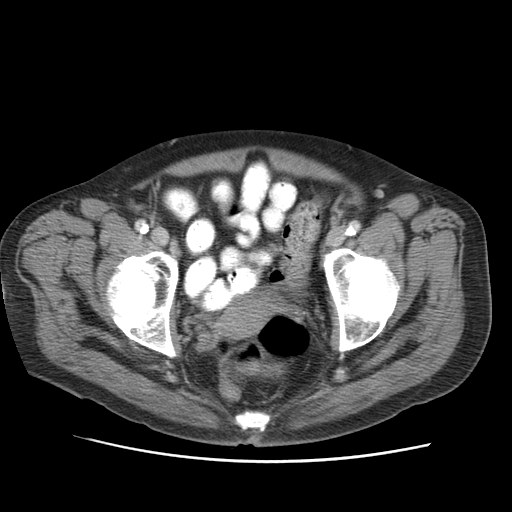
[im 23/87  soft-tissue]
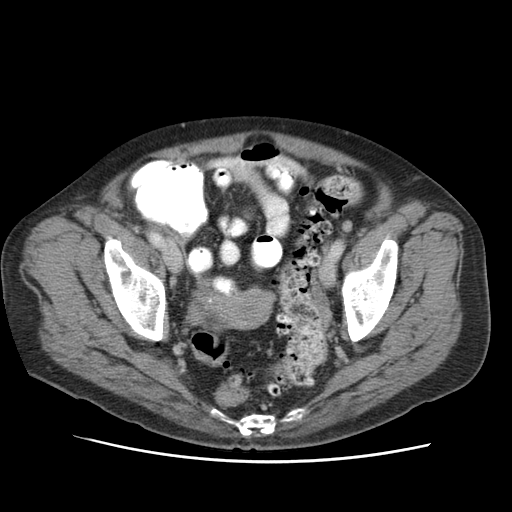
[im 28/87  soft-tissue]
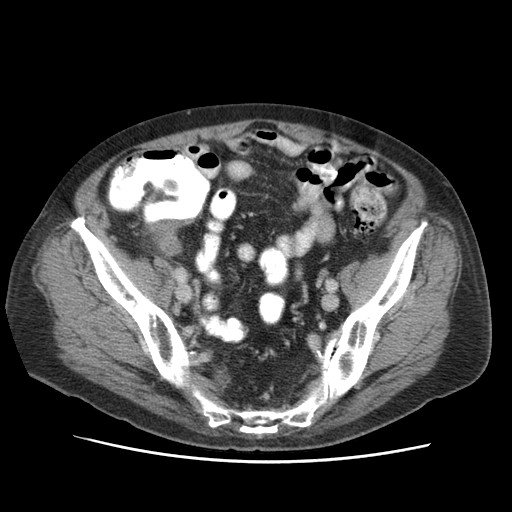
[im 37/87  soft-tissue]
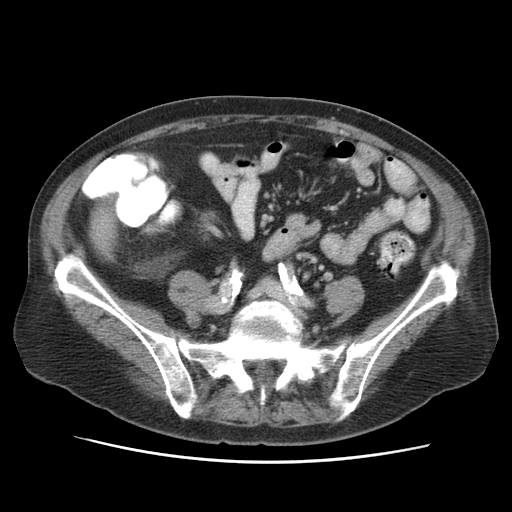
[im 41/87  soft-tissue]
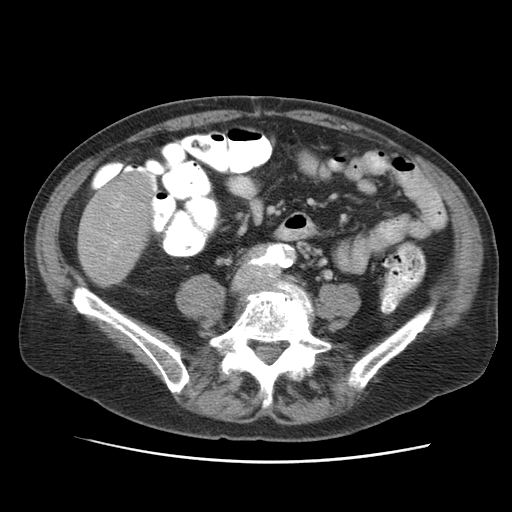
[im 46/87  soft-tissue]
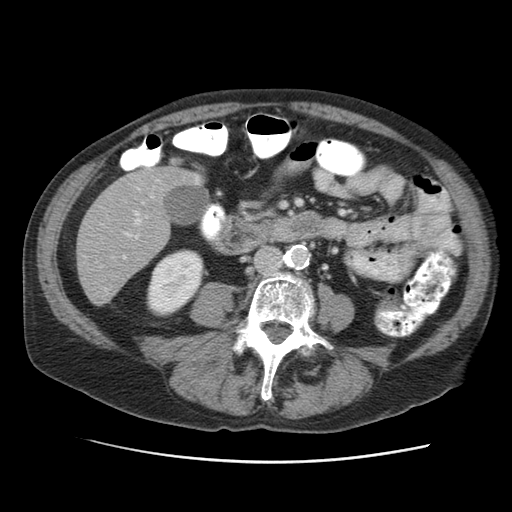
[im 50/87  soft-tissue]
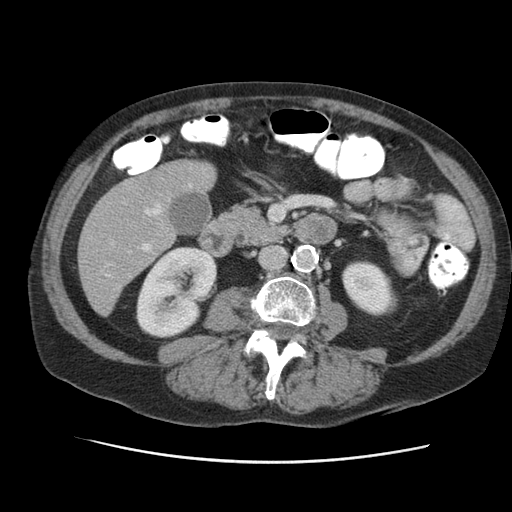
[im 50/87  bone]
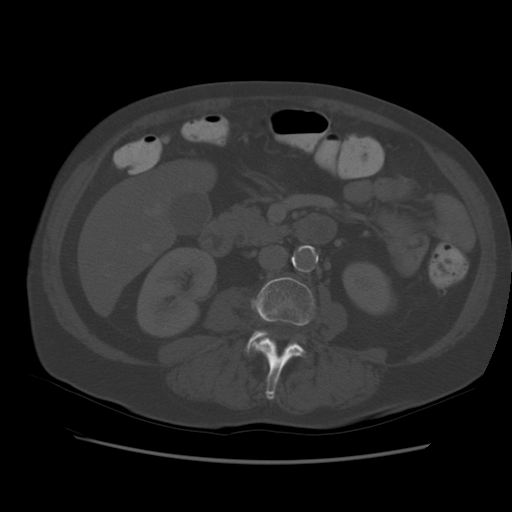
[im 59/87  soft-tissue]
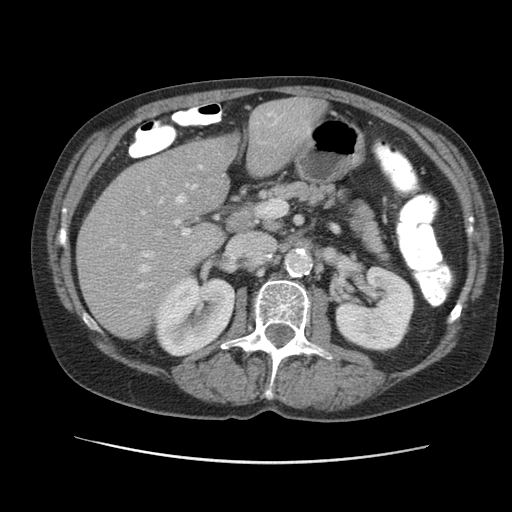
[im 64/87  soft-tissue]
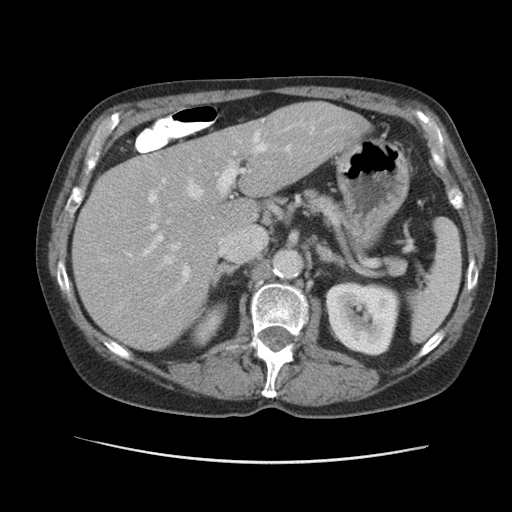
[im 68/87  soft-tissue]
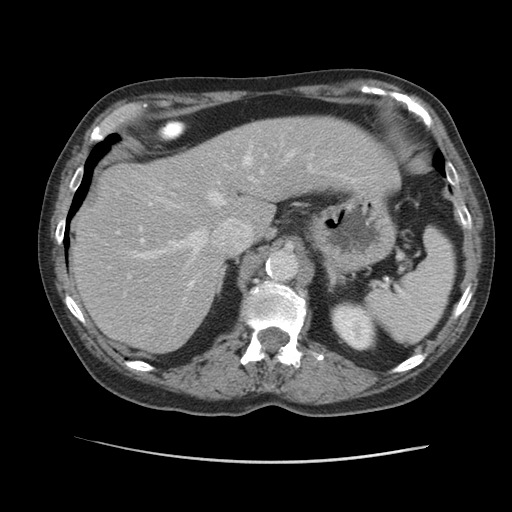
[im 77/87  soft-tissue]
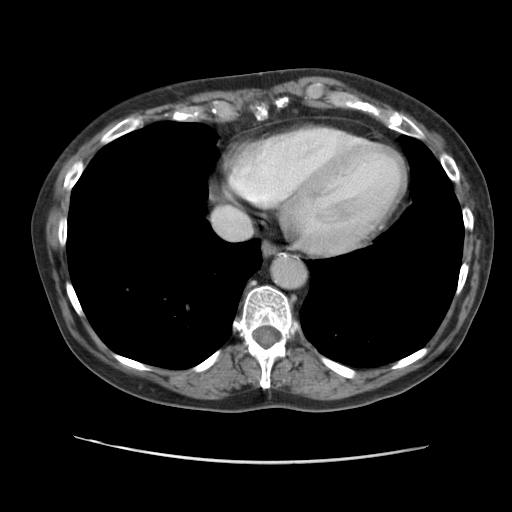
[im 82/87  soft-tissue]
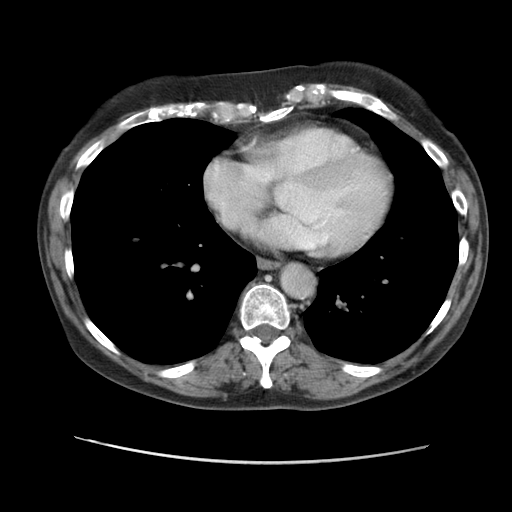

[Series 400: cor · coronal · 0.86mm/px · 3 of 131 slices shown]
[im 44/131  soft-tissue]
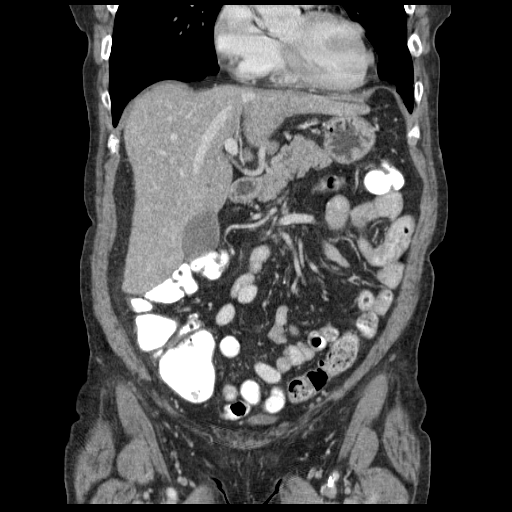
[im 58/131  soft-tissue]
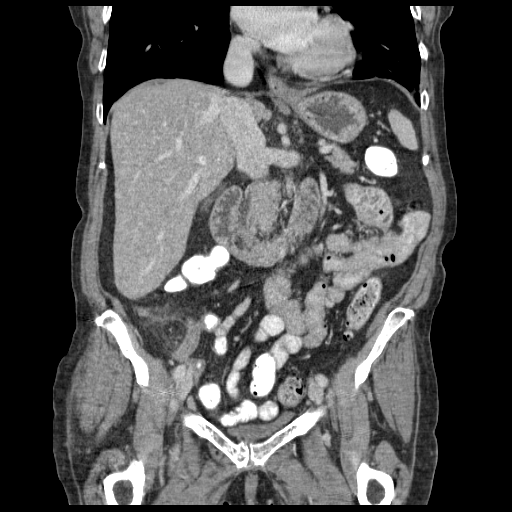
[im 73/131  soft-tissue]
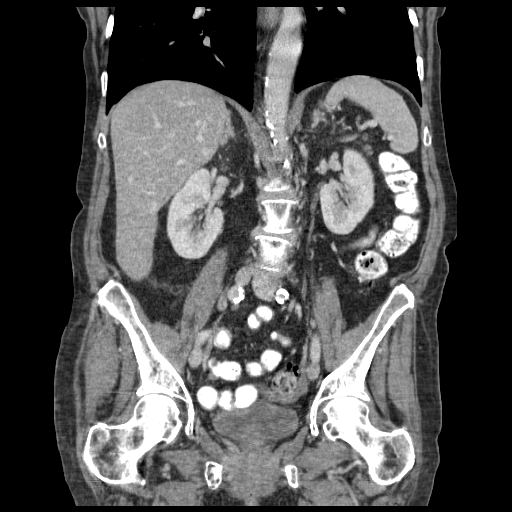

[17 of 46 positions shown; findings below may reference images not displayed]

FINDINGS: No pleural effusion identified.  Lung bases are clear.
Calcification within the LAD coronary artery noted.

Low attenuation structure within the left hepatic lobe measures 8
mm, image 16/series 2.  No additional suspicious liver
abnormalities.  The gallbladder appears normal.  No biliary
dilatation.  Normal appearance of the pancreas.  The spleen is
unremarkable.

The adrenal glands are both normal.  Normal appearance of the right
kidney.  The left kidney is normal.  The urinary bladder appears
normal.  Uterus and adnexal structures are unremarkable.

The stomach is normal.  The small bowel loops have a normal course
and caliber without obstruction.  The appendix is abnormally
thickened and there is periappendiceal inflammation.  The wall of
the appendix appears edematous.  No abscess identified.  No free
air. The proximal colon is normal.  Multiple distal colonic
diverticula are identified.  No acute inflammation.

Review of the visualized bony structures is significant for mild
curvature of the lumbar spine towards the left.  There is
multilevel degenerative disc disease noted.  A first-degree
anterolisthesis of L4 on L5 is noted.
IMPRESSION: 1.  Examination is positive for acute appendicitis.
2.  No evidence for perforation or abscess.

These results will be called to the ordering clinician or
representative by the Radiologist Assistant, and communication
documented in the PACS Dashboard.

## 2014-03-30 DIAGNOSIS — H3532 Exudative age-related macular degeneration: Secondary | ICD-10-CM | POA: Diagnosis not present

## 2014-03-30 DIAGNOSIS — H35051 Retinal neovascularization, unspecified, right eye: Secondary | ICD-10-CM | POA: Diagnosis not present

## 2014-04-06 DIAGNOSIS — H3532 Exudative age-related macular degeneration: Secondary | ICD-10-CM | POA: Diagnosis not present

## 2014-04-06 DIAGNOSIS — H35052 Retinal neovascularization, unspecified, left eye: Secondary | ICD-10-CM | POA: Diagnosis not present

## 2014-04-14 DIAGNOSIS — I10 Essential (primary) hypertension: Secondary | ICD-10-CM | POA: Diagnosis not present

## 2014-04-14 DIAGNOSIS — J0100 Acute maxillary sinusitis, unspecified: Secondary | ICD-10-CM | POA: Diagnosis not present

## 2014-04-14 DIAGNOSIS — H66002 Acute suppurative otitis media without spontaneous rupture of ear drum, left ear: Secondary | ICD-10-CM | POA: Diagnosis not present

## 2014-04-14 DIAGNOSIS — J209 Acute bronchitis, unspecified: Secondary | ICD-10-CM | POA: Diagnosis not present

## 2014-05-11 DIAGNOSIS — H35052 Retinal neovascularization, unspecified, left eye: Secondary | ICD-10-CM | POA: Diagnosis not present

## 2014-05-11 DIAGNOSIS — H3532 Exudative age-related macular degeneration: Secondary | ICD-10-CM | POA: Diagnosis not present

## 2014-05-20 DIAGNOSIS — H35051 Retinal neovascularization, unspecified, right eye: Secondary | ICD-10-CM | POA: Diagnosis not present

## 2014-05-20 DIAGNOSIS — H3532 Exudative age-related macular degeneration: Secondary | ICD-10-CM | POA: Diagnosis not present

## 2014-06-15 DIAGNOSIS — H3532 Exudative age-related macular degeneration: Secondary | ICD-10-CM | POA: Diagnosis not present

## 2014-06-15 DIAGNOSIS — H35721 Serous detachment of retinal pigment epithelium, right eye: Secondary | ICD-10-CM | POA: Diagnosis not present

## 2014-07-22 DIAGNOSIS — H3532 Exudative age-related macular degeneration: Secondary | ICD-10-CM | POA: Diagnosis not present

## 2014-07-30 ENCOUNTER — Telehealth: Payer: Self-pay | Admitting: Family Medicine

## 2014-07-30 NOTE — Telephone Encounter (Signed)
Patient states she is having pain in her feet.  I have scheduled for 7/5.  She is requesting to be worked in sooner if possible .

## 2014-08-03 NOTE — Telephone Encounter (Signed)
Spoke to pt, scheduled her for tomorrow at 4pm.

## 2014-08-04 ENCOUNTER — Encounter (INDEPENDENT_AMBULATORY_CARE_PROVIDER_SITE_OTHER): Payer: Self-pay

## 2014-08-04 ENCOUNTER — Encounter: Payer: Self-pay | Admitting: Family Medicine

## 2014-08-04 ENCOUNTER — Ambulatory Visit (INDEPENDENT_AMBULATORY_CARE_PROVIDER_SITE_OTHER): Payer: Medicare Other | Admitting: Family Medicine

## 2014-08-04 VITALS — BP 136/72 | HR 89 | Ht 64.5 in | Wt 135.0 lb

## 2014-08-04 DIAGNOSIS — B351 Tinea unguium: Secondary | ICD-10-CM

## 2014-08-04 DIAGNOSIS — M2141 Flat foot [pes planus] (acquired), right foot: Secondary | ICD-10-CM | POA: Diagnosis not present

## 2014-08-04 DIAGNOSIS — M2142 Flat foot [pes planus] (acquired), left foot: Secondary | ICD-10-CM | POA: Diagnosis not present

## 2014-08-04 MED ORDER — HYDROCORTISONE 2.5 % EX OINT: TOPICAL_OINTMENT | CUTANEOUS | Status: DC

## 2014-08-04 NOTE — Patient Instructions (Addendum)
Good to see you For the feet Consider [employer] shoe or other shoes with a wide toebox and metatarsal pads [email or web address] for metatarsal pads or ask the shoe market as well.  Try cream 2 times daily until resolve then as needed Can try tenactin spray as well.  Avoid thong sandals for next 2 weeks really.  In the fall we can consider some orthotics with thicker sole.  Call me if you need me.

## 2014-08-04 NOTE — Assessment & Plan Note (Signed)
Patient given cream to see if this will make any benefit.

## 2014-08-04 NOTE — Progress Notes (Signed)
  [name] [place], [place] [address] Phone: [phone] Subjective:    I'm seeing this patient by the request  of:    CC: Bilateral foot pain  XNA:TFTDDUKGUR JEFFERY GAMMELL is a 74 y.o. female coming in with complaint of bilateral foot pain. 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 On the left side of her foot she had a bunionectomy as well as a posterior tibialis transposition multiple years ago. Patient states that she has had numerous surgeries of the left foot but now the right foot seems to be worsening. Patient is having more of a hammertoe on the second toe. Patient states that even with certain activities it seems to be painful. Also having pain between the first and second toes whenever she wears flip-flops. Patient states that she wants to be able to continue to ambulate but is finding it difficult to do so. Patient denies any numbness or tingling. Denies any radiation of pain.     Past medical history, social, surgical and family history all reviewed in electronic medical record.   Review of Systems: No headache, visual changes, nausea, vomiting, diarrhea, constipation, dizziness, abdominal pain, skin rash, fevers, chills, night sweats, weight loss, swollen lymph nodes, body aches, joint swelling, muscle aches, chest pain, shortness of breath, mood changes.   Objective Blood pressure 136/72, pulse 89, height 5' 4.5" (1.638 m), weight 135 lb (61.236 kg), SpO2 97 %.  General: No apparent distress alert and oriented x3 mood and affect normal, dressed appropriately.  HEENT: Pupils equal, extraocular movements intact  Respiratory: Patient's speak in full sentences and does not appear short of breath  Cardiovascular: No lower extremity edema, non tender, no erythema  Skin: Warm dry intact with no signs of infection or rash on extremities or on axial skeleton.  Abdomen: Soft nontender  Neuro: Cranial nerves II through XII are  intact, neurovascularly intact in all extremities with 2+ DTRs and 2+ pulses.  Lymph: No lymphadenopathy of posterior or anterior cervical chain or axillae bilaterally.  Gait normal with good balance and coordination.  MSK:  Non tender with full range of motion and good stability and symmetric strength and tone of shoulders, elbows, wrist, hip, knee and ankles bilaterally. 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  Foot exam Severe breakdown of the transverse arch, severe posterior tib insufficiency and post op changes noted.  Hx of bumionectomy on left foot still fibular deviation.  Fibular deviation and hammertoe on right foot with fungal changes between 1st and second toes.  NVI    Impression and Recommendations:     This case required medical decision making of moderate complexity.

## 2014-08-04 NOTE — Assessment & Plan Note (Signed)
Sever collase and sever flat foot.  Discussed proper shoes, icing, dorsal. Has as well as fungal cream. Patient will try to make these changes. We discussed in the fall we may want to make her custom orthotics with further compression. The care of this all the better we stated.

## 2014-08-04 NOTE — Progress Notes (Signed)
Pre visit review using our clinic review tool, if applicable. No additional management support is needed unless otherwise documented below in the visit note. 

## 2014-08-05 ENCOUNTER — Encounter: Payer: Self-pay | Admitting: *Deleted

## 2014-08-11 ENCOUNTER — Ambulatory Visit: Payer: Medicare Other | Admitting: Family Medicine

## 2014-08-26 DIAGNOSIS — H35721 Serous detachment of retinal pigment epithelium, right eye: Secondary | ICD-10-CM | POA: Diagnosis not present

## 2014-08-26 DIAGNOSIS — H3532 Exudative age-related macular degeneration: Secondary | ICD-10-CM | POA: Diagnosis not present

## 2014-09-11 DIAGNOSIS — M1990 Unspecified osteoarthritis, unspecified site: Secondary | ICD-10-CM | POA: Diagnosis not present

## 2014-09-11 DIAGNOSIS — I10 Essential (primary) hypertension: Secondary | ICD-10-CM | POA: Diagnosis not present

## 2014-09-11 DIAGNOSIS — M8588 Other specified disorders of bone density and structure, other site: Secondary | ICD-10-CM | POA: Diagnosis not present

## 2014-09-11 DIAGNOSIS — Z0000 Encounter for general adult medical examination without abnormal findings: Secondary | ICD-10-CM | POA: Diagnosis not present

## 2014-09-11 DIAGNOSIS — R7301 Impaired fasting glucose: Secondary | ICD-10-CM | POA: Diagnosis not present

## 2014-09-11 DIAGNOSIS — E782 Mixed hyperlipidemia: Secondary | ICD-10-CM | POA: Diagnosis not present

## 2014-09-11 DIAGNOSIS — Z1389 Encounter for screening for other disorder: Secondary | ICD-10-CM | POA: Diagnosis not present

## 2014-09-16 DIAGNOSIS — M8589 Other specified disorders of bone density and structure, multiple sites: Secondary | ICD-10-CM | POA: Diagnosis not present

## 2014-09-16 DIAGNOSIS — Z780 Asymptomatic menopausal state: Secondary | ICD-10-CM | POA: Diagnosis not present

## 2014-10-01 DIAGNOSIS — H3532 Exudative age-related macular degeneration: Secondary | ICD-10-CM | POA: Diagnosis not present

## 2014-11-09 DIAGNOSIS — H353221 Exudative age-related macular degeneration, left eye, with active choroidal neovascularization: Secondary | ICD-10-CM | POA: Diagnosis not present

## 2014-11-17 ENCOUNTER — Ambulatory Visit (INDEPENDENT_AMBULATORY_CARE_PROVIDER_SITE_OTHER): Payer: Medicare Other | Admitting: Family Medicine

## 2014-11-17 ENCOUNTER — Other Ambulatory Visit (INDEPENDENT_AMBULATORY_CARE_PROVIDER_SITE_OTHER): Payer: Medicare Other

## 2014-11-17 ENCOUNTER — Encounter: Payer: Self-pay | Admitting: Family Medicine

## 2014-11-17 VITALS — BP 128/72 | HR 86 | Ht 64.5 in | Wt 134.0 lb

## 2014-11-17 DIAGNOSIS — M7061 Trochanteric bursitis, right hip: Secondary | ICD-10-CM | POA: Diagnosis not present

## 2014-11-17 DIAGNOSIS — M25551 Pain in right hip: Secondary | ICD-10-CM

## 2014-11-17 NOTE — Progress Notes (Signed)
Pre visit review using our clinic review tool, if applicable. No additional management support is needed unless otherwise documented below in the visit note. 

## 2014-11-17 NOTE — Patient Instructions (Signed)
Always good to see you Ice 20 minutes 2 times daily. Usually after activity and before bed. pennsaid pinkie amount topically 2 times daily as needed.   Exercises 3 times a week.  Continue to stay active See me again in 4 weeks and if not better we will need to look at the back.

## 2014-11-17 NOTE — Assessment & Plan Note (Signed)
Patient given injection today and tolerated the procedure very well. We discussed icing regimen and home exercises. We discussed topical anti-inflammatories and patient given a trial size. In addition of this we discussed the importance of core strengthening exercises. Differential also includes a lumbar radiculopathy with patient history of arthritis and multiple other joints. Patient continues to have pain we may need to consider further workup of the back including imaging and medications.

## 2014-11-17 NOTE — Progress Notes (Signed)
[name] [place], [place] [address] Phone: [phone] Subjective:     CC: Bilateral foot pain  UXL:KGMWNUUVOZ TANETTA FUHRIMAN is a 74 y.o. female coming in with complaint of bilateral foot pain. Severe breakdown of the foot and multiple different areas with significant arthritis. Patient be following up with a specialist in the near future.  Patient has noticed that she isn't having more of a right hip pain. Patient states that it seems to hurt her sometimes with activity. Sometimes radiation down the posterior aspect the leg. Sometimes back pain seems to be associated with it. Sometimes in a flexed position for a long amount of time can give her discomfort. Does her toe on the lateral aspect of the hip when she touches in the area. Denies any weakness. States that she can sleep comfortable he. Has not taken any medications at this time. Rates the severity of pain a 6 out of 10. Patient was remain active and is finding it difficult.     Past medical history, social, surgical and family history all reviewed in electronic medical record.   Review of Systems: No headache, visual changes, nausea, vomiting, diarrhea, constipation, dizziness, abdominal pain, skin rash, fevers, chills, night sweats, weight loss, swollen lymph nodes, body aches, joint swelling, muscle aches, chest pain, shortness of breath, mood changes.   Objective Blood pressure 128/72, pulse 86, height 5' 4.5" (1.638 m), weight 134 lb (60.782 kg), SpO2 95 %.  General: No apparent distress alert and oriented x3 mood and affect normal, dressed appropriately.  HEENT: Pupils equal, extraocular movements intact  Respiratory: Patient's speak in full sentences and does not appear short of breath  Cardiovascular: No lower extremity edema, non tender, no erythema  Skin: Warm dry intact with no signs of infection or rash on extremities or on axial skeleton.  Abdomen: Soft nontender    Neuro: Cranial nerves II through XII are intact, neurovascularly intact in all extremities with 2+ DTRs and 2+ pulses.  Lymph: No lymphadenopathy of posterior or anterior cervical chain or axillae bilaterally.  Gait normal with good balance and coordination.  MSK:  Non tender with full range of motion and good stability and symmetric strength and tone of shoulders, elbows, wrist, hip, knee and ankles bilaterally. 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 Arthritic changes of multiple other joints.  Foot exam Severe breakdown of the transverse arch, severe posterior tib insufficiency and post op changes noted.  Hx of bumionectomy on left foot still fibular deviation.  Fibular deviation and hammertoe on right foot with fungal changes between 1st and second toes.  NVI  Right hip exam shows the patient does have severe tenderness to palpation over the greater trochanteric area on the right side. Patient does have good range of motion with internal rotation with no pain. Patient does have tightness of the Fabere test on the right side compared to the contralateral side. Neurovascularly intact distally. Negative straight leg test.   MSK US performed of: Right This study was ordered, performed, and interpreted by Charlann Boxer D.O.  Hip: Trochanteric bursa with significant hypoechoic changes and swelling Acetabular labrum visualized and without tears, displacement, or effusion in joint. Femoral neck appears unremarkable without increased power doppler signal along Cortex.  IMPRESSION:  Greater trochanter bursitis   Procedure: Real-time Ultrasound Guided Injection of right greater trochanteric bursitis secondary to patient's body habitus Device: GE Logiq E  Ultrasound guided injection is preferred based studies that show  increased duration, increased effect, greater accuracy, decreased procedural pain, increased response rate, and decreased cost with ultrasound guided versus blind  injection.  Verbal informed consent obtained.  Time-out conducted.  Noted no overlying erythema, induration, or other signs of local infection.  Skin prepped in a sterile fashion.  Local anesthesia: Topical Ethyl chloride.  With sterile technique and under real time ultrasound guidance:  Greater trochanteric area was visualized and patient's bursa was noted. A 22-gauge 3 inch needle was inserted and 4 cc of 0.5% Marcaine and 1 cc of Kenalog 40 mg/dL was injected. Pictures taken Completed without difficulty  Pain immediately resolved suggesting accurate placement of the medication.  Advised to call if fevers/chills, erythema, induration, drainage, or persistent bleeding.  Images permanently stored and available for review in the ultrasound unit.  Impression: Technically successful ultrasound guided injection.     Impression and Recommendations:     This case required medical decision making of moderate complexity.

## 2014-11-23 DIAGNOSIS — M21611 Bunion of right foot: Secondary | ICD-10-CM | POA: Diagnosis not present

## 2014-11-23 DIAGNOSIS — Q6621 Congenital metatarsus primus varus: Secondary | ICD-10-CM | POA: Diagnosis not present

## 2014-12-02 DIAGNOSIS — Z23 Encounter for immunization: Secondary | ICD-10-CM | POA: Diagnosis not present

## 2014-12-15 DIAGNOSIS — H353221 Exudative age-related macular degeneration, left eye, with active choroidal neovascularization: Secondary | ICD-10-CM | POA: Diagnosis not present

## 2014-12-17 DIAGNOSIS — Z961 Presence of intraocular lens: Secondary | ICD-10-CM | POA: Diagnosis not present

## 2014-12-17 DIAGNOSIS — H353230 Exudative age-related macular degeneration, bilateral, stage unspecified: Secondary | ICD-10-CM | POA: Diagnosis not present

## 2015-01-11 ENCOUNTER — Telehealth: Payer: Self-pay

## 2015-01-11 ENCOUNTER — Encounter: Payer: Self-pay | Admitting: Family Medicine

## 2015-01-11 ENCOUNTER — Ambulatory Visit (INDEPENDENT_AMBULATORY_CARE_PROVIDER_SITE_OTHER): Payer: Medicare Other | Admitting: Family Medicine

## 2015-01-11 ENCOUNTER — Ambulatory Visit (INDEPENDENT_AMBULATORY_CARE_PROVIDER_SITE_OTHER)
Admission: RE | Admit: 2015-01-11 | Discharge: 2015-01-11 | Disposition: A | Discharge status: Home / Self Care | Payer: Medicare Other | Source: Ambulatory Visit | Attending: Family Medicine | Admitting: Family Medicine

## 2015-01-11 VITALS — BP 140/72 | HR 92 | Ht 64.5 in | Wt 135.0 lb

## 2015-01-11 DIAGNOSIS — M545 Low back pain, unspecified: Secondary | ICD-10-CM

## 2015-01-11 DIAGNOSIS — G5701 Lesion of sciatic nerve, right lower limb: Secondary | ICD-10-CM

## 2015-01-11 DIAGNOSIS — M47816 Spondylosis without myelopathy or radiculopathy, lumbar region: Secondary | ICD-10-CM | POA: Diagnosis not present

## 2015-01-11 MED ORDER — GABAPENTIN 100 MG PO CAPS: 100.0000 mg | ORAL_CAPSULE | Freq: Every day | ORAL | Status: DC

## 2015-01-11 MED ORDER — TIZANIDINE HCL 2 MG PO TABS: 2.0000 mg | ORAL_TABLET | Freq: Two times a day (BID) | ORAL | Status: DC | PRN

## 2015-01-11 NOTE — Progress Notes (Signed)
  [name] [address] [place], [place] [address] Phone: [phone] Subjective:     CC: Bilateral foot pain  [id number] Sarah Castro is a 74 y.o. female coming in with  Patient has noticed that she isn't having more of a right hip pain. Patient was found to have more of a greater trochanteric bursitis. Patient was given an injection approximately 2 months ago. Patient states the side of her hip is feeling very well. Patient is having more right-sided buttocks pain.states it seems worse after walking for some time as well as standing for some time. Seems to be better when she sits. Denies any significant weakness but states that her leg feels fatigued. Patient denies it giving out on her. Denies any back pain that seems to be associated with it. Does have chronic back pain though. States sometimes it can be uncomfortable at night. Has to change positions sometimes with sitting. Has not tried any medications on a regular basis except for taking some over-the-counter medications occasionally.     Past medical history, social, surgical and family history all reviewed in electronic medical record.   Review of Systems: No headache, visual changes, nausea, vomiting, diarrhea, constipation, dizziness, abdominal pain, skin rash, fevers, chills, night sweats, weight loss, swollen lymph nodes, body aches, joint swelling, muscle aches, chest pain, shortness of breath, mood changes.   Objective Blood pressure 140/72, pulse 92, height 5' 4.5" (1.638 m), weight 135 lb (61.236 kg), SpO2 96 %.  General: No apparent distress alert and oriented x3 mood and affect normal, dressed appropriately.  HEENT: Pupils equal, extraocular movements intact  Respiratory: Patient's speak in full sentences and does not appear short of breath  Cardiovascular: No lower extremity edema, non tender, no erythema  Skin: Warm dry intact with no signs of infection or rash on extremities or  on axial skeleton.  Abdomen: Soft nontender  Neuro: Cranial nerves II through XII are intact, neurovascularly intact in all extremities with 2+ DTRs and 2+ pulses.  Lymph: No lymphadenopathy of posterior or anterior cervical chain or axillae bilaterally.  Gait normal with good balance and coordination.  MSK:  Non tender with full range of motion and good stability and symmetric strength and tone of shoulders, elbows, wrist, hip, knee and ankles bilaterally. 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 Arthritic changes of multiple other joints.  Foot exam Severe breakdown of the transverse arch, severe posterior tib insufficiency and post op changes noted.  Hx of bumionectomy on left foot still fibular deviation.  Fibular deviation and hammertoe on right foot with fungal changes between 1st and second toes.  NVI  Right hip exam shows the patient does have severe tenderness to palpation over piriformis area on the right side. No pain over the greater trochanteric area. Patient does have good range of motion with internal rotation with no pain. Patient does have tightness of the Fabere test on the right side compared to the contralateral side. Neurovascularly intact distally. Negative straight leg test.       Impression and Recommendations:     This case required medical decision making of moderate complexity.

## 2015-01-11 NOTE — Patient Instructions (Addendum)
Great to see you! Happy holidays!  Gabapentin 100mg  at night Tanazidine 2 mg up to 2 times daily if needed Duexis 3 times daily for 3 days New exercises 3 times a week.  Tennis ball back right pocket See me again in 2ish weeks and if not better we will do injection.

## 2015-01-11 NOTE — Progress Notes (Signed)
Pre visit review using our clinic review tool, if applicable. No additional management support is needed unless otherwise documented below in the visit note. 

## 2015-01-11 NOTE — Telephone Encounter (Signed)
Left message to continue same plan as discussed in office and that if she would like the xray results to call us back.

## 2015-01-11 NOTE — Assessment & Plan Note (Signed)
Piriformis Syndrome  Using an anatomical model, reviewed with the patient the structures involved and how they related to diagnosis. The patient indicated understanding.   The patient was given a handout from Dr. Arne Cleveland book "The Sports Medicine Patient Advisor" describing the anatomy and rehabilitation of the following condition: Piriformis Syndrome  Also given a handout with more extensive Piriformis stretching, hip flexor and abductor strengthening, ham stretching  Rec deep massage, explained self-massage with ball Differential also includes lumbar radiculopathy. X-rays ordered today for further evaluation. Patient given medications as per patient instructions. Warned of potential side effects. Patient will try this first and we'll see how she responds. Patient will come back in 2 weeks. If continuing have pain we will consider injection in the piriformis for diagnostic as well as therapeutic.

## 2015-01-19 DIAGNOSIS — H353221 Exudative age-related macular degeneration, left eye, with active choroidal neovascularization: Secondary | ICD-10-CM | POA: Diagnosis not present

## 2015-01-26 ENCOUNTER — Encounter: Payer: Self-pay | Admitting: Family Medicine

## 2015-01-26 ENCOUNTER — Other Ambulatory Visit (INDEPENDENT_AMBULATORY_CARE_PROVIDER_SITE_OTHER): Payer: Medicare Other

## 2015-01-26 ENCOUNTER — Ambulatory Visit (INDEPENDENT_AMBULATORY_CARE_PROVIDER_SITE_OTHER): Payer: Medicare Other | Admitting: Family Medicine

## 2015-01-26 VITALS — BP 136/78 | HR 87 | Ht 64.5 in | Wt 136.0 lb

## 2015-01-26 DIAGNOSIS — G5701 Lesion of sciatic nerve, right lower limb: Secondary | ICD-10-CM

## 2015-01-26 NOTE — Assessment & Plan Note (Signed)
Patient given an injection today and tolerated the procedure very well. I do think the differential includes the spinal stenosis and if patient does not make significant strides I would like to do further follow-up on this. Patient will take 48 hours hiatus from exercises. Then patient will start this regularly. Continue the gabapentin, vitamin D, as well as icing protocol and muscle relaxer as needed. Patient will remain active. I like her to follow-up again in 2-3 weeks for further evaluation and treatment.  Spent  25 minutes with patient face-to-face and had greater than 50% of counseling including as described above in assessment and plan.

## 2015-01-26 NOTE — Progress Notes (Signed)
[name] [address] [place], [place] [address] Phone: [phone] Subjective:     CC: Bilateral foot pain  [id number] Sarah Castro is a 74 y.o. female coming in with  Complaint of right buttocks pain. Patient states that now it seems to be bilateral and worse after significant amount of activity. Still worse on the right side. States that he can give her radicular symptoms down the leg. Describes it as a cramping sensation. Has stopped her from certain activities over the course of the recent time. Denies any fevers chills or any abnormal weight loss. Continues to wear the orthotics fairly regularly.     Past medical history, social, surgical and family history all reviewed in electronic medical record.   Review of Systems: No headache, visual changes, nausea, vomiting, diarrhea, constipation, dizziness, abdominal pain, skin rash, fevers, chills, night sweats, weight loss, swollen lymph nodes, body aches, joint swelling, muscle aches, chest pain, shortness of breath, mood changes.   Objective Blood pressure 136/78, pulse 87, height 5' 4.5" (1.638 m), weight 136 lb (61.689 kg), SpO2 96 %.  General: No apparent distress alert and oriented x3 mood and affect normal, dressed appropriately.  HEENT: Pupils equal, extraocular movements intact  Respiratory: Patient's speak in full sentences and does not appear short of breath  Cardiovascular: No lower extremity edema, non tender, no erythema  Skin: Warm dry intact with no signs of infection or rash on extremities or on axial skeleton.  Abdomen: Soft nontender  Neuro: Cranial nerves II through XII are intact, neurovascularly intact in all extremities with 2+ DTRs and 2+ pulses.  Lymph: No lymphadenopathy of posterior or anterior cervical chain or axillae bilaterally.  Gait normal with good balance and coordination.  MSK:  Non tender with full range of motion and good stability and symmetric strength  and tone of shoulders, elbows, wrist, hip, knee and ankles bilaterally. 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 Arthritic changes of multiple other joints.  Foot exam Severe breakdown of the transverse arch, severe posterior tib insufficiency and post op changes noted.  Hx of bumionectomy on left foot still fibular deviation.  Fibular deviation and hammertoe on right foot with fungal changes between 1st and second toes.  NVI  Right hip exam shows the patient does have severe tenderness to palpation over piriformis area on the right side. Possibly worse than previous exam No pain over the greater trochanteric area. Patient does have good range of motion with internal rotation with no pain. Patient does have tightness of the Fabere test on the right side compared to the contralateral side. Neurovascularly intact distally. Negative straight leg test.   Procedure: Real-time Ultrasound Guided Injection of right piriformis tendon sheath Device: GE Logiq E  Ultrasound guided injection is preferred based studies that show increased duration, increased effect, greater accuracy, decreased procedural pain, increased response rate, and decreased cost with ultrasound guided versus blind injection.  Verbal informed consent obtained.  Time-out conducted.  Noted no overlying erythema, induration, or other signs of local infection.  Skin prepped in a sterile fashion.  Local anesthesia: Topical Ethyl chloride.  With sterile technique and under real time ultrasound guidance:  With a 3 inch 21-gauge needle patient was injected with a total of 2 mL of 0.5% Marcaine and 1 mL of Kenalog 40 mg/dL within the tendon sheath of the piriformis. Completed without difficulty  Pain immediately resolved suggesting accurate placement of the medication.  Advised to call if fevers/chills,  erythema, induration, drainage, or persistent bleeding.  Images permanently stored and available for review in the ultrasound  unit.  Impression: Technically successful ultrasound guided injection.    Impression and Recommendations:     This case required medical decision making of moderate complexity.

## 2015-01-26 NOTE — Progress Notes (Signed)
Pre visit review using our clinic review tool, if applicable. No additional management support is needed unless otherwise documented below in the visit note. 

## 2015-01-26 NOTE — Patient Instructions (Signed)
Good to see you Ice in 6 hours and aleve Start exercises again in 2 days Wish you happy holidays See me again in 3 weeks if not near perfect

## 2015-02-16 ENCOUNTER — Ambulatory Visit: Payer: Medicare Other | Admitting: Family Medicine

## 2015-02-23 DIAGNOSIS — Z1231 Encounter for screening mammogram for malignant neoplasm of breast: Secondary | ICD-10-CM | POA: Diagnosis not present

## 2015-03-02 DIAGNOSIS — H353221 Exudative age-related macular degeneration, left eye, with active choroidal neovascularization: Secondary | ICD-10-CM | POA: Diagnosis not present

## 2015-03-02 DIAGNOSIS — H35721 Serous detachment of retinal pigment epithelium, right eye: Secondary | ICD-10-CM | POA: Diagnosis not present

## 2015-03-08 DIAGNOSIS — L738 Other specified follicular disorders: Secondary | ICD-10-CM | POA: Diagnosis not present

## 2015-03-16 DIAGNOSIS — M1990 Unspecified osteoarthritis, unspecified site: Secondary | ICD-10-CM | POA: Diagnosis not present

## 2015-03-16 DIAGNOSIS — I10 Essential (primary) hypertension: Secondary | ICD-10-CM | POA: Diagnosis not present

## 2015-03-16 DIAGNOSIS — E782 Mixed hyperlipidemia: Secondary | ICD-10-CM | POA: Diagnosis not present

## 2015-03-16 DIAGNOSIS — R7301 Impaired fasting glucose: Secondary | ICD-10-CM | POA: Diagnosis not present

## 2015-04-20 DIAGNOSIS — H353221 Exudative age-related macular degeneration, left eye, with active choroidal neovascularization: Secondary | ICD-10-CM | POA: Diagnosis not present

## 2015-06-08 DIAGNOSIS — H353221 Exudative age-related macular degeneration, left eye, with active choroidal neovascularization: Secondary | ICD-10-CM | POA: Diagnosis not present

## 2015-08-04 DIAGNOSIS — H353221 Exudative age-related macular degeneration, left eye, with active choroidal neovascularization: Secondary | ICD-10-CM | POA: Diagnosis not present

## 2015-09-14 DIAGNOSIS — E782 Mixed hyperlipidemia: Secondary | ICD-10-CM | POA: Diagnosis not present

## 2015-09-14 DIAGNOSIS — M8580 Other specified disorders of bone density and structure, unspecified site: Secondary | ICD-10-CM | POA: Diagnosis not present

## 2015-09-14 DIAGNOSIS — K635 Polyp of colon: Secondary | ICD-10-CM | POA: Diagnosis not present

## 2015-09-14 DIAGNOSIS — K589 Irritable bowel syndrome without diarrhea: Secondary | ICD-10-CM | POA: Diagnosis not present

## 2015-09-14 DIAGNOSIS — Z1389 Encounter for screening for other disorder: Secondary | ICD-10-CM | POA: Diagnosis not present

## 2015-09-14 DIAGNOSIS — Z0000 Encounter for general adult medical examination without abnormal findings: Secondary | ICD-10-CM | POA: Diagnosis not present

## 2015-09-14 DIAGNOSIS — I10 Essential (primary) hypertension: Secondary | ICD-10-CM | POA: Diagnosis not present

## 2015-09-14 DIAGNOSIS — R739 Hyperglycemia, unspecified: Secondary | ICD-10-CM | POA: Diagnosis not present

## 2015-09-14 DIAGNOSIS — R7303 Prediabetes: Secondary | ICD-10-CM | POA: Diagnosis not present

## 2015-09-14 DIAGNOSIS — M1990 Unspecified osteoarthritis, unspecified site: Secondary | ICD-10-CM | POA: Diagnosis not present

## 2015-10-04 DIAGNOSIS — H353221 Exudative age-related macular degeneration, left eye, with active choroidal neovascularization: Secondary | ICD-10-CM | POA: Diagnosis not present

## 2015-10-15 DIAGNOSIS — Z23 Encounter for immunization: Secondary | ICD-10-CM | POA: Diagnosis not present

## 2015-10-26 NOTE — Progress Notes (Signed)
[name] [address] [place], [place] [address] Phone: [phone] Subjective:      CC: Left knee pain  [id number]  Sarah Castro is a 75 y.o. female coming in with complaint of knee pain. Patient has not complained of her knee pain for greater than 2 years. Patient does have a history of degenerative arthritis of the knee as well as a leg length discrepancy. Patient states she has been in the hospital recently because of her husband and admitted. Patient has been doing a lot of walking and his notices more aggravation in the left knee. Has been wearing a brace and using a cane. Patient states that it is severe enough that can wake her up at night. Has had a couple times when the knee feels like it was going to give out on her. No swelling though. No radiation of pain down the leg.     Past Medical History:  Diagnosis Date  . Arthritis   . Hypercholesteremia   . Hypertension   . PONV (postoperative nausea and vomiting)    Past Surgical History:  Procedure Laterality Date  . BUNIONECTOMY  2013  . CATARACT EXTRACTION     right  . [place]  2013  . HEMATOMA EVACUATION  5 years ago  . KNEE ARTHROSCOPY     right  . LAPAROSCOPIC APPENDECTOMY N/A 10/24/2012   Procedure: APPENDECTOMY LAPAROSCOPIC;  Surgeon: Rolm Bookbinder, MD;  Location: [facility];  Service: General;  Laterality: N/A;  . LAPAROSCOPIC APPENDECTOMY    . POSTERIOR TIBIAL TENDON REPAIR  1998  . TONSILLECTOMY   age 8  . 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 Marital status: Married    Spouse name: N/A  . Number of children: N/A  . Years of education: N/A   Social History Main Topics  . Smoking status: Former Smoker    Packs/day: 0.50    Years: 20.00    Types: Cigarettes    Quit date: 02/07/2011  .  Smokeless tobacco: Never Used  . Alcohol use Yes     Comment: 1 drink per day  . Drug use: No  . Sexual activity: Not Asked   Other Topics Concern  . None   Social History Narrative  . None   Allergies  Allergen Reactions  . Sulfa Antibiotics Rash   Family History  Problem Relation Age of Onset  . Cancer Father     throat  . Cancer Sister     non hodgkins lymphoma  . Colon cancer Maternal Grandmother     Past medical history, social, surgical and family history all reviewed in electronic medical record.  No pertanent information unless stated regarding to the chief complaint.   Review of Systems: No headache, visual changes, nausea, vomiting, diarrhea, constipation, dizziness, abdominal pain, skin rash, fevers, chills, night sweats, weight loss, swollen lymph nodes,chest pain, shortness of breath, mood changes.   Objective  Blood pressure 132/82, pulse 80, weight 132 lb (59.9 kg), SpO2 97 %.  General: No apparent distress alert and oriented x3 mood and affect normal, dressed appropriately.  HEENT: Pupils equal, extraocular movements intact  Respiratory: Patient's speak in full sentences and does not appear short of breath  Cardiovascular: No lower extremity edema, non tender, no erythema  Skin: Warm dry intact  with no signs of infection or rash on extremities or on axial skeleton.  Abdomen: Soft nontender  Neuro: Cranial nerves II through XII are intact, neurovascularly intact in all extremities with 2+ DTRs and 2+ pulses.  Lymph: No lymphadenopathy of posterior or anterior cervical chain or axillae bilaterally.  Gait normal with good balance and coordination.  MSK:  Non tender with full range of motion and good stability and symmetric strength and tone of shoulders, elbows, wrist, hip, and ankles bilaterally. Arthritic changes of multiple joints. Knee:left Douglas deformity of the knee noted Tender to palpation over the medial joint line. ROM full in flexion and extension  and lower leg rotation. Instability noted with valgus force. Negative Mcmurray's, Apley's, and Thessalonian tests. painful patellar compression. Patellar glide with mildcrepitus. Patellar and quadriceps tendons unremarkable. Hamstring and quadriceps strength is normal.  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    After informed written and verbal consent, patient was seated on exam table. Left knee was prepped with alcohol swab and utilizing anterolateral approach, patient's left knee space was injected with 4:1  marcaine 0.5%: Kenalog 40mg /dL. Patient tolerated the procedure well without immediate complications. Impression and Recommendations:     This case required medical decision making of moderate complexity.      Note: This dictation was prepared with Dragon dictation along with smaller phrase technology. Any transcriptional errors that result from this process are unintentional.

## 2015-10-27 ENCOUNTER — Ambulatory Visit (INDEPENDENT_AMBULATORY_CARE_PROVIDER_SITE_OTHER): Payer: Medicare Other | Admitting: Family Medicine

## 2015-10-27 ENCOUNTER — Encounter: Payer: Self-pay | Admitting: Family Medicine

## 2015-10-27 DIAGNOSIS — M1712 Unilateral primary osteoarthritis, left knee: Secondary | ICD-10-CM

## 2015-10-27 MED ORDER — GABAPENTIN 100 MG PO CAPS: 100.0000 mg | ORAL_CAPSULE | Freq: Every day | ORAL | 3 refills | Status: DC

## 2015-10-27 NOTE — Assessment & Plan Note (Signed)
Patient given injection today. Tolerated procedure well. We discussed icing regimen and home exercises. Given gabapentin with patient's increasing the locking I'm concerned that her back will cause aggravation as well. We discussed icing regimen and given trial topical anti-inflammatories. Patient declined a custom brace at this time. Patient come back again in 4-6 weeks. She could be a candidate for viscous supple mentation.

## 2015-10-27 NOTE — Patient Instructions (Addendum)
Goo to see you  Gabapentin 100mg  at night pennsaid pinkie amount topically 2 times daily as needed.  Injected the knee and it should help  We will consider a custom brace if not a lot better.  See me again in 6 weeks if needed.

## 2015-11-18 ENCOUNTER — Encounter: Payer: Self-pay | Admitting: Gastroenterology

## 2015-12-07 ENCOUNTER — Encounter: Payer: Self-pay | Admitting: Gastroenterology

## 2015-12-07 NOTE — Progress Notes (Signed)
[facility] [address], [place] [address] Phone: [phone] Subjective:      CC: Left knee pain f/u  [id number]  Sarah Castro is a 75 y.o. female coming in with complaint of knee pain.Found to have degenerative changes of the left knee. Patient was given an injection 6 weeks ago. Patient states He seems to be doing better. Having some mild increase in back pain. Some radiation down both legs. Patient will be going out of the state and will be doing a lot of walking. Patient will be with other individuals and does not want to work slowly come down. Sometimes feels like she has difficulty doing this. Has had difficult he taking care of herself because her husband has been sick.      Past medical, surgical, social and family history all reviewed as of 12/08/15.  No pertanent information unless stated regarding to the chief complaint.  Past Medical History:  Diagnosis Date  . Arthritis   . Hypercholesteremia   . Hypertension   . PONV (postoperative nausea and vomiting)    Past Surgical History:  Procedure Laterality Date  . BUNIONECTOMY  2013  . CATARACT EXTRACTION     right  . [place]  2013  . HEMATOMA EVACUATION  5 years ago  . KNEE ARTHROSCOPY     right  . LAPAROSCOPIC APPENDECTOMY N/A 10/24/2012   Procedure: APPENDECTOMY LAPAROSCOPIC;  Surgeon: Rolm Bookbinder, MD;  Location: [facility];  Service: General;  Laterality: N/A;  . LAPAROSCOPIC APPENDECTOMY    . POSTERIOR TIBIAL TENDON REPAIR  1998  . TONSILLECTOMY   age 48  . 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 Marital status: Married    Spouse name: N/A  . Number of children: N/A  . Years of education: N/A   Social History Main Topics  . Smoking status: Former Smoker    Packs/day: 0.50    Years:  20.00    Types: Cigarettes    Quit date: 02/07/2011  . Smokeless tobacco: Never Used  . Alcohol use Yes     Comment: 1 drink per day  . Drug use: No  . Sexual activity: Not Asked   Other Topics Concern  . None   Social History Narrative  . None   Allergies  Allergen Reactions  . Sulfa Antibiotics Rash   Family History  Problem Relation Age of Onset  . Cancer Father     throat  . Cancer Sister     non hodgkins lymphoma  . Colon cancer Maternal Grandmother      Review of systems reviewed as of 12/08/15 Review of Systems: No headache, visual changes, nausea, vomiting, diarrhea, constipation, dizziness, abdominal pain, skin rash, fevers, chills, night sweats, weight loss, swollen lymph nodes, body aches, joint swelling, muscle aches, chest pain, shortness of breath, mood changes.   Objective  Blood pressure 132/76, pulse 74, height 5\' 5"  (1.651 m), weight 128 lb 9.6 oz (58.3 kg). Systems examined below as of 12/08/15   General: No apparent distress alert and oriented x3 mood and affect normal, dressed appropriately.  HEENT: Pupils equal, extraocular movements intact  Respiratory: Patient's speak in full sentences and does not appear short of breath  Cardiovascular: No lower extremity edema, non tender, no erythema  Skin: Warm dry intact  with no signs of infection or rash on extremities or on axial skeleton.  Abdomen: Soft nontender  Neuro: Cranial nerves II through XII are intact, neurovascularly intact in all extremities with 2+ DTRs and 2+ pulses.  Lymph: No lymphadenopathy of posterior or anterior cervical chain or axillae bilaterally.  Gait normal with good balance and coordination.    MSK:  Non tender with full range of motion and good stability and symmetric strength and tone of shoulders, elbows, wrist, hip, and ankles bilaterally. Arthritic changes of multiple joints. Severe arthritic changes of patient's phalanx of the toes as well as the midfoot. Knee:left Valgus  deformity of the knee noted Minimal tenderness.. ROM full in flexion and extension and lower leg rotation. Instability noted with valgus force. Negative Mcmurray's, Apley's, and Thessalonian tests.  mild painful patellar compression. Patellar glide with mild crepitus. Patellar and quadriceps tendons unremarkable. Hamstring and quadriceps strength is normal.  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 Mild improvement   Back exam shows the patient has only 5 of extension. Full flexion. Full round range of motion with rotation. Mild crepitus. Mild increasing kyphosis. Tenderness to palpation in the paraspinal musculature. Significant tightness of hamstrings bilaterally but denies any radicular symptoms. Patient does have atrophy of the femurs bilaterally. Strength is 4 out of 5 but symmetric of the lower extremities .   Impression and Recommendations:     This case required medical decision making of moderate complexity.      Note: This dictation was prepared with Dragon dictation along with smaller phrase technology. Any transcriptional errors that result from this process are unintentional.

## 2015-12-08 ENCOUNTER — Encounter: Payer: Self-pay | Admitting: Family Medicine

## 2015-12-08 ENCOUNTER — Ambulatory Visit (INDEPENDENT_AMBULATORY_CARE_PROVIDER_SITE_OTHER): Payer: Medicare Other | Admitting: Family Medicine

## 2015-12-08 DIAGNOSIS — M5416 Radiculopathy, lumbar region: Secondary | ICD-10-CM | POA: Insufficient documentation

## 2015-12-08 DIAGNOSIS — M2141 Flat foot [pes planus] (acquired), right foot: Secondary | ICD-10-CM

## 2015-12-08 DIAGNOSIS — M2142 Flat foot [pes planus] (acquired), left foot: Secondary | ICD-10-CM | POA: Diagnosis not present

## 2015-12-08 DIAGNOSIS — M1712 Unilateral primary osteoarthritis, left knee: Secondary | ICD-10-CM

## 2015-12-08 MED ORDER — PREDNISONE 20 MG PO TABS: 40.0000 mg | ORAL_TABLET | Freq: Every day | ORAL | 0 refills | Status: DC

## 2015-12-08 NOTE — Assessment & Plan Note (Signed)
Stable. Can repeat every 3-4 months. Last injection 10/27/2015

## 2015-12-08 NOTE — Assessment & Plan Note (Signed)
Concerned than somewhat patient's leg pain is secondary to more of a spinal stenosis. In significant tightness of the hamstrings but patient declined saying that she has a true positive straight leg test. Patient seems to be getting fatigued with walking. Has some atrophy of the quadriceps bilaterally. COntinue gabapentin regularly and we suggested. Patient was given prednisone for any breaks or pain when she is out of the state. Patient will come back and see me again after the trip to [facility] she does well.

## 2015-12-08 NOTE — Assessment & Plan Note (Signed)
Seems stable at this time. Continue to monitor. Can repeat injections every 3-4 months if needed. Declined bracing.

## 2015-12-08 NOTE — Patient Instructions (Signed)
Good to see yo u Ice still at the end of the day  Prednsione 40mg  daily for 5 days while in [place].  Do not take aleve with it at the same time.  Gabapentin 100mg  at night will help your back  Watch the diet and I want you 50-70grams of protein a day at minimum.  See me again after [place] if anything is worse or not better.  Have fun!

## 2015-12-08 NOTE — Assessment & Plan Note (Signed)
Encourage proper shoes

## 2015-12-13 DIAGNOSIS — H353221 Exudative age-related macular degeneration, left eye, with active choroidal neovascularization: Secondary | ICD-10-CM | POA: Diagnosis not present

## 2016-01-11 NOTE — Progress Notes (Signed)
[name] [address] [place], [place] [address] Phone: [phone] Subjective:      CC: Left knee pain f/u  [id number]  Sarah Castro is a 75 y.o. female coming in with complaint of knee pain.Found to have degenerative changes of the left knee. Patient was given an injection, cortisone, 10 weeks ago in the left knee. Patient was to continue conservative therapy. Unfortunate patient did have the recent passing away of her husband. Patient did get any [place] and was doing well with the knee. No pain whatsoever. Some instability if not wearing the brace.  Patient is also complaining of low back pain. Has had this previously. Has known degenerative disc disease. 10 having symptoms going down the right leg. States that she can walk 4 proximal wing 10 minutes and the need to take a break secondary to the discomfort. When she saw down seems to get better. Sitting seem to make it better as well. all way to her lower leg.       Past medical, surgical, social and family history all reviewed as of 01/12/16.  No pertanent information unless stated regarding to the chief complaint.  Past Medical History:  Diagnosis Date  . Arthritis   . Hypercholesteremia   . Hypertension   . PONV (postoperative nausea and vomiting)    Past Surgical History:  Procedure Laterality Date  . BUNIONECTOMY  2013  . CATARACT EXTRACTION     right  . [place]  2013  . HEMATOMA EVACUATION  5 years ago  . KNEE ARTHROSCOPY     right  . LAPAROSCOPIC APPENDECTOMY N/A 10/24/2012   Procedure: APPENDECTOMY LAPAROSCOPIC;  Surgeon: Rolm Bookbinder, MD;  Location: [facility];  Service: General;  Laterality: N/A;  . LAPAROSCOPIC APPENDECTOMY    . POSTERIOR TIBIAL TENDON REPAIR  1998  . TONSILLECTOMY   age 33  . 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 Marital status: Married    Spouse name: N/A  . Number of children: N/A  . Years of education: N/A   Social History Main Topics  . Smoking status: Former Smoker    Packs/day: 0.50    Years: 20.00    Types: Cigarettes    Quit date: 02/07/2011  . Smokeless tobacco: Never Used  . Alcohol use Yes     Comment: 1 drink per day  . Drug use: No  . Sexual activity: Not Asked   Other Topics Concern  . None   Social History Narrative  . None   Allergies  Allergen Reactions  . Sulfa Antibiotics Rash   Family History  Problem Relation Age of Onset  . Cancer Father     throat  . Cancer Sister     non hodgkins lymphoma  . Colon cancer Maternal Grandmother      Review of systems reviewed as of 01/12/16 Review of Systems: No headache, visual changes, nausea, vomiting, diarrhea, constipation, dizziness, abdominal pain, skin rash, fevers, chills, night sweats, weight loss, swollen lymph nodes, body aches, joint swelling, muscle aches, chest pain, shortness of breath, mood changes.   Objective  Blood pressure 126/78, pulse 100, weight 132 lb (59.9 kg), SpO2 97 %.   Systems examined below as of 01/12/16 General: NAD A&O x3 mood, affect normal  HEENT: Pupils equal, extraocular  movements intact no nystagmus Respiratory: not short of breath at rest or with speaking Cardiovascular: No lower extremity edema, non tender Skin: Warm dry intact with no signs of infection or rash on extremities or on axial skeleton. Abdomen: Soft nontender, no masses Neuro: Cranial nerves  intact, neurovascularly intact in all extremities with 2+ DTRs and 2+ pulses. Lymph: No lymphadenopathy appreciated today  Gait Mild antalgic gait  MSK:  Non tender with full range of motion and good stability and symmetric strength and tone of shoulders, elbows, wrist, hip, and ankles bilaterally. Arthritic changes of multiple joints.  Back Exam:  Inspection: Unremarkable  Motion:  Flexion 25 deg with worsening symptoms, Extension 35 deg, Side Bending to 35 deg bilaterally,  Rotation to 35 deg bilaterally  SLR laying: Negative  XSLR laying: Negative  Palpable tenderness: Tender to palpation and appears palmar musculature of the lumbar spine on the right side. FABER: Positive right side Sensory change: Gross sensation intact to all lumbar and sacral dermatomes.  Reflexes: 2+ at both patellar tendons, 2+ at achilles tendons, Babinski's downgoing.  Strength at foot  Plantar-flexion: 5/5 Dorsi-flexion: 5/5 Eversion: 5/5 Inversion: 5/5  Leg strength  Quad: 5/5 Hamstring: 5/5 Hip flexor: 5/5 Hip abductors: 4-5 strength on the right side Gait unremarkable.  Knee:left Valgus deformity of the knee noted. ROM full in flexion and extension and lower leg rotation. Instability noted with valgus force. Negative Mcmurray's, Apley's, and Thessalonian tests. Nontender over the patella Patellar glide with mild crepitus. Patellar and quadriceps tendons unremarkable. Hamstring and quadriceps strength is normal.  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 Mild improvement     Impression and Recommendations:     This case required medical decision making of moderate complexity.      Note: This dictation was prepared with Dragon dictation along with smaller phrase technology. Any transcriptional errors that result from this process are unintentional.

## 2016-01-12 ENCOUNTER — Ambulatory Visit (INDEPENDENT_AMBULATORY_CARE_PROVIDER_SITE_OTHER): Payer: Medicare Other | Admitting: Family Medicine

## 2016-01-12 ENCOUNTER — Encounter: Payer: Self-pay | Admitting: Family Medicine

## 2016-01-12 DIAGNOSIS — M5416 Radiculopathy, lumbar region: Secondary | ICD-10-CM

## 2016-01-12 DIAGNOSIS — M1712 Unilateral primary osteoarthritis, left knee: Secondary | ICD-10-CM | POA: Diagnosis not present

## 2016-01-12 MED ORDER — GABAPENTIN 100 MG PO CAPS: 200.0000 mg | ORAL_CAPSULE | Freq: Every day | ORAL | 3 refills | Status: DC

## 2016-01-12 NOTE — Assessment & Plan Note (Signed)
Worsening symptoms at this time. Discussed icing regimen and home exercises. Patient increase gabapentin 200 mg at night. We discussed with patient about proper shoes. We discussed worsening symptoms may need advance imaging.

## 2016-01-12 NOTE — Assessment & Plan Note (Signed)
Stable.  No change in management 

## 2016-01-12 NOTE — Patient Instructions (Addendum)
Good to see you  Once again I am so sorry for Sarah Castro is your friend  Gabapentin 200mg  at night, sent in new prescription  Exercises 3 times a week.  Avoid heavy lifting If it gets worse we will need an MRI but I think you will do well.  Happy holidays!  See me when you need me.

## 2016-02-04 ENCOUNTER — Ambulatory Visit (AMBULATORY_SURGERY_CENTER): Payer: Self-pay | Admitting: *Deleted

## 2016-02-04 VITALS — Ht 65.0 in | Wt 134.6 lb

## 2016-02-04 DIAGNOSIS — Z86010 Personal history of colonic polyps: Secondary | ICD-10-CM

## 2016-02-04 MED ORDER — NA SULFATE-K SULFATE-MG SULF 17.5-3.13-1.6 GM/177ML PO SOLN: 1.0000 | Freq: Once | ORAL | 0 refills | Status: AC

## 2016-02-04 NOTE — Progress Notes (Signed)
No allergies to eggs or soy. No problems with anesthesia   Hx PONV  Pt given Emmi instructions for colonoscopy  No oxygen use  No diet drug use

## 2016-02-08 DIAGNOSIS — H353221 Exudative age-related macular degeneration, left eye, with active choroidal neovascularization: Secondary | ICD-10-CM | POA: Diagnosis not present

## 2016-02-15 ENCOUNTER — Encounter: Payer: Self-pay | Admitting: Gastroenterology

## 2016-02-15 ENCOUNTER — Ambulatory Visit (AMBULATORY_SURGERY_CENTER): Payer: Medicare Other | Admitting: Gastroenterology

## 2016-02-15 VITALS — BP 117/72 | HR 61 | Temp 98.4°F | Resp 15 | Ht 65.0 in | Wt 134.0 lb

## 2016-02-15 DIAGNOSIS — K5730 Diverticulosis of large intestine without perforation or abscess without bleeding: Secondary | ICD-10-CM | POA: Diagnosis not present

## 2016-02-15 DIAGNOSIS — D122 Benign neoplasm of ascending colon: Secondary | ICD-10-CM | POA: Diagnosis not present

## 2016-02-15 DIAGNOSIS — D124 Benign neoplasm of descending colon: Secondary | ICD-10-CM

## 2016-02-15 DIAGNOSIS — Z86010 Personal history of colonic polyps: Secondary | ICD-10-CM | POA: Diagnosis present

## 2016-02-15 MED ORDER — SODIUM CHLORIDE 0.9 % IV SOLN: 500.0000 mL | INTRAVENOUS | Status: DC

## 2016-02-15 NOTE — Op Note (Signed)
[facility] Patient Name: Sarah Castro Procedure Date: 02/15/2016 9:17 AM MRN: [id number] Endoscopist: Milus Banister , MD Age: 76 Referring MD:  Date of Birth: [birth year] Gender: Female Account #: [id number] Procedure:                Colonoscopy Indications:              High risk colon cancer surveillance: Personal                            history of colonic polyps; colonoscopy Dr. Ardis Hughs                            2014; 2 subCM adenomas, 1 SSA Medicines:                Monitored Anesthesia Care Procedure:                Pre-Anesthesia Assessment:                           - Prior to the procedure, a History and Physical                            was performed, and patient medications and                            allergies were reviewed. The patient's tolerance of                            previous anesthesia was also reviewed. The risks                            and benefits of the procedure and the sedation                            options and risks were discussed with the patient.                            All questions were answered, and informed consent                            was obtained. Prior Anticoagulants: The patient has                            taken no previous anticoagulant or antiplatelet                            agents. ASA Grade Assessment: II - A patient with                            mild systemic disease. After reviewing the risks                            and benefits, the patient was deemed in  satisfactory condition to undergo the procedure.                           After obtaining informed consent, the colonoscope                            was passed under direct vision. Throughout the                            procedure, the patient's blood pressure, pulse, and                            oxygen saturations were monitored continuously. The                            Model CF-HQ190L ([id number])  scope was introduced                            through the anus and advanced to the the cecum,                            identified by appendiceal orifice and ileocecal                            valve. The colonoscopy was performed without                            difficulty. The patient tolerated the procedure                            well. The quality of the bowel preparation was                            good. The ileocecal valve, appendiceal orifice, and                            rectum were photographed. Scope In: 9:22:04 AM Scope Out: 9:34:49 AM Scope Withdrawal Time: 0 hours 7 minutes 9 seconds  Total Procedure Duration: 0 hours 12 minutes 45 seconds  Findings:                 A 4 mm polyp was found in the descending colon. The                            polyp was sessile. The polyp was removed with a                            cold snare. Resection and retrieval were complete.                           Multiple small-mouthed diverticula were found in                            the left colon.  The exam was otherwise without abnormality on                            direct and retroflexion views. Complications:            No immediate complications. Estimated blood loss:                            None. Estimated Blood Loss:     Estimated blood loss: none. Impression:               - One 4 mm polyp in the descending colon, removed                            with a cold snare. Resected and retrieved.                           - Diverticulosis in the left colon.                           - The examination was otherwise normal on direct                            and retroflexion views. Recommendation:           - Patient has a contact number available for                            emergencies. The signs and symptoms of potential                            delayed complications were discussed with the                            patient. Return to  normal activities tomorrow.                            Written discharge instructions were provided to the                            patient.                           - Resume previous diet.                           - Continue present medications.                           You will probably not need any further colon cancer                            screening tests (including stool testing). These                            types of tests generally stop around age 64-80.  Await final path results for final recommendations. Milus Banister, MD 02/15/2016 9:39:42 AM This report has been signed electronically.

## 2016-02-15 NOTE — Patient Instructions (Signed)
YOU HAD AN ENDOSCOPIC PROCEDURE TODAY AT [facility]:   Refer to the procedure report that was given to you for any specific questions about what was found during the examination.  If the procedure report does not answer your questions, please call your gastroenterologist to clarify.  If you requested that your care partner not be given the details of your procedure findings, then the procedure report has been included in a sealed envelope for you to review at your convenience later.  YOU SHOULD EXPECT: Some feelings of bloating in the abdomen. Passage of more gas than usual.  Walking can help get rid of the air that was put into your GI tract during the procedure and reduce the bloating. If you had a lower endoscopy (such as a colonoscopy or flexible sigmoidoscopy) you may notice spotting of blood in your stool or on the toilet paper. If you underwent a bowel prep for your procedure, you may not have a normal bowel movement for a few days.  Please Note:  You might notice some irritation and congestion in your nose or some drainage.  This is from the oxygen used during your procedure.  There is no need for concern and it should clear up in a day or so.  SYMPTOMS TO REPORT IMMEDIATELY:   Following lower endoscopy (colonoscopy or flexible sigmoidoscopy):  Excessive amounts of blood in the stool  Significant tenderness or worsening of abdominal pains  Swelling of the abdomen that is new, acute  Fever of 100F or higher   For urgent or emergent issues, a gastroenterologist can be reached at any hour by calling [phone].   DIET:  We do recommend a small meal at first, but then you may proceed to your regular diet.  Drink plenty of fluids but you should avoid alcoholic beverages for 24 hours.  Try to increase your fiber, and drink plenty of water  ACTIVITY:  You should plan to take it easy for the rest of today and you should NOT DRIVE or use heavy machinery until tomorrow  (because of the sedation medicines used during the test).    FOLLOW UP: Our staff will call the number listed on your records the next business day following your procedure to check on you and address any questions or concerns that you may have regarding the information given to you following your procedure. If we do not reach you, we will leave a message.  However, if you are feeling well and you are not experiencing any problems, there is no need to return our call.  We will assume that you have returned to your regular daily activities without incident.  If any biopsies were taken you will be contacted by phone or by letter within the next 1-3 weeks.  Please call us at [phone] if you have not heard about the biopsies in 3 weeks.    SIGNATURES/CONFIDENTIALITY: You and/or your care partner have signed paperwork which will be entered into your electronic medical record.  These signatures attest to the fact that that the information above on your After Visit Summary has been reviewed and is understood.  Full responsibility of the confidentiality of this discharge information lies with you and/or your care-partner.  Read all of the handouts given to you by your recovery room nurse.   Thank-you for choosing [facility] for your healthcare needs today.

## 2016-02-15 NOTE — Progress Notes (Signed)
Called to room to assist during endoscopic procedure.  Patient ID and intended procedure confirmed with present staff. Received instructions for my participation in the procedure from the performing physician.  

## 2016-02-15 NOTE — Progress Notes (Signed)
Report to PACU, RN, vss, BBS= Clear.  

## 2016-02-16 ENCOUNTER — Telehealth: Payer: Self-pay

## 2016-02-16 NOTE — Telephone Encounter (Signed)
  Follow up Call-  Call back number 02/15/2016  Post procedure Call Back phone  # [phone]  Permission to leave phone message Yes  Some recent data might be hidden     Patient questions:  Do you have a fever, pain , or abdominal swelling? No. Pain Score  0 *  Have you tolerated food without any problems? Yes.    Have you been able to return to your normal activities? Yes.    Do you have any questions about your discharge instructions: Diet   No. Medications  No. Follow up visit  No.  Do you have questions or concerns about your Care? No.  Actions: * If pain score is 4 or above: No action needed, pain <4.

## 2016-02-22 ENCOUNTER — Encounter: Payer: Self-pay | Admitting: Gastroenterology

## 2016-03-09 DIAGNOSIS — Z1231 Encounter for screening mammogram for malignant neoplasm of breast: Secondary | ICD-10-CM | POA: Diagnosis not present

## 2016-03-20 DIAGNOSIS — M1990 Unspecified osteoarthritis, unspecified site: Secondary | ICD-10-CM | POA: Diagnosis not present

## 2016-03-20 DIAGNOSIS — I10 Essential (primary) hypertension: Secondary | ICD-10-CM | POA: Diagnosis not present

## 2016-03-20 DIAGNOSIS — Z1389 Encounter for screening for other disorder: Secondary | ICD-10-CM | POA: Diagnosis not present

## 2016-03-20 DIAGNOSIS — E782 Mixed hyperlipidemia: Secondary | ICD-10-CM | POA: Diagnosis not present

## 2016-03-20 DIAGNOSIS — R7303 Prediabetes: Secondary | ICD-10-CM | POA: Diagnosis not present

## 2016-04-04 DIAGNOSIS — H353221 Exudative age-related macular degeneration, left eye, with active choroidal neovascularization: Secondary | ICD-10-CM | POA: Diagnosis not present

## 2016-04-10 DIAGNOSIS — H353211 Exudative age-related macular degeneration, right eye, with active choroidal neovascularization: Secondary | ICD-10-CM | POA: Diagnosis not present

## 2016-05-15 DIAGNOSIS — H353211 Exudative age-related macular degeneration, right eye, with active choroidal neovascularization: Secondary | ICD-10-CM | POA: Diagnosis not present

## 2016-05-29 DIAGNOSIS — H353211 Exudative age-related macular degeneration, right eye, with active choroidal neovascularization: Secondary | ICD-10-CM | POA: Diagnosis not present

## 2016-05-29 DIAGNOSIS — H353221 Exudative age-related macular degeneration, left eye, with active choroidal neovascularization: Secondary | ICD-10-CM | POA: Diagnosis not present

## 2016-06-12 ENCOUNTER — Ambulatory Visit (INDEPENDENT_AMBULATORY_CARE_PROVIDER_SITE_OTHER): Payer: Medicare Other | Admitting: Sports Medicine

## 2016-06-12 ENCOUNTER — Encounter: Payer: Self-pay | Admitting: Sports Medicine

## 2016-06-12 VITALS — BP 170/100 | HR 93 | Ht 65.0 in | Wt 135.4 lb

## 2016-06-12 DIAGNOSIS — M1712 Unilateral primary osteoarthritis, left knee: Secondary | ICD-10-CM

## 2016-06-12 DIAGNOSIS — M7061 Trochanteric bursitis, right hip: Secondary | ICD-10-CM

## 2016-06-12 DIAGNOSIS — M4800 Spinal stenosis, site unspecified: Secondary | ICD-10-CM | POA: Insufficient documentation

## 2016-06-12 DIAGNOSIS — M5416 Radiculopathy, lumbar region: Secondary | ICD-10-CM

## 2016-06-12 DIAGNOSIS — M48062 Spinal stenosis, lumbar region with neurogenic claudication: Secondary | ICD-10-CM | POA: Diagnosis not present

## 2016-06-12 DIAGNOSIS — G9519 Other vascular myelopathies: Secondary | ICD-10-CM

## 2016-06-12 MED ORDER — PREDNISONE 20 MG PO TABS: ORAL_TABLET | ORAL | 0 refills | Status: DC

## 2016-06-12 NOTE — Progress Notes (Signed)
OFFICE VISIT NOTE [facility]. [facility], [facility]  Sarah Castro - 76 y.o. female MRN [id number]  Date of birth: 04/14/1940  Visit Date: 06/12/2016  PCP: Wenda Low, MD   Referred by: Wenda Low, MD  Jari Sportsman, cma acting as scribe for Dr. Paulla Fore.  SUBJECTIVE:   Chief Complaint  Patient presents with  . NP: RT back/hip pain   HPI: As below and per problem based documentation when appropriate.  [name] reports intermittent RT lower back/buttocks pain. Discomfort radiaties down leg to shin. Specific position trigger the pain. Received shot in the past with good results. Took Aleve with no relief.Pain is mainly located in the gluteal musculature without radiation to the lateral hip.  She has pain that radiates down the posterior aspect of her leg and into the right lateral calf.  Pain is worse with ambulation but improved while leaning on a shopping cart.  She has pain in both her back and her legs with ambulation.    Review of Systems  Constitutional: Negative.   HENT: Negative.   Eyes: Negative.   Respiratory: Negative.   Cardiovascular: Negative.   Gastrointestinal: Negative.   Genitourinary: Negative.   Musculoskeletal: Positive for joint pain.  Skin: Negative.   Neurological: Negative.   Endo/Heme/Allergies: Negative.   Psychiatric/Behavioral: Negative.     Otherwise per HPI.  HISTORY & PERTINENT PRIOR DATA:  No specialty comments available. She reports that she quit smoking about 5 years ago. Her smoking use included Cigarettes. She has a 10.00 pack-year smoking history. She has never used smokeless tobacco. No results for input(s): HGBA1C, LABURIC in the last 8760 hours. Medications & Allergies reviewed per EMR Patient Active Problem List   Diagnosis Date Noted  . Spinal stenosis 06/12/2016  . Neurogenic claudication 06/12/2016  . Lumbar radiculopathy 12/08/2015  . Degenerative  arthritis of left knee 10/27/2015  . Piriformis syndrome of right side 01/11/2015  . Greater trochanteric bursitis of right hip 11/17/2014  . Toenail fungus 08/04/2014  . Pes planus of both feet 08/22/2013  . Primary localized osteoarthrosis, lower leg 07/25/2013  . Expected blood loss anemia 03/19/2012  . S/P right TKA 03/18/2012   Past Medical History:  Diagnosis Date  . Arthritis   . Hypercholesteremia   . Hypertension   . PONV (postoperative nausea and vomiting)    Family History  Problem Relation Age of Onset  . Cancer Father     throat  . Cancer Sister     non hodgkins lymphoma  . Colon cancer Maternal Grandmother 75   Past Surgical History:  Procedure Laterality Date  . BUNIONECTOMY  2013  . CATARACT EXTRACTION     right  . [place]  2013  . HEMATOMA EVACUATION  5 years ago  . KNEE ARTHROSCOPY     right  . LAPAROSCOPIC APPENDECTOMY N/A 10/24/2012   Procedure: APPENDECTOMY LAPAROSCOPIC;  Surgeon: Rolm Bookbinder, MD;  Location: [facility];  Service: General;  Laterality: N/A;  . LAPAROSCOPIC APPENDECTOMY    . POSTERIOR TIBIAL TENDON REPAIR  1998  . TONSILLECTOMY   age 36  . 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   Procedure: TOTAL KNEE ARTHROPLASTY;  Surgeon: Mauri Pole, MD;  Location: [facility];  Service: Orthopedics;  Laterality: Right;   Social History   Occupational History  . Not on file.   Social History Main Topics  . Smoking status: Former Smoker    Packs/day: 0.50  Years: 20.00    Types: Cigarettes    Quit date: 02/07/2011  . Smokeless tobacco: Never Used  . Alcohol use 4.2 oz/week    7 Standard drinks or equivalent per week  . Drug use: No  . Sexual activity: Not on file    OBJECTIVE:  VS:  HT:5\' 5"  (165.1 cm)   WT:135 lb 6.4 oz (61.4 kg)  BMI:22.6    BP:(!) 170/100  HR:93bpm  TEMP: ( )  RESP:98 % EXAM: No additional findings.  WDWN, NAD, Non-toxic appearing Alert & appropriately interactive Not depressed or anxious  appearing No increased work of breathing. Pupils are equal. EOM intact without nystagmus No clubbing or cyanosis of the extremities appreciated No significant rashes/lesions/ulcerations overlying the examined area. Back & Lower Extremities:  No significant pretibial edema.  No LE clubbing or cyanosis.  DP & PT pulses 2+/4.  LE Sensation intact to light touch in LE dermatomes.  Bilateral tight hamstrings without radiating pain to the back with straight leg raise.  No significant midline tenderness.    TTP over right gluteal and greater sciatic notch on the right greater than left.  Only minimal pain along the greater trochanter.  No pain along the IT band.  Minimal pain within the popliteal fossa.  Good internal and external rotation of the hips.  Manual muscle testing is 5+/5 in BLE myotomes without focality  Left knee: Overall well aligned with osteoarthritic bossing. Small effusion. 3-4 mm opening with varus and valgus strain with solid endpoints.  No results found. ASSESSMENT & PLAN:  Visit Diagnoses:  1. Lumbar radiculopathy   2. Greater trochanteric bursitis of right hip   3. Neurogenic claudication   4. Primary osteoarthritis of left knee    Problem List Items Addressed This Visit    Greater trochanteric bursitis of right hip   Degenerative arthritis of left knee    Significant degenerative changes. Systemic therapy should be helpful but we also discussed continuous use of compression. Body Helix sleeve trialed.      Relevant Medications   predniSONE (DELTASONE) 20 MG tablet   Lumbar radiculopathy - Primary    Suspect an L5 right-sided radiculitis with possible S1 radiculitis on the left. General core conditioning discussed and reviewed with athletic training staff today.       Neurogenic claudication    Patient reports neurogenic claudication type symptoms that responded well to oral steroids.  Can consider empiric epidural steroid injection versus  oral/systemic therapy intermittently. Low-dose 14 day taper provided today.      Relevant Medications   predniSONE (DELTASONE) 20 MG tablet     Meds:  Meds ordered this encounter  Medications  . predniSONE (DELTASONE) 20 MG tablet    Sig: Take 3tabs PO QAM x3days, 2tabs PO QAM x3days, 1tab PO QAM x4days, 0.5tab PO QAM X4days    Dispense:  21 tablet    Refill:  0    Orders: No orders of the defined types were placed in this encounter.   Follow-up: Return if symptoms worsen or fail to improve.   CMA/ATC served as Education administrator during this visit. History, Physical, and Plan performed by medical provider. Documentation and orders reviewed and attested to.      Teresa Coombs, [facility] Physician    06/12/2016 5:18 PM

## 2016-06-12 NOTE — Patient Instructions (Signed)
You can use the compression sleeve at any time throughout the day but is most important to use while being active as well as for 2 hours post-activity.   It is appropriate to ice following activity with the compression sleeve in place.   

## 2016-06-12 NOTE — Assessment & Plan Note (Signed)
Patient reports neurogenic claudication type symptoms that responded well to oral steroids.  Can consider empiric epidural steroid injection versus oral/systemic therapy intermittently. Low-dose 14 day taper provided today.

## 2016-06-12 NOTE — Assessment & Plan Note (Signed)
Significant degenerative changes. Systemic therapy should be helpful but we also discussed continuous use of compression. Body Helix sleeve trialed.

## 2016-06-12 NOTE — Assessment & Plan Note (Signed)
Suspect an L5 right-sided radiculitis with possible S1 radiculitis on the left. General core conditioning discussed and reviewed with athletic training staff today.

## 2016-06-15 DIAGNOSIS — I10 Essential (primary) hypertension: Secondary | ICD-10-CM | POA: Diagnosis not present

## 2016-06-27 DIAGNOSIS — H353211 Exudative age-related macular degeneration, right eye, with active choroidal neovascularization: Secondary | ICD-10-CM | POA: Diagnosis not present

## 2016-06-27 DIAGNOSIS — H353221 Exudative age-related macular degeneration, left eye, with active choroidal neovascularization: Secondary | ICD-10-CM | POA: Diagnosis not present

## 2016-06-27 DIAGNOSIS — H35721 Serous detachment of retinal pigment epithelium, right eye: Secondary | ICD-10-CM | POA: Diagnosis not present

## 2016-06-27 DIAGNOSIS — H35051 Retinal neovascularization, unspecified, right eye: Secondary | ICD-10-CM | POA: Diagnosis not present

## 2016-06-28 DIAGNOSIS — H353211 Exudative age-related macular degeneration, right eye, with active choroidal neovascularization: Secondary | ICD-10-CM | POA: Diagnosis not present

## 2016-06-28 DIAGNOSIS — H35721 Serous detachment of retinal pigment epithelium, right eye: Secondary | ICD-10-CM | POA: Diagnosis not present

## 2016-06-28 DIAGNOSIS — H20051 Hypopyon, right eye: Secondary | ICD-10-CM | POA: Diagnosis not present

## 2016-06-28 DIAGNOSIS — H44111 Panuveitis, right eye: Secondary | ICD-10-CM | POA: Diagnosis not present

## 2016-06-28 DIAGNOSIS — H4051X3 Glaucoma secondary to other eye disorders, right eye, severe stage: Secondary | ICD-10-CM | POA: Diagnosis not present

## 2016-06-28 DIAGNOSIS — H44001 Unspecified purulent endophthalmitis, right eye: Secondary | ICD-10-CM | POA: Diagnosis not present

## 2016-06-29 DIAGNOSIS — H44012 Panophthalmitis (acute), left eye: Secondary | ICD-10-CM | POA: Diagnosis not present

## 2016-06-29 DIAGNOSIS — H44002 Unspecified purulent endophthalmitis, left eye: Secondary | ICD-10-CM | POA: Diagnosis not present

## 2016-06-29 DIAGNOSIS — H44111 Panuveitis, right eye: Secondary | ICD-10-CM | POA: Diagnosis not present

## 2016-06-29 DIAGNOSIS — H2142 Pupillary membranes, left eye: Secondary | ICD-10-CM | POA: Diagnosis not present

## 2016-06-29 DIAGNOSIS — H44001 Unspecified purulent endophthalmitis, right eye: Secondary | ICD-10-CM | POA: Diagnosis not present

## 2016-06-29 DIAGNOSIS — H44009 Unspecified purulent endophthalmitis, unspecified eye: Secondary | ICD-10-CM | POA: Diagnosis not present

## 2016-06-29 DIAGNOSIS — H43391 Other vitreous opacities, right eye: Secondary | ICD-10-CM | POA: Diagnosis not present

## 2016-06-29 DIAGNOSIS — H4321 Crystalline deposits in vitreous body, right eye: Secondary | ICD-10-CM | POA: Diagnosis not present

## 2016-06-29 DIAGNOSIS — H20051 Hypopyon, right eye: Secondary | ICD-10-CM | POA: Diagnosis not present

## 2016-06-29 DIAGNOSIS — H44011 Panophthalmitis (acute), right eye: Secondary | ICD-10-CM | POA: Diagnosis not present

## 2016-06-29 DIAGNOSIS — H21541 Posterior synechiae (iris), right eye: Secondary | ICD-10-CM | POA: Diagnosis not present

## 2016-07-04 DIAGNOSIS — H44001 Unspecified purulent endophthalmitis, right eye: Secondary | ICD-10-CM | POA: Diagnosis not present

## 2016-07-04 DIAGNOSIS — Z09 Encounter for follow-up examination after completed treatment for conditions other than malignant neoplasm: Secondary | ICD-10-CM | POA: Diagnosis not present

## 2016-07-04 DIAGNOSIS — H4321 Crystalline deposits in vitreous body, right eye: Secondary | ICD-10-CM | POA: Diagnosis not present

## 2016-07-04 DIAGNOSIS — H43391 Other vitreous opacities, right eye: Secondary | ICD-10-CM | POA: Diagnosis not present

## 2016-07-05 DIAGNOSIS — H43391 Other vitreous opacities, right eye: Secondary | ICD-10-CM | POA: Diagnosis not present

## 2016-07-05 DIAGNOSIS — H44002 Unspecified purulent endophthalmitis, left eye: Secondary | ICD-10-CM | POA: Diagnosis not present

## 2016-07-05 DIAGNOSIS — H4321 Crystalline deposits in vitreous body, right eye: Secondary | ICD-10-CM | POA: Diagnosis not present

## 2016-07-05 DIAGNOSIS — Z0189 Encounter for other specified special examinations: Secondary | ICD-10-CM | POA: Diagnosis not present

## 2016-07-05 DIAGNOSIS — H44011 Panophthalmitis (acute), right eye: Secondary | ICD-10-CM | POA: Diagnosis not present

## 2016-07-05 DIAGNOSIS — H44009 Unspecified purulent endophthalmitis, unspecified eye: Secondary | ICD-10-CM | POA: Diagnosis not present

## 2016-07-12 DIAGNOSIS — I10 Essential (primary) hypertension: Secondary | ICD-10-CM | POA: Diagnosis not present

## 2016-07-13 DIAGNOSIS — H43391 Other vitreous opacities, right eye: Secondary | ICD-10-CM | POA: Diagnosis not present

## 2016-07-13 DIAGNOSIS — Z09 Encounter for follow-up examination after completed treatment for conditions other than malignant neoplasm: Secondary | ICD-10-CM | POA: Diagnosis not present

## 2016-07-13 DIAGNOSIS — H44111 Panuveitis, right eye: Secondary | ICD-10-CM | POA: Diagnosis not present

## 2016-07-13 DIAGNOSIS — H44001 Unspecified purulent endophthalmitis, right eye: Secondary | ICD-10-CM | POA: Diagnosis not present

## 2016-07-16 DIAGNOSIS — W57XXXA Bitten or stung by nonvenomous insect and other nonvenomous arthropods, initial encounter: Secondary | ICD-10-CM | POA: Diagnosis not present

## 2016-07-16 DIAGNOSIS — L249 Irritant contact dermatitis, unspecified cause: Secondary | ICD-10-CM | POA: Diagnosis not present

## 2016-07-16 DIAGNOSIS — L279 Dermatitis due to unspecified substance taken internally: Secondary | ICD-10-CM | POA: Diagnosis not present

## 2016-07-17 DIAGNOSIS — R21 Rash and other nonspecific skin eruption: Secondary | ICD-10-CM | POA: Diagnosis not present

## 2016-07-20 DIAGNOSIS — H43391 Other vitreous opacities, right eye: Secondary | ICD-10-CM | POA: Diagnosis not present

## 2016-07-20 DIAGNOSIS — Z09 Encounter for follow-up examination after completed treatment for conditions other than malignant neoplasm: Secondary | ICD-10-CM | POA: Diagnosis not present

## 2016-07-20 DIAGNOSIS — H353231 Exudative age-related macular degeneration, bilateral, with active choroidal neovascularization: Secondary | ICD-10-CM | POA: Diagnosis not present

## 2016-08-25 DIAGNOSIS — H353222 Exudative age-related macular degeneration, left eye, with inactive choroidal neovascularization: Secondary | ICD-10-CM | POA: Diagnosis not present

## 2016-08-25 DIAGNOSIS — Z09 Encounter for follow-up examination after completed treatment for conditions other than malignant neoplasm: Secondary | ICD-10-CM | POA: Diagnosis not present

## 2016-08-25 DIAGNOSIS — H33051 Total retinal detachment, right eye: Secondary | ICD-10-CM | POA: Diagnosis not present

## 2016-09-07 DIAGNOSIS — H353231 Exudative age-related macular degeneration, bilateral, with active choroidal neovascularization: Secondary | ICD-10-CM | POA: Diagnosis not present

## 2016-09-07 DIAGNOSIS — H3341 Traction detachment of retina, right eye: Secondary | ICD-10-CM | POA: Diagnosis not present

## 2016-10-03 DIAGNOSIS — F411 Generalized anxiety disorder: Secondary | ICD-10-CM | POA: Diagnosis not present

## 2016-10-03 DIAGNOSIS — I10 Essential (primary) hypertension: Secondary | ICD-10-CM | POA: Diagnosis not present

## 2016-10-17 DIAGNOSIS — Z23 Encounter for immunization: Secondary | ICD-10-CM | POA: Diagnosis not present

## 2016-11-06 DIAGNOSIS — H21541 Posterior synechiae (iris), right eye: Secondary | ICD-10-CM | POA: Diagnosis not present

## 2016-11-06 DIAGNOSIS — H44001 Unspecified purulent endophthalmitis, right eye: Secondary | ICD-10-CM | POA: Diagnosis not present

## 2016-11-06 DIAGNOSIS — H353222 Exudative age-related macular degeneration, left eye, with inactive choroidal neovascularization: Secondary | ICD-10-CM | POA: Diagnosis not present

## 2016-11-06 DIAGNOSIS — H33051 Total retinal detachment, right eye: Secondary | ICD-10-CM | POA: Diagnosis not present

## 2016-11-08 DIAGNOSIS — Z0000 Encounter for general adult medical examination without abnormal findings: Secondary | ICD-10-CM | POA: Diagnosis not present

## 2016-11-08 DIAGNOSIS — Z23 Encounter for immunization: Secondary | ICD-10-CM | POA: Diagnosis not present

## 2016-11-08 DIAGNOSIS — E782 Mixed hyperlipidemia: Secondary | ICD-10-CM | POA: Diagnosis not present

## 2016-11-08 DIAGNOSIS — Z1389 Encounter for screening for other disorder: Secondary | ICD-10-CM | POA: Diagnosis not present

## 2016-11-08 DIAGNOSIS — R7303 Prediabetes: Secondary | ICD-10-CM | POA: Diagnosis not present

## 2016-11-08 DIAGNOSIS — M8580 Other specified disorders of bone density and structure, unspecified site: Secondary | ICD-10-CM | POA: Diagnosis not present

## 2016-11-08 DIAGNOSIS — K589 Irritable bowel syndrome without diarrhea: Secondary | ICD-10-CM | POA: Diagnosis not present

## 2016-11-08 DIAGNOSIS — M1990 Unspecified osteoarthritis, unspecified site: Secondary | ICD-10-CM | POA: Diagnosis not present

## 2016-11-08 DIAGNOSIS — I10 Essential (primary) hypertension: Secondary | ICD-10-CM | POA: Diagnosis not present

## 2016-11-08 DIAGNOSIS — F411 Generalized anxiety disorder: Secondary | ICD-10-CM | POA: Diagnosis not present

## 2016-12-12 DIAGNOSIS — H33051 Total retinal detachment, right eye: Secondary | ICD-10-CM | POA: Diagnosis not present

## 2016-12-12 DIAGNOSIS — H353123 Nonexudative age-related macular degeneration, left eye, advanced atrophic without subfoveal involvement: Secondary | ICD-10-CM | POA: Diagnosis not present

## 2016-12-12 DIAGNOSIS — H43391 Other vitreous opacities, right eye: Secondary | ICD-10-CM | POA: Diagnosis not present

## 2016-12-12 DIAGNOSIS — H353222 Exudative age-related macular degeneration, left eye, with inactive choroidal neovascularization: Secondary | ICD-10-CM | POA: Diagnosis not present

## 2017-01-11 DIAGNOSIS — H353211 Exudative age-related macular degeneration, right eye, with active choroidal neovascularization: Secondary | ICD-10-CM | POA: Diagnosis not present

## 2017-01-11 NOTE — Progress Notes (Signed)
[facility] [address], [place] [address] Phone: [phone] Subjective:     CC: Left knee, right hip pain  STM:[id number]  Sarah Castro is a 76 y.o. female coming in with complaint of left knee and right hip pain. Knee pain with sitting for long periods of time. Lateral knee pain that radiates up and down the leg. Hip pain runs down her leg. She says "it feels like she has shin splints. Knee pain bothers her at night. Has right knee replacement.   Onset-has been on and off for the last weeks Location- glut pain, lateral posterior knee pain Duration-  Character- Throbbing knee pain  Aggravating factors- sitting for long periods of time (knee), walking hurts the hip Reliving factors-  Therapies tried- Aleve, pennsaid Severity-7 out of 10     Past Medical History:  Diagnosis Date  . Arthritis   . Hypercholesteremia   . Hypertension   . PONV (postoperative nausea and vomiting)    Past Surgical History:  Procedure Laterality Date  . BUNIONECTOMY  2013  . CATARACT EXTRACTION     right  . [place]  2013  . HEMATOMA EVACUATION  5 years ago  . KNEE ARTHROSCOPY     right  . LAPAROSCOPIC APPENDECTOMY N/A 10/24/2012   Procedure: APPENDECTOMY LAPAROSCOPIC;  Surgeon: Rolm Bookbinder, MD;  Location: [facility];  Service: General;  Laterality: N/A;  . LAPAROSCOPIC APPENDECTOMY    . POSTERIOR TIBIAL TENDON REPAIR  1998  . TONSILLECTOMY   age 65  . 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 Marital status: Married    Spouse name: Not on file  . Number of children: Not on file  . Years of education: Not on file  . Highest education level: Not on file  Social Needs  . Financial resource strain: Not on file  . Food insecurity - worry: Not on file  . Food  insecurity - inability: Not on file  . Transportation needs - medical: Not on file  . Transportation needs - non-medical: Not on file  Occupational History  . Not on file  Tobacco Use  . Smoking status: Former Smoker    Packs/day: 0.50    Years: 20.00    Pack years: 10.00    Types: Cigarettes    Last attempt to quit: 02/07/2011    Years since quitting: 5.9  . Smokeless tobacco: Never Used  Substance and Sexual Activity  . Alcohol use: Yes    Alcohol/week: 4.2 oz    Types: 7 Standard drinks or equivalent per week  . Drug use: No  . Sexual activity: Not on file  Other Topics Concern  . Not on file  Social History Narrative  . Not on file   Allergies  Allergen Reactions  . Sulfa Antibiotics Rash   Family History  Problem Relation Age of Onset  . Cancer Father        throat  . Cancer Sister        non hodgkins lymphoma  . Colon cancer Maternal Grandmother 29     Past medical history, social, surgical and family history all reviewed in electronic medical record.  No pertanent information unless stated regarding to the chief complaint.   Review of Systems:Review of systems updated and as accurate as of  01/11/17  No headache, visual changes, nausea, vomiting, diarrhea, constipation, dizziness, abdominal pain, skin rash, fevers, chills, night sweats, weight loss, swollen lymph nodes, body aches, joint swelling, muscle aches, chest pain, shortness of breath, mood changes.   Objective  There were no vitals taken for this visit. Systems examined below as of 01/11/17   General: No apparent distress alert and oriented x3 mood and affect normal, dressed appropriately.  HEENT: Pupils equal, extraocular movements intact  Respiratory: Patient's speak in full sentences and does not appear short of breath  Cardiovascular: No lower extremity edema, non tender, no erythema  Skin: Warm dry intact with no signs of infection or rash on extremities or on axial skeleton.  Abdomen: Soft  nontender  Neuro: Cranial nerves II through XII are intact, neurovascularly intact in all extremities with 2+ DTRs and 2+ pulses.  Lymph: No lymphadenopathy of posterior or anterior cervical chain or axillae bilaterally.  Gait antalgic gait MSK: Mild to moderate tender with full range of motion and good stability and symmetric strength and tone of shoulders, elbows, wrist, , and ankles bilaterally.  Knee: Left valgus deformity noted.  Abnormal thigh to calf ratio patient also has effusion noted.  Tender to palpation over medial and PF joint line.  ROM full in flexion and extension and lower leg rotation. instability with valgus force.  painful patellar compression. Patellar glide with moderate crepitus. Patellar and quadriceps tendons unremarkable. Hamstring and quadriceps strength is normal. Contralateral knee shows replacement this seems to be stable  Right hip exam shows some pain over the greater trochanteric area.  Mild limitation in all planes.  Negative pain in the groin level.  Negative straight leg test.  Patient does have some mild atrophy of the legs bilaterally.  After informed written and verbal consent, patient was seated on exam table. Left knee was prepped with alcohol swab and utilizing anterolateral approach, patient's left knee space was injected with 4:1  marcaine 0.5%: Kenalog 40mg /dL. Patient tolerated the procedure well without immediate complications.   Impression and Recommendations:     This case required medical decision making of moderate complexity.      Note: This dictation was prepared with Dragon dictation along with smaller phrase technology. Any transcriptional errors that result from this process are unintentional.

## 2017-01-12 ENCOUNTER — Encounter: Payer: Self-pay | Admitting: Family Medicine

## 2017-01-12 ENCOUNTER — Ambulatory Visit (INDEPENDENT_AMBULATORY_CARE_PROVIDER_SITE_OTHER): Payer: Medicare Other | Admitting: Family Medicine

## 2017-01-12 ENCOUNTER — Ambulatory Visit: Payer: Self-pay

## 2017-01-12 VITALS — BP 160/80 | HR 105 | Ht 65.0 in | Wt 136.0 lb

## 2017-01-12 DIAGNOSIS — M1712 Unilateral primary osteoarthritis, left knee: Secondary | ICD-10-CM | POA: Diagnosis not present

## 2017-01-12 DIAGNOSIS — M25551 Pain in right hip: Secondary | ICD-10-CM

## 2017-01-12 DIAGNOSIS — M7061 Trochanteric bursitis, right hip: Secondary | ICD-10-CM | POA: Diagnosis not present

## 2017-01-12 NOTE — Assessment & Plan Note (Signed)
Recurrent pain.  Had an injection greater than 2 years ago.  Patient declines another one now.  We will continue to monitor.

## 2017-01-12 NOTE — Assessment & Plan Note (Signed)
Patient given injection today.  Did have a small aspiration done as well.  Stability of a custom brace which patient declined.  We discussed icing regimen, topical anti-inflammatories, which activities of doing which wants to avoid.  Patient is to increase activity slowly over the course the next several days.  Patient could be a good candidate for Visco supplementation.  Follow-up again in 4 weeks

## 2017-01-12 NOTE — Patient Instructions (Addendum)
Good to see you  Sarah Castro is your friend. Ice 20 minutes 2 times daily. Usually after activity and before bed. pennsaid pinkie amount topically 2 times daily as needed.  Exercises for the hip up to 2-3 times a day  Vitamin D 2000 Iu daily  Read about orthovisc in case the knee does not improve  See me again in 4-6 weeks Happy holidays!

## 2017-02-15 DIAGNOSIS — H353122 Nonexudative age-related macular degeneration, left eye, intermediate dry stage: Secondary | ICD-10-CM | POA: Diagnosis not present

## 2017-02-15 NOTE — Progress Notes (Signed)
[name] [address] [place], [place] [address] Phone: [phone] Subjective:      CC: Left knee pain follow-up  URK:[id number]  [name] is a 77 y.o. female coming in with complaint of knee pain follow-up.  Patient was seen previously and has osteoarthritic changes of the knee.  Last injection was January 12, 2017.  Patient was continue with home exercises, icing regimen, topical anti-inflammatories.  Patient states that the injection in her knee helped diminish her pain.   Is also having right hip pain and was found to have more of a greater trochanteric bursitis.  Concern for more of a lumbar radiculopathy.  On gabapentin 200 mg.  Patient states that she has been having pain in her right glute. Walking and standing for prolonged periods causes an increase in pain as well as being seated in the car.      Past Medical History:  Diagnosis Date  . Arthritis   . Hypercholesteremia   . Hypertension   . PONV (postoperative nausea and vomiting)    Past Surgical History:  Procedure Laterality Date  . BUNIONECTOMY  2013  . CATARACT EXTRACTION     right  . [place]  2013  . HEMATOMA EVACUATION  5 years ago  . KNEE ARTHROSCOPY     right  . LAPAROSCOPIC APPENDECTOMY N/A 10/24/2012   Procedure: APPENDECTOMY LAPAROSCOPIC;  Surgeon: Rolm Bookbinder, MD;  Location: [facility];  Service: General;  Laterality: N/A;  . LAPAROSCOPIC APPENDECTOMY    . POSTERIOR TIBIAL TENDON REPAIR  1998  . TONSILLECTOMY   age 35  . 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 Marital status: Married    Spouse name: None  . Number of children: None  . Years of education: None  . Highest education level: None  Social Needs  . Financial resource strain: None  . Food insecurity - worry:  None  . Food insecurity - inability: None  . Transportation needs - medical: None  . Transportation needs - non-medical: None  Occupational History  . None  Tobacco Use  . Smoking status: Former Smoker    Packs/day: 0.50    Years: 20.00    Pack years: 10.00    Types: Cigarettes    Last attempt to quit: 02/07/2011    Years since quitting: 6.0  . Smokeless tobacco: Never Used  Substance and Sexual Activity  . Alcohol use: Yes    Alcohol/week: 4.2 oz    Types: 7 Standard drinks or equivalent per week  . Drug use: No  . Sexual activity: None  Other Topics Concern  . None  Social History Narrative  . None   Allergies  Allergen Reactions  . Sulfa Antibiotics Rash   Family History  Problem Relation Age of Onset  . Cancer Father        throat  . Cancer Sister        non hodgkins lymphoma  . Colon cancer Maternal Grandmother 76     Past medical history, social, surgical and family history all reviewed in electronic medical record.  No pertanent information unless stated regarding to the chief complaint.   Review of Systems:Review of systems updated and as accurate as of 02/16/17  No headache,nausea, vomiting, diarrhea, constipation, dizziness, abdominal pain, skin rash, fevers, chills,  night sweats, weight loss, swollen lymph nodes, body aches, joint swelling,  chest pain, shortness of breath, mood changes.  Positive visual changes, muscle aches  Objective  Blood pressure 132/74, pulse 98, height 5\' 5"  (1.651 m), weight 139 lb (63 kg), SpO2 98 %. Systems examined below as of 02/16/17   General: No apparent distress alert and oriented x3 mood and affect normal, dressed appropriately.  HEENT: Pupils equal, extraocular movements intact  Respiratory: Patient's speak in full sentences and does not appear short of breath  Cardiovascular: No lower extremity edema, non tender, no erythema  Skin: Warm dry intact with no signs of infection or rash on extremities or on axial skeleton.    Abdomen: Soft nontender  Neuro: Cranial nerves II through XII are intact, neurovascularly intact in all extremities with 2+ DTRs and 2+ pulses.  Lymph: No lymphadenopathy of posterior or anterior cervical chain or axillae bilaterally.  Gait mild antalgic and carries a cane but able to walk without it MSK: Mild tender with mild limited range of motion and good stability and symmetric strength and tone of shoulders, elbows, wrist,  nd ankles bilaterally.  Patient does have some mild decreased range of motion of the right hip compared to the contralateral side.  Decreased internal range of motion bilaterally.  Severe tenderness in the right piriformis muscle.  Positive Corky Sox.  Significant tightness with straight leg but no radicular symptoms.  Neurovascularly intact distally.  Mild to moderate tenderness to palpation in the paraspinal musculature lumbar spine on the right side.  Mild limitation in all planes of the lumbar spine Left knee does show degenerative changes.  Decreased significant hypoechoic changes or any effusion.  Still has instability.  Procedure: Real-time Ultrasound Guided Injection of right piriformis muscle Device: GE Logiq Q7 Ultrasound guided injection is preferred based studies that show increased duration, increased effect, greater accuracy, decreased procedural pain, increased response rate, and decreased cost with ultrasound guided versus blind injection.  Verbal informed consent obtained.  Time-out conducted.  Noted no overlying erythema, induration, or other signs of local infection.  Skin prepped in a sterile fashion.  Local anesthesia: Topical Ethyl chloride.  With sterile technique and under real time ultrasound guidance: With a 21-gauge 2 inch needle patient was injected into the right piriformis with a total of 3 cc of 0.5% Marcaine and 1 cc of 40 mg/mL of Kenalog. Completed without difficulty  Pain immediately resolved suggesting accurate placement of the medication.   Advised to call if fevers/chills, erythema, induration, drainage, or persistent bleeding.  Images permanently stored and available for review in the ultrasound unit.  Impression: Technically successful ultrasound guided injection.      Impression and Recommendations:     This case required medical decision making of moderate complexity.      Note: This dictation was prepared with Dragon dictation along with smaller phrase technology. Any transcriptional errors that result from this process are unintentional.

## 2017-02-16 ENCOUNTER — Ambulatory Visit (INDEPENDENT_AMBULATORY_CARE_PROVIDER_SITE_OTHER): Payer: Medicare Other | Admitting: Family Medicine

## 2017-02-16 ENCOUNTER — Encounter: Payer: Self-pay | Admitting: Family Medicine

## 2017-02-16 ENCOUNTER — Ambulatory Visit: Payer: Self-pay

## 2017-02-16 VITALS — BP 132/74 | HR 98 | Ht 65.0 in | Wt 139.0 lb

## 2017-02-16 DIAGNOSIS — G5701 Lesion of sciatic nerve, right lower limb: Secondary | ICD-10-CM | POA: Diagnosis not present

## 2017-02-16 DIAGNOSIS — M25551 Pain in right hip: Secondary | ICD-10-CM

## 2017-02-16 DIAGNOSIS — M1712 Unilateral primary osteoarthritis, left knee: Secondary | ICD-10-CM | POA: Diagnosis not present

## 2017-02-16 MED ORDER — GABAPENTIN 100 MG PO CAPS
100.0000 mg | ORAL_CAPSULE | Freq: Every day | ORAL | 3 refills | Status: DC
Start: 2017-02-16 — End: 2017-04-02

## 2017-02-16 NOTE — Assessment & Plan Note (Signed)
Patient given repeat injection.  Tolerated the procedure well.  Has not had one for 3 years.  Hopefully this will be beneficial.  I do believe that there is still some lumbar radiculopathy that is contributing.  Patient will continue with all other conservative therapy including hip abductor strengthening.  Follow-up with me again in 4 weeks

## 2017-02-16 NOTE — Assessment & Plan Note (Signed)
Stable.  No intervention needed at this time.  Patient could be candidate for Visco supplementation if necessary.

## 2017-02-16 NOTE — Patient Instructions (Signed)
Injected the piriformis today  Gabapentin 100mg  at night Ice is your friend Enjoy skiing See me again in 6 weeks

## 2017-03-20 DIAGNOSIS — H43391 Other vitreous opacities, right eye: Secondary | ICD-10-CM | POA: Diagnosis not present

## 2017-03-20 DIAGNOSIS — H353222 Exudative age-related macular degeneration, left eye, with inactive choroidal neovascularization: Secondary | ICD-10-CM | POA: Diagnosis not present

## 2017-03-20 DIAGNOSIS — H33051 Total retinal detachment, right eye: Secondary | ICD-10-CM | POA: Diagnosis not present

## 2017-03-20 DIAGNOSIS — H353123 Nonexudative age-related macular degeneration, left eye, advanced atrophic without subfoveal involvement: Secondary | ICD-10-CM | POA: Diagnosis not present

## 2017-03-31 NOTE — Progress Notes (Signed)
[name] [place], [place] [address] Phone: [phone] Subjective:    I'm seeing this patient by the request  of:    CC: Left knee pain follow-up  OIZ:[id number]  Sarah Castro is a 77 y.o. female coming in with complaint of left knee pain.  Patient has been seen before for severe arthritis.  History of right total knee arthroplasty.  Patient was doing relatively well. Patient was also found to have more of a piriformis syndrome.  Given injection and started low-dose gabapentin at night.  Patient states was doing much better.  Had some intermittent ankle all the time.  Last minutes or when she has to change position and then seems to go away.  Concerned though because sometimes it feels like she is going to fall.  Sometimes associated with some weakness.      Past Medical History:  Diagnosis Date  . Arthritis   . Hypercholesteremia   . Hypertension   . PONV (postoperative nausea and vomiting)    Past Surgical History:  Procedure Laterality Date  . BUNIONECTOMY  2013  . CATARACT EXTRACTION     right  . [place]  2013  . HEMATOMA EVACUATION  5 years ago  . KNEE ARTHROSCOPY     right  . LAPAROSCOPIC APPENDECTOMY N/A 10/24/2012   Procedure: APPENDECTOMY LAPAROSCOPIC;  Surgeon: Rolm Bookbinder, MD;  Location: [facility];  Service: General;  Laterality: N/A;  . LAPAROSCOPIC APPENDECTOMY    . POSTERIOR TIBIAL TENDON REPAIR  1998  . TONSILLECTOMY   age 71  . 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 Marital status: Married    Spouse name: None  . Number of children: None  . Years of education: None  . Highest education level: None  Social Needs  . Financial resource strain: None  . Food insecurity - worry: None  . Food insecurity - inability: None    . Transportation needs - medical: None  . Transportation needs - non-medical: None  Occupational History  . None  Tobacco Use  . Smoking status: Former Smoker    Packs/day: 0.50    Years: 20.00    Pack years: 10.00    Types: Cigarettes    Last attempt to quit: 02/07/2011    Years since quitting: 6.1  . Smokeless tobacco: Never Used  Substance and Sexual Activity  . Alcohol use: Yes    Alcohol/week: 4.2 oz    Types: 7 Standard drinks or equivalent per week  . Drug use: No  . Sexual activity: None  Other Topics Concern  . None  Social History Narrative  . None   Allergies  Allergen Reactions  . Sulfa Antibiotics Rash   Family History  Problem Relation Age of Onset  . Cancer Father        throat  . Cancer Sister        non hodgkins lymphoma  . Colon cancer Maternal Grandmother 3     Past medical history, social, surgical and family history all reviewed in electronic medical record.  No pertanent information unless stated regarding to the chief complaint.   Review of Systems:Review of systems updated and as accurate as of 04/02/17  No headache, visual changes, nausea, vomiting, diarrhea, constipation, dizziness, abdominal pain, skin rash, fevers, chills, night  sweats, weight loss, swollen lymph nodes, body aches, joint swelling, muscle aches, chest pain, shortness of breath, mood changes.   Objective  Blood pressure (!) 158/84, pulse 84, height 5\' 5"  (1.651 m), weight 137 lb (62.1 kg), SpO2 97 %. Systems examined below as of 04/02/17   General: No apparent distress alert and oriented x3 mood and affect normal, dressed appropriately.  HEENT: Pupils equal, extraocular movements intact  Respiratory: Patient's speak in full sentences and does not appear short of breath  Cardiovascular: No lower extremity edema, non tender, no erythema  Skin: Warm dry intact with no signs of infection or rash on extremities or on axial skeleton.  Abdomen: Soft nontender  Neuro: Cranial  nerves II through XII are intact, neurovascularly intact in all extremities with 2+ DTRs and 2+ pulses.  Lymph: No lymphadenopathy of posterior or anterior cervical chain or axillae bilaterally.  Gait antalgic gait MSK:  Non tender with full range of motion and good stability and symmetric strength and tone of shoulders, elbows, wrist, hip, and ankles bilaterally.  Severe arthritic changes of multiple joints.  Right knee replacement noted. Left knee exam shows some degenerative valgus deformity.  Instability with valgus force.  Crepitus noted with decreasing range of motion lacking last 5 degrees of extension Back exam shows some degenerative scoliosis.  Some decreased range of motion in all planes. Patient does have a positive Corky Sox.  Mild positive straight leg test on the right side.      Impression and Recommendations:     This case required medical decision making of moderate complexity.      Note: This dictation was prepared with Dragon dictation along with smaller phrase technology. Any transcriptional errors that result from this process are unintentional.

## 2017-04-02 ENCOUNTER — Ambulatory Visit (INDEPENDENT_AMBULATORY_CARE_PROVIDER_SITE_OTHER): Payer: Medicare Other | Admitting: Family Medicine

## 2017-04-02 ENCOUNTER — Encounter: Payer: Self-pay | Admitting: Family Medicine

## 2017-04-02 DIAGNOSIS — M48062 Spinal stenosis, lumbar region with neurogenic claudication: Secondary | ICD-10-CM | POA: Diagnosis not present

## 2017-04-02 MED ORDER — GABAPENTIN 300 MG PO CAPS: ORAL_CAPSULE | ORAL | 3 refills | Status: DC

## 2017-04-02 NOTE — Patient Instructions (Signed)
Good to see you  Sarah Castro is your friend.  Gabapentin 300mg  at night  [email or web address] 1/8 inch on the left  See me again in 5 weeks just to see how you are doing.

## 2017-04-02 NOTE — Assessment & Plan Note (Signed)
Still noted, not as severe, making progress.  RTC in 3 weeks if worsening pain consider epidural as well.  Declined [facility].  Sarah Castro

## 2017-04-12 DIAGNOSIS — Z1231 Encounter for screening mammogram for malignant neoplasm of breast: Secondary | ICD-10-CM | POA: Diagnosis not present

## 2017-05-06 NOTE — Progress Notes (Signed)
[name] [place], [place] [address] Phone: [phone] Subjective:     CC: Right hip pain follow-up  [id number]  Sarah Castro is a 77 y.o. female coming in with complaint of right hip pain. She had to walk across some fields to watch her granddaughter play soccer. She had pain in the glute and hip when she got home. Pain has subsided since then and she has been able to perform ADLs at home today without pain.  Patient states that if she stops for some time it seems to go away and then it starts coming back again.  Seems to be having increasing spasms as well.  Patient would like to be able to walk greater than a half a mile without having pain.     Past Medical History:  Diagnosis Date  . Arthritis   . Hypercholesteremia   . Hypertension   . PONV (postoperative nausea and vomiting)    Past Surgical History:  Procedure Laterality Date  . BUNIONECTOMY  2013  . CATARACT EXTRACTION     right  . [place]  2013  . HEMATOMA EVACUATION  5 years ago  . KNEE ARTHROSCOPY     right  . LAPAROSCOPIC APPENDECTOMY N/A 10/24/2012   Procedure: APPENDECTOMY LAPAROSCOPIC;  Surgeon: Rolm Bookbinder, MD;  Location: [facility];  Service: General;  Laterality: N/A;  . LAPAROSCOPIC APPENDECTOMY    . POSTERIOR TIBIAL TENDON REPAIR  1998  . TONSILLECTOMY   age 69  . 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 Marital status: Married    Spouse name: Not on file  . Number of children: Not on file  . Years of education: Not on file  . Highest education level: Not on file  Occupational History  . Not on file  Social Needs  . Financial resource strain: Not on file  . Food insecurity:    Worry: Not on file    Inability: Not on file  . Transportation needs:    Medical:  Not on file    Non-medical: Not on file  Tobacco Use  . Smoking status: Former Smoker    Packs/day: 0.50    Years: 20.00    Pack years: 10.00    Types: Cigarettes    Last attempt to quit: 02/07/2011    Years since quitting: 6.2  . Smokeless tobacco: Never Used  Substance and Sexual Activity  . Alcohol use: Yes    Alcohol/week: 4.2 oz    Types: 7 Standard drinks or equivalent per week  . Drug use: No  . Sexual activity: Not on file  Lifestyle  . Physical activity:    Days per week: Not on file    Minutes per session: Not on file  . Stress: Not on file  Relationships  . Social connections:    Talks on phone: Not on file    Gets together: Not on file    Attends religious service: Not on file    Active member of club or organization: Not on file    Attends meetings of clubs or organizations: Not on file    Relationship status: Not on file  Other Topics Concern  . Not on file  Social History Narrative  . Not on file   Allergies  Allergen Reactions  .  Sulfa Antibiotics Rash   Family History  Problem Relation Age of Onset  . Cancer Father        throat  . Cancer Sister        non hodgkins lymphoma  . Colon cancer Maternal Grandmother 33     Past medical history, social, surgical and family history all reviewed in electronic medical record.  No pertanent information unless stated regarding to the chief complaint.   Review of Systems:Review of systems updated and as accurate as of 05/07/17  No headache, visual changes, nausea, vomiting, diarrhea, constipation, dizziness, abdominal pain, skin rash, fevers, chills, night sweats, weight loss, swollen lymph nodes, body aches, joint swelling,  chest pain, shortness of breath, mood changes.  Positive muscle aches  Objective  Blood pressure (!) 142/82, pulse 91, SpO2 98 %. Systems examined below as of 05/07/17   General: No apparent distress alert and oriented x3 mood and affect normal, dressed appropriately.  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  Abdomen: Soft nontender  Neuro: Cranial nerves II through XII are intact, neurovascularly intact in all extremities with 2+ DTRs and 2+ pulses.  Lymph: No lymphadenopathy of posterior or anterior cervical chain or axillae bilaterally.  Gait severely antalgic MSK:  tender with full range of motion and good stability and symmetric strength and tone of shoulders, elbows, wrist, , knee and ankles bilaterally.  Severe arthritic changes of multiple joints Right hip exam shows severe tenderness in the piriformis muscle again.  Positive Corky Sox.  Negative straight leg test.  Mild discomfort in the lumbar paraspinal musculature.  No pain in the groin with internal rotation.  Procedure: Real-time Ultrasound Guided Injection of right piriformis  Device: GE Logiq Q7 Ultrasound guided injection is preferred based studies that show increased duration, increased effect, greater accuracy, decreased procedural pain, increased response rate, and decreased cost with ultrasound guided versus blind injection.  Verbal informed consent obtained.  Time-out conducted.  Noted no overlying erythema, induration, or other signs of local infection.  Skin prepped in a sterile fashion.  Local anesthesia: Topical Ethyl chloride.  With sterile technique and under real time ultrasound guidance: With a 21-gauge 2 inch needle patient was injected with a total of 1 cc of 0.5% Marcaine and 1 cc of Kenalog 40mg /mL into the right piriformis tendon sheath. Completed without difficulty  Pain immediately resolved suggesting accurate placement of the medication.  Advised to call if fevers/chills, erythema, induration, drainage, or persistent  bleeding.  Images permanently stored and available for review in the ultrasound unit.  Impression: Technically successful ultrasound guided injection.      Impression and Recommendations:     This case required medical decision making of moderate complexity.      Note: This dictation was prepared with Dragon dictation along with smaller phrase technology. Any transcriptional errors that result from this process are unintentional.

## 2017-05-07 ENCOUNTER — Ambulatory Visit: Payer: Self-pay

## 2017-05-07 ENCOUNTER — Encounter: Payer: Self-pay | Admitting: Family Medicine

## 2017-05-07 ENCOUNTER — Ambulatory Visit (INDEPENDENT_AMBULATORY_CARE_PROVIDER_SITE_OTHER): Payer: Medicare Other | Admitting: Family Medicine

## 2017-05-07 VITALS — BP 142/82 | HR 91

## 2017-05-07 DIAGNOSIS — M25551 Pain in right hip: Secondary | ICD-10-CM

## 2017-05-07 DIAGNOSIS — G5701 Lesion of sciatic nerve, right lower limb: Secondary | ICD-10-CM | POA: Diagnosis not present

## 2017-05-07 DIAGNOSIS — M48062 Spinal stenosis, lumbar region with neurogenic claudication: Secondary | ICD-10-CM

## 2017-05-07 DIAGNOSIS — G9519 Other vascular myelopathies: Secondary | ICD-10-CM

## 2017-05-07 NOTE — Assessment & Plan Note (Signed)
Patient given injection.  Discussed with patient in great length about the possibility of this still be more of a lumbar radiculopathy as well as neurogenic claudication.  We discussed further workup including MRI and epidurals the back which patient has declined.  We discussed continuing gabapentin.  Patient declined formal physical therapy.  Follow-up with me again in 6-8 weeks

## 2017-05-07 NOTE — Patient Instructions (Signed)
Good to see you  You should do well  Injected the piriformis again  Continue the gabapentin  Tart cherry extract any dose at night Vitamin D 2000 I daily  See me when you need me

## 2017-05-10 DIAGNOSIS — M1990 Unspecified osteoarthritis, unspecified site: Secondary | ICD-10-CM | POA: Diagnosis not present

## 2017-05-10 DIAGNOSIS — R7303 Prediabetes: Secondary | ICD-10-CM | POA: Diagnosis not present

## 2017-05-10 DIAGNOSIS — I10 Essential (primary) hypertension: Secondary | ICD-10-CM | POA: Diagnosis not present

## 2017-05-10 DIAGNOSIS — E782 Mixed hyperlipidemia: Secondary | ICD-10-CM | POA: Diagnosis not present

## 2017-05-10 DIAGNOSIS — M8580 Other specified disorders of bone density and structure, unspecified site: Secondary | ICD-10-CM | POA: Diagnosis not present

## 2017-05-10 DIAGNOSIS — M5136 Other intervertebral disc degeneration, lumbar region: Secondary | ICD-10-CM | POA: Diagnosis not present

## 2017-05-14 DIAGNOSIS — M5136 Other intervertebral disc degeneration, lumbar region: Secondary | ICD-10-CM | POA: Diagnosis not present

## 2017-05-14 DIAGNOSIS — M1990 Unspecified osteoarthritis, unspecified site: Secondary | ICD-10-CM | POA: Diagnosis not present

## 2017-05-16 DIAGNOSIS — M5136 Other intervertebral disc degeneration, lumbar region: Secondary | ICD-10-CM | POA: Diagnosis not present

## 2017-05-16 DIAGNOSIS — M1990 Unspecified osteoarthritis, unspecified site: Secondary | ICD-10-CM | POA: Diagnosis not present

## 2017-05-17 DIAGNOSIS — M5136 Other intervertebral disc degeneration, lumbar region: Secondary | ICD-10-CM | POA: Diagnosis not present

## 2017-05-17 DIAGNOSIS — M1990 Unspecified osteoarthritis, unspecified site: Secondary | ICD-10-CM | POA: Diagnosis not present

## 2017-05-21 DIAGNOSIS — M5136 Other intervertebral disc degeneration, lumbar region: Secondary | ICD-10-CM | POA: Diagnosis not present

## 2017-05-21 DIAGNOSIS — M1990 Unspecified osteoarthritis, unspecified site: Secondary | ICD-10-CM | POA: Diagnosis not present

## 2017-05-23 DIAGNOSIS — M5136 Other intervertebral disc degeneration, lumbar region: Secondary | ICD-10-CM | POA: Diagnosis not present

## 2017-05-23 DIAGNOSIS — M1990 Unspecified osteoarthritis, unspecified site: Secondary | ICD-10-CM | POA: Diagnosis not present

## 2017-05-30 DIAGNOSIS — M5136 Other intervertebral disc degeneration, lumbar region: Secondary | ICD-10-CM | POA: Diagnosis not present

## 2017-05-30 DIAGNOSIS — M1990 Unspecified osteoarthritis, unspecified site: Secondary | ICD-10-CM | POA: Diagnosis not present

## 2017-06-01 DIAGNOSIS — M5136 Other intervertebral disc degeneration, lumbar region: Secondary | ICD-10-CM | POA: Diagnosis not present

## 2017-06-01 DIAGNOSIS — M1990 Unspecified osteoarthritis, unspecified site: Secondary | ICD-10-CM | POA: Diagnosis not present

## 2017-06-04 DIAGNOSIS — M5136 Other intervertebral disc degeneration, lumbar region: Secondary | ICD-10-CM | POA: Diagnosis not present

## 2017-06-04 DIAGNOSIS — M1990 Unspecified osteoarthritis, unspecified site: Secondary | ICD-10-CM | POA: Diagnosis not present

## 2017-06-06 DIAGNOSIS — M1990 Unspecified osteoarthritis, unspecified site: Secondary | ICD-10-CM | POA: Diagnosis not present

## 2017-06-06 DIAGNOSIS — M5136 Other intervertebral disc degeneration, lumbar region: Secondary | ICD-10-CM | POA: Diagnosis not present

## 2017-06-08 DIAGNOSIS — M5136 Other intervertebral disc degeneration, lumbar region: Secondary | ICD-10-CM | POA: Diagnosis not present

## 2017-06-08 DIAGNOSIS — M1990 Unspecified osteoarthritis, unspecified site: Secondary | ICD-10-CM | POA: Diagnosis not present

## 2017-06-11 DIAGNOSIS — M1990 Unspecified osteoarthritis, unspecified site: Secondary | ICD-10-CM | POA: Diagnosis not present

## 2017-06-11 DIAGNOSIS — M5136 Other intervertebral disc degeneration, lumbar region: Secondary | ICD-10-CM | POA: Diagnosis not present

## 2017-06-13 DIAGNOSIS — M5136 Other intervertebral disc degeneration, lumbar region: Secondary | ICD-10-CM | POA: Diagnosis not present

## 2017-06-13 DIAGNOSIS — M1990 Unspecified osteoarthritis, unspecified site: Secondary | ICD-10-CM | POA: Diagnosis not present

## 2017-06-15 DIAGNOSIS — M1990 Unspecified osteoarthritis, unspecified site: Secondary | ICD-10-CM | POA: Diagnosis not present

## 2017-06-15 DIAGNOSIS — M5136 Other intervertebral disc degeneration, lumbar region: Secondary | ICD-10-CM | POA: Diagnosis not present

## 2017-06-18 DIAGNOSIS — M1990 Unspecified osteoarthritis, unspecified site: Secondary | ICD-10-CM | POA: Diagnosis not present

## 2017-06-18 DIAGNOSIS — M5136 Other intervertebral disc degeneration, lumbar region: Secondary | ICD-10-CM | POA: Diagnosis not present

## 2017-06-19 DIAGNOSIS — H33051 Total retinal detachment, right eye: Secondary | ICD-10-CM | POA: Diagnosis not present

## 2017-06-19 DIAGNOSIS — H353222 Exudative age-related macular degeneration, left eye, with inactive choroidal neovascularization: Secondary | ICD-10-CM | POA: Diagnosis not present

## 2017-06-19 DIAGNOSIS — H353123 Nonexudative age-related macular degeneration, left eye, advanced atrophic without subfoveal involvement: Secondary | ICD-10-CM | POA: Diagnosis not present

## 2017-06-19 DIAGNOSIS — H43391 Other vitreous opacities, right eye: Secondary | ICD-10-CM | POA: Diagnosis not present

## 2017-06-20 DIAGNOSIS — M1990 Unspecified osteoarthritis, unspecified site: Secondary | ICD-10-CM | POA: Diagnosis not present

## 2017-06-20 DIAGNOSIS — M5136 Other intervertebral disc degeneration, lumbar region: Secondary | ICD-10-CM | POA: Diagnosis not present

## 2017-06-22 DIAGNOSIS — M1990 Unspecified osteoarthritis, unspecified site: Secondary | ICD-10-CM | POA: Diagnosis not present

## 2017-06-22 DIAGNOSIS — M5136 Other intervertebral disc degeneration, lumbar region: Secondary | ICD-10-CM | POA: Diagnosis not present

## 2017-06-25 DIAGNOSIS — M5136 Other intervertebral disc degeneration, lumbar region: Secondary | ICD-10-CM | POA: Diagnosis not present

## 2017-06-25 DIAGNOSIS — M1990 Unspecified osteoarthritis, unspecified site: Secondary | ICD-10-CM | POA: Diagnosis not present

## 2017-06-27 DIAGNOSIS — M5136 Other intervertebral disc degeneration, lumbar region: Secondary | ICD-10-CM | POA: Diagnosis not present

## 2017-06-27 DIAGNOSIS — M1990 Unspecified osteoarthritis, unspecified site: Secondary | ICD-10-CM | POA: Diagnosis not present

## 2017-07-10 DIAGNOSIS — J0140 Acute pansinusitis, unspecified: Secondary | ICD-10-CM | POA: Diagnosis not present

## 2017-07-10 DIAGNOSIS — R05 Cough: Secondary | ICD-10-CM | POA: Diagnosis not present

## 2017-07-16 DIAGNOSIS — M1990 Unspecified osteoarthritis, unspecified site: Secondary | ICD-10-CM | POA: Diagnosis not present

## 2017-07-16 DIAGNOSIS — M5136 Other intervertebral disc degeneration, lumbar region: Secondary | ICD-10-CM | POA: Diagnosis not present

## 2017-07-24 DIAGNOSIS — M5136 Other intervertebral disc degeneration, lumbar region: Secondary | ICD-10-CM | POA: Diagnosis not present

## 2017-07-24 DIAGNOSIS — M1990 Unspecified osteoarthritis, unspecified site: Secondary | ICD-10-CM | POA: Diagnosis not present

## 2017-07-31 NOTE — Progress Notes (Signed)
[name] [place], [place] [address] Phone: [phone] Subjective:      CC: Back pain  [id number]  Sarah Castro is a 77 y.o. female coming in with complaint of back and right hip pain.  Patient has had some uncomfortable aspect.  Patient states that the piriformis injection given previously did not help.  Is having some mild increasing right radiation of the pain.  Patient has some mild weakness.  Patient cannot walk long distances without so much pain..     Past Medical History:  Diagnosis Date  . Arthritis   . Hypercholesteremia   . Hypertension   . PONV (postoperative nausea and vomiting)    Past Surgical History:  Procedure Laterality Date  . BUNIONECTOMY  2013  . CATARACT EXTRACTION     right  . [place]  2013  . HEMATOMA EVACUATION  5 years ago  . KNEE ARTHROSCOPY     right  . LAPAROSCOPIC APPENDECTOMY N/A 10/24/2012   Procedure: APPENDECTOMY LAPAROSCOPIC;  Surgeon: Rolm Bookbinder, MD;  Location: [facility];  Service: General;  Laterality: N/A;  . LAPAROSCOPIC APPENDECTOMY    . POSTERIOR TIBIAL TENDON REPAIR  1998  . TONSILLECTOMY   age 14  . 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 Marital status: Married    Spouse name: Not on file  . Number of children: Not on file  . Years of education: Not on file  . Highest education level: Not on file  Occupational History  . Not on file  Social Needs  . Financial resource strain: Not on file  . Food insecurity:    Worry: Not on file    Inability: Not on file  . Transportation needs:    Medical: Not on file    Non-medical: Not on file  Tobacco Use  . Smoking status: Former Smoker    Packs/day: 0.50    Years: 20.00    Pack years: 10.00    Types: Cigarettes    Last attempt to  quit: 02/07/2011    Years since quitting: 6.4  . Smokeless tobacco: Never Used  Substance and Sexual Activity  . Alcohol use: Yes    Alcohol/week: 4.2 oz    Types: 7 Standard drinks or equivalent per week  . Drug use: No  . Sexual activity: Not on file  Lifestyle  . Physical activity:    Days per week: Not on file    Minutes per session: Not on file  . Stress: Not on file  Relationships  . Social connections:    Talks on phone: Not on file    Gets together: Not on file    Attends religious service: Not on file    Active member of club or organization: Not on file    Attends meetings of clubs or organizations: Not on file    Relationship status: Not on file  Other Topics Concern  . Not on file  Social History Narrative  . Not on file   Allergies  Allergen Reactions  . Sulfa Antibiotics Rash   Family History  Problem Relation Age of Onset  . Cancer Father        throat  . Cancer Sister        non hodgkins lymphoma  . Colon cancer Maternal  Grandmother 32     Past medical history, social, surgical and family history all reviewed in electronic medical record.  No pertanent information unless stated regarding to the chief complaint.   Review of Systems:Review of systems updated and as accurate as of 08/01/17  No headache, visual changes, nausea, vomiting, diarrhea, constipation, dizziness, abdominal pain, skin rash, fevers, chills, night sweats, weight loss, swollen lymph nodes, body aches, joint swelling,  chest pain, shortness of breath, mood changes.  Positive muscle aches  Objective  Blood pressure 140/78, pulse 99, height 5\' 5"  (1.651 m), SpO2 97 %. Systems examined below as of 08/01/17   General: No apparent distress alert and oriented x3 mood and affect normal, dressed appropriately.  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  Abdomen: Soft nontender  Neuro: Cranial nerves II through XII are intact, neurovascularly intact in all extremities with 2+ pulses.  Lymph: No lymphadenopathy of posterior or anterior cervical chain or axillae bilaterally.  Gait antalgic gait MSK:  tender with full range of motion and good stability and symmetric strength and tone of shoulders, elbows, wrist, hip, knee and ankles bilaterally.   Back exam shows some degenerative scoliosis of the patient has significant loss of lordosis.  Patient does have some weakness of the right leg with 4 out of 5 strength.  Deep tendon reflexes 1+ on the right side.  Patient has no significant extension of the back and does have some limited side bending and rotation to the right as well.  Positive Faber on the right.  Straight leg test is positive on the right as well.  Severe tenderness still in the piriformis and severe tightness of the hamstring.     Impression and Recommendations:     This case required medical decision making of moderate complexity.      Note: This dictation was prepared with Dragon dictation along with smaller phrase technology. Any transcriptional errors that result from this process are unintentional.

## 2017-08-01 ENCOUNTER — Encounter: Payer: Self-pay | Admitting: Family Medicine

## 2017-08-01 ENCOUNTER — Ambulatory Visit (INDEPENDENT_AMBULATORY_CARE_PROVIDER_SITE_OTHER): Payer: Medicare Other | Admitting: Family Medicine

## 2017-08-01 ENCOUNTER — Ambulatory Visit (INDEPENDENT_AMBULATORY_CARE_PROVIDER_SITE_OTHER)
Admission: RE | Admit: 2017-08-01 | Discharge: 2017-08-01 | Disposition: A | Discharge status: Home / Self Care | Payer: Medicare Other | Source: Ambulatory Visit | Attending: Family Medicine | Admitting: Family Medicine

## 2017-08-01 VITALS — BP 140/78 | HR 99 | Ht 65.0 in

## 2017-08-01 DIAGNOSIS — M47816 Spondylosis without myelopathy or radiculopathy, lumbar region: Secondary | ICD-10-CM | POA: Diagnosis not present

## 2017-08-01 DIAGNOSIS — M5416 Radiculopathy, lumbar region: Secondary | ICD-10-CM

## 2017-08-01 NOTE — Patient Instructions (Signed)
Good to see you  We will get xray today and they will call you on the MRI soon. I would call [phone] and get the MRI set up yourself.  Ice is your friend.  Once I have the MRI then we will schedule a epidural  I want you to see me again 4-5 weeks and make sure you are ready to go for your trip

## 2017-08-01 NOTE — Assessment & Plan Note (Signed)
Patient does have more of a right lumbar radiculopathy.  Patient's is having on the right side.  Did not respond to the piriformis syndrome.  Patient has fairly known spinal stenosis like symptoms with more of a neurogenic claudication but does seem to be unilateral.  I do believe that patient could be a candidate for possible epidural injection.  Patient is not on a blood thinner.  At this time because of the weakness in the decrease in deep tendon reflexes as well as the severe pain and failing all conservative therapy including formal physical therapy and the duration of this going on for multiple years I do feel that advanced imaging would be warranted.  Patient is to continue the same medications at this time.  Depending on findings we will discuss possible nerve root versus epidural injections.  Patient will follow-up with me again in 2 to 3 weeks after imaging to discuss further treatment options.

## 2017-08-02 ENCOUNTER — Other Ambulatory Visit: Payer: Self-pay | Admitting: *Deleted

## 2017-08-02 MED ORDER — DIAZEPAM 5 MG PO TABS: ORAL_TABLET | ORAL | 0 refills | Status: DC

## 2017-08-02 NOTE — Telephone Encounter (Signed)
Pt called request valium be sent into pharmacy for her to take before the MRI.

## 2017-08-13 ENCOUNTER — Ambulatory Visit
Admission: RE | Admit: 2017-08-13 | Discharge: 2017-08-13 | Disposition: A | Discharge status: Home / Self Care | Payer: Medicare Other | Source: Ambulatory Visit | Attending: Family Medicine | Admitting: Family Medicine

## 2017-08-13 DIAGNOSIS — M48061 Spinal stenosis, lumbar region without neurogenic claudication: Secondary | ICD-10-CM | POA: Diagnosis not present

## 2017-08-13 DIAGNOSIS — M5416 Radiculopathy, lumbar region: Secondary | ICD-10-CM

## 2017-08-15 ENCOUNTER — Other Ambulatory Visit: Payer: Self-pay

## 2017-08-15 DIAGNOSIS — M5416 Radiculopathy, lumbar region: Secondary | ICD-10-CM

## 2017-08-15 NOTE — Progress Notes (Unsigned)
g

## 2017-08-22 ENCOUNTER — Ambulatory Visit
Admission: RE | Admit: 2017-08-22 | Discharge: 2017-08-22 | Disposition: A | Discharge status: Home / Self Care | Payer: Medicare Other | Source: Ambulatory Visit | Attending: Family Medicine | Admitting: Family Medicine

## 2017-08-22 DIAGNOSIS — M4316 Spondylolisthesis, lumbar region: Secondary | ICD-10-CM | POA: Diagnosis not present

## 2017-08-22 DIAGNOSIS — M5416 Radiculopathy, lumbar region: Secondary | ICD-10-CM

## 2017-08-22 MED ORDER — IOPAMIDOL (ISOVUE-M 200) INJECTION 41%
1.0000 mL | Freq: Once | INTRAMUSCULAR | Status: AC
  Administered 2017-08-22: 1 mL via EPIDURAL

## 2017-08-22 MED ORDER — METHYLPREDNISOLONE ACETATE 40 MG/ML INJ SUSP (RADIOLOG
120.0000 mg | Freq: Once | INTRAMUSCULAR | Status: AC
  Administered 2017-08-22: 120 mg via EPIDURAL

## 2017-08-22 NOTE — Discharge Instructions (Signed)

## 2017-08-23 ENCOUNTER — Encounter: Payer: Self-pay | Admitting: Family Medicine

## 2017-08-23 ENCOUNTER — Ambulatory Visit (INDEPENDENT_AMBULATORY_CARE_PROVIDER_SITE_OTHER): Payer: Medicare Other | Admitting: Family Medicine

## 2017-08-23 DIAGNOSIS — G9519 Other vascular myelopathies: Secondary | ICD-10-CM

## 2017-08-23 DIAGNOSIS — M48062 Spinal stenosis, lumbar region with neurogenic claudication: Secondary | ICD-10-CM

## 2017-08-23 MED ORDER — PREDNISONE 20 MG PO TABS: 40.0000 mg | ORAL_TABLET | Freq: Every day | ORAL | 0 refills | Status: DC

## 2017-08-23 NOTE — Progress Notes (Signed)
[name] [place], [place] [address] Phone: [phone] Subjective:     CC: Back pain follow-up  LGX:[id number]  Sarah Castro is a 77 y.o. female coming in with complaint of back pain.  Was found to have severe degenerative arthritic changes of the lumbar spine with spinal stenosis.  Patient was sent for an epidural.  Had this done.  Feeling significantly better.  States that the back pain is only now localized with minimal radicular symptoms down the leg.  Feels like she can walk much longer distances and she is happy with this     Past Medical History:  Diagnosis Date  . Arthritis   . Hypercholesteremia   . Hypertension   . PONV (postoperative nausea and vomiting)    Past Surgical History:  Procedure Laterality Date  . BUNIONECTOMY  2013  . CATARACT EXTRACTION     right  . [place]  2013  . HEMATOMA EVACUATION  5 years ago  . KNEE ARTHROSCOPY     right  . LAPAROSCOPIC APPENDECTOMY N/A 10/24/2012   Procedure: APPENDECTOMY LAPAROSCOPIC;  Surgeon: Rolm Bookbinder, MD;  Location: [facility];  Service: General;  Laterality: N/A;  . LAPAROSCOPIC APPENDECTOMY    . POSTERIOR TIBIAL TENDON REPAIR  1998  . TONSILLECTOMY   age 82  . 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 Marital status: Married    Spouse name: Not on file  . Number of children: Not on file  . Years of education: Not on file  . Highest education level: Not on file  Occupational History  . Not on file  Social Needs  . Financial resource strain: Not on file  . Food insecurity:    Worry: Not on file    Inability: Not on file  . Transportation needs:    Medical: Not on file    Non-medical: Not on file  Tobacco Use  . Smoking status: Former Smoker    Packs/day: 0.50    Years:  20.00    Pack years: 10.00    Types: Cigarettes    Last attempt to quit: 02/07/2011    Years since quitting: 6.5  . Smokeless tobacco: Never Used  Substance and Sexual Activity  . Alcohol use: Yes    Alcohol/week: 4.2 oz    Types: 7 Standard drinks or equivalent per week  . Drug use: No  . Sexual activity: Not on file  Lifestyle  . Physical activity:    Days per week: Not on file    Minutes per session: Not on file  . Stress: Not on file  Relationships  . Social connections:    Talks on phone: Not on file    Gets together: Not on file    Attends religious service: Not on file    Active member of club or organization: Not on file    Attends meetings of clubs or organizations: Not on file    Relationship status: Not on file  Other Topics Concern  . Not on file  Social History Narrative  . Not on file   Allergies  Allergen Reactions  . Sulfa Antibiotics Rash   Family History  Problem Relation Age of Onset  . Cancer Father        throat  . Cancer Sister  non hodgkins lymphoma  . Colon cancer Maternal Grandmother 44     Past medical history, social, surgical and family history all reviewed in electronic medical record.  No pertanent information unless stated regarding to the chief complaint.   Review of Systems:Review of systems updated and as accurate as of 08/23/17  No headache, visual changes, nausea, vomiting, diarrhea, constipation, dizziness, abdominal pain, skin rash, fevers, chills, night sweats, weight loss, swollen lymph nodes, body aches, joint swelling, , chest pain, shortness of breath, mood changes.  Mild positive muscle aches  Objective  Blood pressure (!) 150/80, pulse 91, height 5\' 5"  (1.651 m), weight 136 lb (61.7 kg), SpO2 96 %. Systems examined below as of 08/23/17   General: No apparent distress alert and oriented x3 mood and affect normal, dressed appropriately.  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  Abdomen: Soft nontender  Neuro: Cranial nerves II through XII are intact, neurovascularly intact in all extremities with 2+ DTRs and 2+ pulses.  Lymph: No lymphadenopathy of posterior or anterior cervical chain or axillae bilaterally.  Gait abnormality of gait MSK: Severe arthritic changes of multiple joints with a right knee replacement noted. Patient back exam shows significant degenerative scoliosis with loss of lordosis.  Still some mild tenderness over the sacroiliac joints bilaterally.  Patient has a tightness with straight leg test but no longer positive radicular symptoms.  Patient strength is 4 out of 5 in the lower extremities.    Impression and Recommendations:     This case required medical decision making of moderate complexity.      Note: This dictation was prepared with Dragon dictation along with smaller phrase technology. Any transcriptional errors that result from this process are unintentional.

## 2017-08-23 NOTE — Patient Instructions (Signed)
Good to see you  So glad you are better Ice 20 minutes 2 times daily. Usually after activity and before bed.If you can find ice pennsaid pinkie amount topically 2 times daily as needed.  If worsening pain then consider the prednisone  Otherwise see me again if knee acts up  Have a great time!

## 2017-08-23 NOTE — Assessment & Plan Note (Signed)
Responded well to the epidural.  Discussed icing regimen and home exercise.  Discussed which activities to do which wants to avoid.  Patient is going to be going on a long trip internationally and will be doing a lot of walking.  Prednisone given for any type of breakthrough pain.  Discussed icing regimen and home exercise.  Follow-up again in 4 to 8 weeks

## 2017-08-24 ENCOUNTER — Other Ambulatory Visit: Payer: Medicare Other

## 2017-09-16 DIAGNOSIS — M25551 Pain in right hip: Secondary | ICD-10-CM | POA: Diagnosis not present

## 2017-09-17 DIAGNOSIS — M25551 Pain in right hip: Secondary | ICD-10-CM | POA: Diagnosis not present

## 2017-09-20 ENCOUNTER — Telehealth: Payer: Self-pay | Admitting: *Deleted

## 2017-09-20 MED ORDER — VITAMIN D (ERGOCALCIFEROL) 1.25 MG (50000 UNIT) PO CAPS: 50000.0000 [IU] | ORAL_CAPSULE | ORAL | 0 refills | Status: DC

## 2017-09-20 NOTE — Telephone Encounter (Signed)
Per dr Tamala Julian. Send in ergocalciferol to pharmacy.

## 2017-09-25 ENCOUNTER — Other Ambulatory Visit: Payer: Self-pay | Admitting: Internal Medicine

## 2017-09-25 DIAGNOSIS — N8301 Follicular cyst of right ovary: Secondary | ICD-10-CM

## 2017-09-25 DIAGNOSIS — N83201 Unspecified ovarian cyst, right side: Secondary | ICD-10-CM | POA: Diagnosis not present

## 2017-09-25 DIAGNOSIS — R600 Localized edema: Secondary | ICD-10-CM | POA: Diagnosis not present

## 2017-10-02 ENCOUNTER — Other Ambulatory Visit: Payer: Self-pay | Admitting: Internal Medicine

## 2017-10-02 ENCOUNTER — Ambulatory Visit
Admission: RE | Admit: 2017-10-02 | Discharge: 2017-10-02 | Disposition: A | Discharge status: Home / Self Care | Payer: Medicare Other | Source: Ambulatory Visit | Attending: Internal Medicine | Admitting: Internal Medicine

## 2017-10-02 DIAGNOSIS — N8301 Follicular cyst of right ovary: Secondary | ICD-10-CM

## 2017-10-02 DIAGNOSIS — N83291 Other ovarian cyst, right side: Secondary | ICD-10-CM | POA: Diagnosis not present

## 2017-10-04 DIAGNOSIS — N83201 Unspecified ovarian cyst, right side: Secondary | ICD-10-CM | POA: Diagnosis not present

## 2017-10-04 DIAGNOSIS — R600 Localized edema: Secondary | ICD-10-CM | POA: Diagnosis not present

## 2017-10-04 DIAGNOSIS — I10 Essential (primary) hypertension: Secondary | ICD-10-CM | POA: Diagnosis not present

## 2017-11-05 DIAGNOSIS — M25521 Pain in right elbow: Secondary | ICD-10-CM | POA: Diagnosis not present

## 2017-11-05 DIAGNOSIS — M25511 Pain in right shoulder: Secondary | ICD-10-CM | POA: Diagnosis not present

## 2017-11-06 DIAGNOSIS — S42401A Unspecified fracture of lower end of right humerus, initial encounter for closed fracture: Secondary | ICD-10-CM | POA: Diagnosis not present

## 2017-11-06 DIAGNOSIS — Z23 Encounter for immunization: Secondary | ICD-10-CM | POA: Diagnosis not present

## 2017-11-06 DIAGNOSIS — H1131 Conjunctival hemorrhage, right eye: Secondary | ICD-10-CM | POA: Diagnosis not present

## 2017-11-06 DIAGNOSIS — F341 Dysthymic disorder: Secondary | ICD-10-CM | POA: Diagnosis not present

## 2017-11-06 DIAGNOSIS — S0083XA Contusion of other part of head, initial encounter: Secondary | ICD-10-CM | POA: Diagnosis not present

## 2017-11-13 DIAGNOSIS — M25521 Pain in right elbow: Secondary | ICD-10-CM | POA: Diagnosis not present

## 2017-11-13 DIAGNOSIS — M25511 Pain in right shoulder: Secondary | ICD-10-CM | POA: Diagnosis not present

## 2017-11-15 DIAGNOSIS — E782 Mixed hyperlipidemia: Secondary | ICD-10-CM | POA: Diagnosis not present

## 2017-11-15 DIAGNOSIS — K589 Irritable bowel syndrome without diarrhea: Secondary | ICD-10-CM | POA: Diagnosis not present

## 2017-11-15 DIAGNOSIS — Z0000 Encounter for general adult medical examination without abnormal findings: Secondary | ICD-10-CM | POA: Diagnosis not present

## 2017-11-15 DIAGNOSIS — Z1389 Encounter for screening for other disorder: Secondary | ICD-10-CM | POA: Diagnosis not present

## 2017-11-15 DIAGNOSIS — I10 Essential (primary) hypertension: Secondary | ICD-10-CM | POA: Diagnosis not present

## 2017-11-15 DIAGNOSIS — F411 Generalized anxiety disorder: Secondary | ICD-10-CM | POA: Diagnosis not present

## 2017-11-15 DIAGNOSIS — R7303 Prediabetes: Secondary | ICD-10-CM | POA: Diagnosis not present

## 2017-11-15 DIAGNOSIS — Z23 Encounter for immunization: Secondary | ICD-10-CM | POA: Diagnosis not present

## 2017-11-15 DIAGNOSIS — M8580 Other specified disorders of bone density and structure, unspecified site: Secondary | ICD-10-CM | POA: Diagnosis not present

## 2017-11-29 DIAGNOSIS — S42414A Nondisplaced simple supracondylar fracture without intercondylar fracture of right humerus, initial encounter for closed fracture: Secondary | ICD-10-CM | POA: Diagnosis not present

## 2017-12-06 DIAGNOSIS — M25621 Stiffness of right elbow, not elsewhere classified: Secondary | ICD-10-CM | POA: Diagnosis not present

## 2017-12-06 DIAGNOSIS — S42414D Nondisplaced simple supracondylar fracture without intercondylar fracture of right humerus, subsequent encounter for fracture with routine healing: Secondary | ICD-10-CM | POA: Diagnosis not present

## 2017-12-06 DIAGNOSIS — M25521 Pain in right elbow: Secondary | ICD-10-CM | POA: Diagnosis not present

## 2017-12-06 DIAGNOSIS — M6281 Muscle weakness (generalized): Secondary | ICD-10-CM | POA: Diagnosis not present

## 2017-12-11 DIAGNOSIS — M25621 Stiffness of right elbow, not elsewhere classified: Secondary | ICD-10-CM | POA: Diagnosis not present

## 2017-12-11 DIAGNOSIS — S42414D Nondisplaced simple supracondylar fracture without intercondylar fracture of right humerus, subsequent encounter for fracture with routine healing: Secondary | ICD-10-CM | POA: Diagnosis not present

## 2017-12-11 DIAGNOSIS — M25521 Pain in right elbow: Secondary | ICD-10-CM | POA: Diagnosis not present

## 2017-12-11 DIAGNOSIS — M6281 Muscle weakness (generalized): Secondary | ICD-10-CM | POA: Diagnosis not present

## 2017-12-13 ENCOUNTER — Other Ambulatory Visit: Payer: Self-pay | Admitting: Family Medicine

## 2017-12-14 DIAGNOSIS — M25621 Stiffness of right elbow, not elsewhere classified: Secondary | ICD-10-CM | POA: Diagnosis not present

## 2017-12-14 DIAGNOSIS — M25521 Pain in right elbow: Secondary | ICD-10-CM | POA: Diagnosis not present

## 2017-12-14 DIAGNOSIS — S42414D Nondisplaced simple supracondylar fracture without intercondylar fracture of right humerus, subsequent encounter for fracture with routine healing: Secondary | ICD-10-CM | POA: Diagnosis not present

## 2017-12-14 DIAGNOSIS — M6281 Muscle weakness (generalized): Secondary | ICD-10-CM | POA: Diagnosis not present

## 2017-12-17 ENCOUNTER — Other Ambulatory Visit: Payer: Self-pay | Admitting: Orthopaedic Surgery

## 2017-12-17 DIAGNOSIS — M25521 Pain in right elbow: Secondary | ICD-10-CM

## 2017-12-17 DIAGNOSIS — S42401A Unspecified fracture of lower end of right humerus, initial encounter for closed fracture: Secondary | ICD-10-CM

## 2017-12-17 DIAGNOSIS — S42414D Nondisplaced simple supracondylar fracture without intercondylar fracture of right humerus, subsequent encounter for fracture with routine healing: Secondary | ICD-10-CM | POA: Diagnosis not present

## 2017-12-18 ENCOUNTER — Ambulatory Visit
Admission: RE | Admit: 2017-12-18 | Discharge: 2017-12-18 | Disposition: A | Discharge status: Home / Self Care | Payer: Medicare Other | Source: Ambulatory Visit | Attending: Orthopaedic Surgery | Admitting: Orthopaedic Surgery

## 2017-12-18 ENCOUNTER — Other Ambulatory Visit: Payer: Self-pay | Admitting: Orthopaedic Surgery

## 2017-12-18 ENCOUNTER — Other Ambulatory Visit: Payer: Medicare Other

## 2017-12-18 ENCOUNTER — Ambulatory Visit
Admission: RE | Admit: 2017-12-18 | Discharge: 2017-12-18 | Disposition: A | Discharge status: Home / Self Care | Payer: Self-pay | Source: Ambulatory Visit | Attending: Orthopaedic Surgery | Admitting: Orthopaedic Surgery

## 2017-12-18 DIAGNOSIS — M25521 Pain in right elbow: Secondary | ICD-10-CM

## 2017-12-18 DIAGNOSIS — R937 Abnormal findings on diagnostic imaging of other parts of musculoskeletal system: Secondary | ICD-10-CM | POA: Diagnosis not present

## 2017-12-18 DIAGNOSIS — S42401A Unspecified fracture of lower end of right humerus, initial encounter for closed fracture: Secondary | ICD-10-CM

## 2017-12-18 DIAGNOSIS — S42471A Displaced transcondylar fracture of right humerus, initial encounter for closed fracture: Secondary | ICD-10-CM | POA: Diagnosis not present

## 2018-01-09 NOTE — Progress Notes (Signed)
[facility] [address], [place] [address] Phone: [phone] Subjective:    I Sarah Castro am serving as a Education administrator for Dr. Hulan Saas.   CC: Back and knee pain  [id number]  Sarah Castro is a 77 y.o. female coming in with complaint of back  knee pain. Fell at the end of September and broke her right arm.Bone is displaced. Put in a soft cast twice. Elbow is not healing properly. Arm is swollen. Still able to do ADLs but arm is weak. Bone is displaced. Stopped PT. Finger tips are a little numb. Good ROM. Still has back pain.  Onset- last Saturday in September  Location- elbow, lateral epi Duration-  Character- sharp and pain radiates up and down the arm Aggravating factors- holding things  Reliving factors- soft cast  Therapies tried-  Severity-   Patient was really here for her left knee pain.  Severe arthritic changes.  Last injection was a year ago.  Starting to affect daily activities.  Walking with the aid of a cane secondary to discomfort and pain.  Past Medical History:  Diagnosis Date  . Arthritis   . Hypercholesteremia   . Hypertension   . PONV (postoperative nausea and vomiting)    Past Surgical History:  Procedure Laterality Date  . BUNIONECTOMY  2013  . CATARACT EXTRACTION     right  . [place]  2013  . HEMATOMA EVACUATION  5 years ago  . KNEE ARTHROSCOPY     right  . LAPAROSCOPIC APPENDECTOMY N/A 10/24/2012   Procedure: APPENDECTOMY LAPAROSCOPIC;  Surgeon: Rolm Bookbinder, MD;  Location: [facility];  Service: General;  Laterality: N/A;  . LAPAROSCOPIC APPENDECTOMY    . POSTERIOR TIBIAL TENDON REPAIR  1998  . TONSILLECTOMY   age 71  . 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 Marital status: Married    Spouse name: Not on file    . Number of children: Not on file  . Years of education: Not on file  . Highest education level: Not on file  Occupational History  . Not on file  Social Needs  . Financial resource strain: Not on file  . Food insecurity:    Worry: Not on file    Inability: Not on file  . Transportation needs:    Medical: Not on file    Non-medical: Not on file  Tobacco Use  . Smoking status: Former Smoker    Packs/day: 0.50    Years: 20.00    Pack years: 10.00    Types: Cigarettes    Last attempt to quit: 02/07/2011    Years since quitting: 6.9  . Smokeless tobacco: Never Used  Substance and Sexual Activity  . Alcohol use: Yes    Alcohol/week: 7.0 standard drinks    Types: 7 Standard drinks or equivalent per week  . Drug use: No  . Sexual activity: Not on file  Lifestyle  . Physical activity:    Days per week: Not on file    Minutes per session: Not on file  . Stress: Not on file  Relationships  . Social connections:    Talks on phone: Not on file    Gets together: Not on file    Attends religious service: Not on file    Active member  of club or organization: Not on file    Attends meetings of clubs or organizations: Not on file    Relationship status: Not on file  Other Topics Concern  . Not on file  Social History Narrative  . Not on file   Allergies  Allergen Reactions  . Sulfa Antibiotics Rash   Family History  Problem Relation Age of Onset  . Cancer Father        throat  . Cancer Sister        non hodgkins lymphoma  . Colon cancer Maternal Grandmother 72    Current Outpatient Medications (Endocrine & Metabolic):  .  predniSONE (DELTASONE) 20 MG tablet, Take 2 tablets (40 mg total) by mouth daily with breakfast. (Patient not taking: Reported on 01/10/2018)   Current Outpatient Medications (Cardiovascular):  .  amlodipine-atorvastatin (CADUET) 2.5-10 MG tablet, Take 1 tablet by mouth daily. .  hydrochlorothiazide (HYDRODIURIL) 25 MG tablet, Take 25 mg by mouth every  morning. .  lovastatin (MEVACOR) 40 MG tablet, Take 20 mg by mouth at bedtime. Marland Kitchen  olmesartan (BENICAR) 40 MG tablet, Take 40 mg by mouth daily. .  benazepril (LOTENSIN) 10 MG tablet, Take 10 mg by mouth daily.     Current Outpatient Medications (Analgesics):  .  aspirin 81 MG tablet, Take 81 mg by mouth daily.     Current Outpatient Medications (Other):  Marland Kitchen  Calcium Carbonate-Vitamin D (CALCIUM 600 + D PO), Take 1 tablet by mouth 2 (two) times daily. .  cholecalciferol (VITAMIN D) 400 UNITS TABS, Take 400-800 Units by mouth 2 (two) times daily. Pt takes 400 mg at night. 800 mg qam .  diazepam (VALIUM) 5 MG tablet, Take 1 tablet 30 minutes before procedure. .  escitalopram (LEXAPRO) 5 MG tablet, Take 5 mg by mouth daily. .  fish oil-omega-3 fatty acids 1000 MG capsule, Take 2 g by mouth 2 (two) times daily. .  Multiple Vitamins-Minerals (MULTIVITAMIN WITH MINERALS) tablet, Take 1 tablet by mouth daily. .  Multiple Vitamins-Minerals (OCUVITE PRESERVISION PO), Take 2 capsules by mouth daily. .  Red Yeast Rice Extract 600 MG CAPS, Take 600 mg by mouth 2 (two) times daily. .  TURMERIC PO, Take 500 mg by mouth 2 (two) times daily. .  Vitamin D, Ergocalciferol, (DRISDOL) 1.25 MG (50000 UT) CAPS capsule, TAKE 1 CAPSULE BY MOUTH EVERY 7 DAYS .  gabapentin (NEURONTIN) 300 MG capsule, nightly (Patient not taking: Reported on 01/10/2018)  Current Facility-Administered Medications (Other):  .  0.9 %  sodium chloride infusion    Past medical history, social, surgical and family history all reviewed in electronic medical record.  No pertanent information unless stated regarding to the chief complaint.   Review of Systems:  No headache, visual changes, nausea, vomiting, diarrhea, constipation, dizziness, abdominal pain, skin rash, fevers, chills, night sweats, weight loss, swollen lymph nodes,, chest pain, shortness of breath, mood changes.  Positive muscle aches  Objective  Blood pressure (!)  160/84, pulse 70, height 5\' 5"  (1.651 m), weight 136 lb (61.7 kg), SpO2 97 %.    General: No apparent distress alert and oriented x3 mood and affect normal, dressed appropriately.  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  Abdomen: Soft nontender  Neuro: Cranial nerves II through XII are intact, neurovascularly intact in all extremities with 2+ DTRs and 2+ pulses.  Lymph: No lymphadenopathy of posterior or anterior cervical chain or axillae bilaterally.  Gait severely antalgic MSK: Severe arthritic changes of multiple joints.  Patient has severe abnormality of the right elbow.   Knee: Left valgus deformity noted.  Abnormal thigh to calf ratio.  Tender to palpation over medial and PF joint line.  ROM lacks last 5 degrees of extension instability with valgus force.  painful patellar compression. Patellar glide with moderate crepitus. Patellar and quadriceps tendons unremarkable. Hamstring and quadriceps strength is normal. Contralateral knee shows replacement.  After informed written and verbal consent, patient was seated on exam table. Left knee was prepped with alcohol swab and utilizing anterolateral approach, patient's left knee space was injected with 4:1  marcaine 0.5%: Kenalog 40mg /dL. Patient tolerated the procedure well without immediate complications.     Impression and Recommendations:     This case required medical decision making of moderate complexity. The above documentation has been reviewed and is accurate and complete Lyndal Pulley, DO       Note: This dictation was prepared with Dragon dictation along with smaller phrase technology. Any transcriptional errors that result from this  process are unintentional.

## 2018-01-10 ENCOUNTER — Ambulatory Visit: Payer: Self-pay

## 2018-01-10 ENCOUNTER — Ambulatory Visit (INDEPENDENT_AMBULATORY_CARE_PROVIDER_SITE_OTHER): Payer: Medicare Other | Admitting: Family Medicine

## 2018-01-10 ENCOUNTER — Encounter: Payer: Self-pay | Admitting: Family Medicine

## 2018-01-10 VITALS — BP 160/84 | HR 70 | Ht 65.0 in | Wt 136.0 lb

## 2018-01-10 DIAGNOSIS — M25521 Pain in right elbow: Secondary | ICD-10-CM | POA: Diagnosis not present

## 2018-01-10 DIAGNOSIS — S42421A Displaced comminuted supracondylar fracture without intercondylar fracture of right humerus, initial encounter for closed fracture: Secondary | ICD-10-CM | POA: Diagnosis not present

## 2018-01-10 DIAGNOSIS — M1712 Unilateral primary osteoarthritis, left knee: Secondary | ICD-10-CM

## 2018-01-10 NOTE — Patient Instructions (Addendum)
Good to see you  Stay away from docs ;) Continue with Sarah Castro  Try to get feet bove heart to help with the swelling.  Injected the knee  See em again in 4 weeks if knee still hurts and can do other injections

## 2018-01-10 NOTE — Assessment & Plan Note (Signed)
Comminuted transcondylar distal humeral fracture with 3 mm of radial displacement and 12 mm of dorsal displacement. 27 mm distraction between the major fracture fragments of the medial and lateral condyles. Mild callus formation. No active dislocation. Large joint effusion. This is independently visualized by me Patient's most recent CT scan.  This is unfortunately quite severe.  At this moment patient is seeing a specialist who states that there is no surgical intervention.  Possible replacement may be necessary down the road.  Patient seems to be doing decent with daily activities.  No change in management from [place]

## 2018-01-10 NOTE — Assessment & Plan Note (Signed)
Injected today.  Tolerated procedure well.  Discussed icing regimen and home exercises.  Discussed which activities to do which wants to avoid.  Increase activity as tolerated.  Follow-up again in 4 to 8 weeks could be a candidate for Visco supplementation

## 2018-01-15 DIAGNOSIS — M25521 Pain in right elbow: Secondary | ICD-10-CM | POA: Diagnosis not present

## 2018-02-11 NOTE — Progress Notes (Signed)
[facility] [address], [place] [address] Phone: [phone] Subjective:   Sarah Castro, am serving as a scribe for Dr. Hulan Saas.  I'm seeing this patient by the request  of:    CC: Left knee follow-up  [id number]  Sarah Castro is a 78 y.o. female coming in with complaint of left knee pain. Had injection that did help with her pain but the pain is coming back. Pain increased on Christmas Day. Cannot sleep at night due to pain. Does feel clicking in the knee.   Patient also notes pain in the right glute that radiates down back of her leg. Has to sit down to alleviate her pain.  Patient likely does have spinal stenosis but has been doing relatively well and does not want any significant intervention  Elbow and arm pain have improved. Patient states that she can use that arm but it feels heavier.  Patient had a supracondylar fracture.  Patient has noticed significant increase in range of motion still has discomfort and weakness but is going to avoid any surgical intervention     Past Medical History:  Diagnosis Date  . Arthritis   . Hypercholesteremia   . Hypertension   . PONV (postoperative nausea and vomiting)    Past Surgical History:  Procedure Laterality Date  . BUNIONECTOMY  2013  . CATARACT EXTRACTION     right  . [place]  2013  . HEMATOMA EVACUATION  5 years ago  . KNEE ARTHROSCOPY     right  . LAPAROSCOPIC APPENDECTOMY N/A 10/24/2012   Procedure: APPENDECTOMY LAPAROSCOPIC;  Surgeon: Rolm Bookbinder, MD;  Location: [facility];  Service: General;  Laterality: N/A;  . LAPAROSCOPIC APPENDECTOMY    . POSTERIOR TIBIAL TENDON REPAIR  1998  . TONSILLECTOMY   age 68  . 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 Marital status: Married   Spouse name: Not on file  . Number of children: Not on file  . Years of education: Not on file  . Highest education level: Not on file  Occupational History  . Not on file  Social Needs  . Financial resource strain: Not on file  . Food insecurity:    Worry: Not on file    Inability: Not on file  . Transportation needs:    Medical: Not on file    Non-medical: Not on file  Tobacco Use  . Smoking status: Former Smoker    Packs/day: 0.50    Years: 20.00    Pack years: 10.00    Types: Cigarettes    Last attempt to quit: 02/07/2011    Years since quitting: 7.0  . Smokeless tobacco: Never Used  Substance and Sexual Activity  . Alcohol use: Yes    Alcohol/week: 7.0 standard drinks    Types: 7 Standard drinks or equivalent per week  . Drug use: Castro  . Sexual activity: Not on file  Lifestyle  . Physical activity:    Days per week: Not on file    Minutes per session: Not on file  . Stress: Not on file  Relationships  . Social connections:    Talks on phone: Not on file    Gets together: Not on file    Attends religious service: Not on file  Active member of club or organization: Not on file    Attends meetings of clubs or organizations: Not on file    Relationship status: Not on file  Other Topics Concern  . Not on file  Social History Narrative  . Not on file   Allergies  Allergen Reactions  . Sulfa Antibiotics Rash   Family History  Problem Relation Age of Onset  . Cancer Father        throat  . Cancer Sister        non hodgkins lymphoma  . Colon cancer Maternal Grandmother 64    Current Outpatient Medications (Endocrine & Metabolic):  .  predniSONE (DELTASONE) 20 MG tablet, Take 2 tablets (40 mg total) by mouth daily with breakfast.   Current Outpatient Medications (Cardiovascular):  .  amlodipine-atorvastatin (CADUET) 2.5-10 MG tablet, Take 1 tablet by mouth daily. .  hydrochlorothiazide (HYDRODIURIL) 25 MG tablet, Take 25 mg by mouth every morning. .   lovastatin (MEVACOR) 40 MG tablet, Take 20 mg by mouth at bedtime. Marland Kitchen  olmesartan (BENICAR) 40 MG tablet, Take 40 mg by mouth daily.         Current Outpatient Medications (Other):  Marland Kitchen  Calcium Carbonate-Vitamin D (CALCIUM 600 + D PO), Take 1 tablet by mouth 2 (two) times daily. .  cholecalciferol (VITAMIN D) 400 UNITS TABS, Take 400-800 Units by mouth 2 (two) times daily. Pt takes 400 mg at night. 800 mg qam .  diazepam (VALIUM) 5 MG tablet, Take 1 tablet 30 minutes before procedure. .  escitalopram (LEXAPRO) 5 MG tablet, Take 5 mg by mouth daily. .  fish oil-omega-3 fatty acids 1000 MG capsule, Take 2 g by mouth 2 (two) times daily. Marland Kitchen  gabapentin (NEURONTIN) 300 MG capsule, nightly .  Multiple Vitamins-Minerals (MULTIVITAMIN WITH MINERALS) tablet, Take 1 tablet by mouth daily. .  Multiple Vitamins-Minerals (OCUVITE PRESERVISION PO), Take 2 capsules by mouth daily. .  Red Yeast Rice Extract 600 MG CAPS, Take 600 mg by mouth 2 (two) times daily. .  TURMERIC PO, Take 500 mg by mouth 2 (two) times daily. .  Vitamin D, Ergocalciferol, (DRISDOL) 1.25 MG (50000 UT) CAPS capsule, TAKE 1 CAPSULE BY MOUTH EVERY 7 DAYS  Current Facility-Administered Medications (Other):  .  0.9 %  sodium chloride infusion    Past medical history, social, surgical and family history all reviewed in electronic medical record.  Castro pertanent information unless stated regarding to the chief complaint.   Review of Systems:  Castro headache, visual changes, nausea, vomiting, diarrhea, constipation, dizziness, abdominal pain, skin rash, fevers, chills, night sweats, weight loss, swollen lymph nodes, , chest pain, shortness of breath, mood changes.  Positive muscle aches, body aches  Objective  Blood pressure 138/78, pulse 86, height 5\' 5"  (1.651 m), weight 147 lb (66.7 kg), SpO2 98 %.   General: Castro apparent distress alert and oriented x3 mood and affect normal, dressed appropriately.  HEENT: Pupils significant glaucoma  of the right eye Gait is severely antalgic still noted.  Left knee has instability.  Significant arthritic changes.  Patient does have effusion noted as well.  Lacks last 5 degrees of extension in the last 10 degrees of flexion.  After informed written and verbal consent, patient was seated on exam table. Left knee was prepped with alcohol swab and utilizing anterolateral approach, patient's left knee space was injected with Monovisc. Patient tolerated the procedure well without immediate complications.    Impression and Recommendations:      The  above documentation has been reviewed and is accurate and complete Lyndal Pulley, DO       Note: This dictation was prepared with Dragon dictation along with smaller phrase technology. Any transcriptional errors that result from this process are unintentional.

## 2018-02-12 ENCOUNTER — Ambulatory Visit (INDEPENDENT_AMBULATORY_CARE_PROVIDER_SITE_OTHER): Payer: Medicare Other | Admitting: Family Medicine

## 2018-02-12 DIAGNOSIS — M1712 Unilateral primary osteoarthritis, left knee: Secondary | ICD-10-CM

## 2018-02-12 NOTE — Patient Instructions (Addendum)
Great to see you  Sarah Castro is your friend Stay active  Gave monovisc injection today in the knee and will take some time to work I am happy on the arm  Ordered new injection for your back and call [phone] to schedule See me again in 4-6 weeks to make sure you are doing allright Happy New Year!

## 2018-02-12 NOTE — Assessment & Plan Note (Addendum)
Known arthritic changes.  Repeat x-rays ordered today.  Failed conservative therapy including steroid injections, formal physical therapy, has had difficulty with bracing.  Patient states continued to have pain.  Viscosupplementation given today.  Continue this.  Patient wants to avoid surgical intervention if possible.  X-rays ordered today

## 2018-02-14 ENCOUNTER — Other Ambulatory Visit: Payer: Self-pay

## 2018-02-14 ENCOUNTER — Telehealth: Payer: Self-pay

## 2018-02-14 DIAGNOSIS — M5416 Radiculopathy, lumbar region: Secondary | ICD-10-CM

## 2018-02-14 NOTE — Telephone Encounter (Signed)
Copied from [place] [phone]. Topic: Referral - Status >> Feb 14, 2018  1:27 PM Ivar Drape wrote: Reason for CRM:  Patient was in to see Dr. Tamala Julian on Tues 02/12/2018 and was told an order was going to be put in for an injection in her back at [facility] but they have not received the order.  Please forward the order to them ASAP so her appt can be made.

## 2018-02-14 NOTE — Telephone Encounter (Signed)
Left message for patient letting her know that order has been sent and she can now call [phone] to schedule.

## 2018-02-22 ENCOUNTER — Ambulatory Visit
Admission: RE | Admit: 2018-02-22 | Discharge: 2018-02-22 | Disposition: A | Discharge status: Home / Self Care | Payer: Medicare Other | Source: Ambulatory Visit | Attending: Family Medicine | Admitting: Family Medicine

## 2018-02-22 DIAGNOSIS — M5416 Radiculopathy, lumbar region: Secondary | ICD-10-CM

## 2018-02-22 DIAGNOSIS — M47817 Spondylosis without myelopathy or radiculopathy, lumbosacral region: Secondary | ICD-10-CM | POA: Diagnosis not present

## 2018-02-22 MED ORDER — METHYLPREDNISOLONE ACETATE 40 MG/ML INJ SUSP (RADIOLOG
120.0000 mg | Freq: Once | INTRAMUSCULAR | Status: AC
  Administered 2018-02-22: 120 mg via EPIDURAL

## 2018-02-22 MED ORDER — IOPAMIDOL (ISOVUE-M 200) INJECTION 41%
1.0000 mL | Freq: Once | INTRAMUSCULAR | Status: AC
  Administered 2018-02-22: 1 mL via EPIDURAL

## 2018-02-22 NOTE — Discharge Instructions (Signed)

## 2018-02-28 DIAGNOSIS — R600 Localized edema: Secondary | ICD-10-CM | POA: Diagnosis not present

## 2018-02-28 DIAGNOSIS — E782 Mixed hyperlipidemia: Secondary | ICD-10-CM | POA: Diagnosis not present

## 2018-02-28 DIAGNOSIS — M1990 Unspecified osteoarthritis, unspecified site: Secondary | ICD-10-CM | POA: Diagnosis not present

## 2018-02-28 DIAGNOSIS — I10 Essential (primary) hypertension: Secondary | ICD-10-CM | POA: Diagnosis not present

## 2018-03-18 ENCOUNTER — Other Ambulatory Visit: Payer: Self-pay | Admitting: *Deleted

## 2018-03-18 MED ORDER — VITAMIN D (ERGOCALCIFEROL) 1.25 MG (50000 UNIT) PO CAPS: ORAL_CAPSULE | ORAL | 0 refills | Status: DC

## 2018-03-19 ENCOUNTER — Ambulatory Visit: Payer: Medicare Other | Admitting: Family Medicine

## 2018-03-24 NOTE — Progress Notes (Signed)
[name] [place], [place] [address] Phone: [phone] Subjective:    I Sarah Castro am serving as a Education administrator for Dr. Hulan Saas.   CC: Knee pain and back pain  DVV:[id number]    02/12/2018  Known arthritic changes.  Repeat x-rays ordered today.  Failed conservative therapy including steroid injections, formal physical therapy, has had difficulty with bracing.  Patient states continued to have pain.  Viscosupplementation given today.  Continue this.  Patient wants to avoid surgical intervention if possible.  X-rays ordered today.  Updated 03/25/2018  Sarah Castro is a 78 y.o. female coming in with complaint of knee pain. States that the knee is not doing well. Crunches with activity. Issues with flexion. Epidural was not successful.  Continues to have back pain.  Feels like she is still compensating more for the knee.  At this stage patient states that she is never without pain in the knee and feels like the instability is worsening.  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  Patient is wondering if it is time for replacement.   Patient did have Monovisc at last exam.  Did not notice any improvement.  Noticed more crunching  Patient was also set up for an epidural in February 22, 2018.  Epidural was L4-L5  Past Medical History:  Diagnosis Date  . Arthritis   . Hypercholesteremia   . Hypertension   . PONV (postoperative nausea and vomiting)    Past Surgical History:  Procedure Laterality Date  . BUNIONECTOMY  2013  . CATARACT EXTRACTION     right  . [place]  2013  . HEMATOMA EVACUATION  5 years ago  . KNEE ARTHROSCOPY     right  . LAPAROSCOPIC APPENDECTOMY N/A 10/24/2012   Procedure: APPENDECTOMY LAPAROSCOPIC;  Surgeon: Rolm Bookbinder, MD;  Location: [facility];  Service: General;  Laterality: N/A;  . LAPAROSCOPIC APPENDECTOMY    . POSTERIOR TIBIAL TENDON REPAIR  1998  . TONSILLECTOMY   age  46  . 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 Marital status: Married    Spouse name: Not on file  . Number of children: Not on file  . Years of education: Not on file  . Highest education level: Not on file  Occupational History  . Not on file  Social Needs  . Financial resource strain: Not on file  . Food insecurity:    Worry: Not on file    Inability: Not on file  . Transportation needs:    Medical: Not on file    Non-medical: Not on file  Tobacco Use  . Smoking status: Former Smoker    Packs/day: 0.50    Years: 20.00    Pack years: 10.00    Types: Cigarettes    Last attempt to quit: 02/07/2011    Years since quitting: 7.1  . Smokeless tobacco: Never Used  Substance and Sexual Activity  . Alcohol use: Yes    Alcohol/week: 7.0 standard drinks    Types: 7 Standard drinks or equivalent per week  . Drug use: No  . Sexual activity: Not on file  Lifestyle  . Physical activity:    Days per week: Not on file    Minutes per session: Not on file  .  Stress: Not on file  Relationships  . Social connections:    Talks on phone: Not on file    Gets together: Not on file    Attends religious service: Not on file    Active member of club or organization: Not on file    Attends meetings of clubs or organizations: Not on file    Relationship status: Not on file  Other Topics Concern  . Not on file  Social History Narrative  . Not on file   Allergies  Allergen Reactions  . Sulfa Antibiotics Rash   Family History  Problem Relation Age of Onset  . Cancer Father        throat  . Cancer Sister        non hodgkins lymphoma  . Colon cancer Maternal Grandmother 39    Current Outpatient Medications (Endocrine & Metabolic):  .  predniSONE (DELTASONE) 20 MG tablet, Take 2 tablets (40 mg total) by mouth  daily with breakfast.   Current Outpatient Medications (Cardiovascular):  .  amlodipine-atorvastatin (CADUET) 2.5-10 MG tablet, Take 1 tablet by mouth daily. .  hydrochlorothiazide (HYDRODIURIL) 25 MG tablet, Take 25 mg by mouth every morning. .  lovastatin (MEVACOR) 40 MG tablet, Take 20 mg by mouth at bedtime. Marland Kitchen  olmesartan (BENICAR) 40 MG tablet, Take 40 mg by mouth daily.         Current Outpatient Medications (Other):  Marland Kitchen  Calcium Carbonate-Vitamin D (CALCIUM 600 + D PO), Take 1 tablet by mouth 2 (two) times daily. .  cholecalciferol (VITAMIN D) 400 UNITS TABS, Take 400-800 Units by mouth 2 (two) times daily. Pt takes 400 mg at night. 800 mg qam .  diazepam (VALIUM) 5 MG tablet, Take 1 tablet 30 minutes before procedure. .  escitalopram (LEXAPRO) 5 MG tablet, Take 5 mg by mouth daily. .  fish oil-omega-3 fatty acids 1000 MG capsule, Take 2 g by mouth 2 (two) times daily. Marland Kitchen  gabapentin (NEURONTIN) 300 MG capsule, nightly .  Multiple Vitamins-Minerals (MULTIVITAMIN WITH MINERALS) tablet, Take 1 tablet by mouth daily. .  Multiple Vitamins-Minerals (OCUVITE PRESERVISION PO), Take 2 capsules by mouth daily. .  Red Yeast Rice Extract 600 MG CAPS, Take 600 mg by mouth 2 (two) times daily. .  TURMERIC PO, Take 500 mg by mouth 2 (two) times daily. .  Vitamin D, Ergocalciferol, (DRISDOL) 1.25 MG (50000 UT) CAPS capsule, TAKE 1 CAPSULE BY MOUTH EVERY 7 DAYS  Current Facility-Administered Medications (Other):  .  0.9 %  sodium chloride infusion    Past medical history, social, surgical and family history all reviewed in electronic medical record.  No pertanent information unless stated regarding to the chief complaint.   Review of Systems:  No headache, , nausea, vomiting, diarrhea, constipation, dizziness, abdominal pain, skin rash, fevers, chills, night sweats, weight loss, swollen lymph nodes, chest pain, shortness of breath, mood changes.  Positive muscle aches, visual changes, body  aches  Objective  Blood pressure 110/70, pulse 82, height 5\' 5"  (1.651 m), weight 142 lb (64.4 kg), SpO2 96 %.    General: No apparent distress alert and oriented x3 mood and affect normal, dressed appropriately.  HEENT: Pupils equal, extraocular movements intact  Respiratory: Patient's speak in full sentences and does not appear short of breath  Cardiovascular: No lower extremity edema, non tender, no erythema  Skin: Warm dry intact with no signs of infection or rash on extremities or on axial skeleton.  Abdomen: Soft nontender  Neuro: Cranial nerves II  through XII are intact, neurovascularly intact in all extremities with 2+ DTRs and 2+ pulses.  Lymph: No lymphadenopathy of posterior or anterior cervical chain or axillae bilaterally.  Gait n severely antalgic MSK:   Knee: Left valgus deformity noted. Large thigh to calf ratio.  Tender to palpation over medial and PF joint line.  ROM full in flexion and extension and lower leg rotation. instability with valgus force.  painful patellar compression. Patellar glide with moderate crepitus. Patellar and quadriceps tendons unremarkable. Hamstring and quadriceps strength is normal. Contralateral knee shows placement noted in good repair       Impression and Recommendations:      The above documentation has been reviewed and is accurate and complete Lyndal Pulley, DO       Note: This dictation was prepared with Dragon dictation along with smaller phrase technology. Any transcriptional errors that result from this process are unintentional.

## 2018-03-25 ENCOUNTER — Ambulatory Visit (INDEPENDENT_AMBULATORY_CARE_PROVIDER_SITE_OTHER): Payer: Medicare Other | Admitting: Family Medicine

## 2018-03-25 ENCOUNTER — Encounter: Payer: Self-pay | Admitting: Family Medicine

## 2018-03-25 DIAGNOSIS — M1712 Unilateral primary osteoarthritis, left knee: Secondary | ICD-10-CM

## 2018-03-25 NOTE — Assessment & Plan Note (Addendum)
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  I do believe it is causing increasing instability that could be contributing to some of the back pain.  Patient did not respond well to the epidural.  We discussed the possibility of a piriformis injection with patient declined.  Patient though unfortunately with the knee is considering knee replacement.  We discussed that she is a high risk surgical patient and would need clearance but patient at this point feels that her quality of life has decreased tremendously at the moment.  Patient feels that unfortunately having more more discomfort and swelling of the knee and is unable to do things with family.  Patient will consider the possibility of replacement and will call if she wants referral to any of the physicians in our record Spent  25 minutes with patient face-to-face and had greater than 50% of counseling including as described above in assessment and plan.

## 2018-03-25 NOTE — Patient Instructions (Signed)
Good to see you  Ice is your friend Read about Alusio or Dr. Rhona Raider and I will refer when you are ready  Otherwise I have other injections.  See me again when you need me

## 2018-06-12 ENCOUNTER — Encounter: Payer: Self-pay | Admitting: Family Medicine

## 2018-06-12 ENCOUNTER — Ambulatory Visit (INDEPENDENT_AMBULATORY_CARE_PROVIDER_SITE_OTHER): Payer: Medicare Other | Admitting: Family Medicine

## 2018-06-12 ENCOUNTER — Ambulatory Visit: Payer: Self-pay

## 2018-06-12 ENCOUNTER — Other Ambulatory Visit: Payer: Self-pay

## 2018-06-12 VITALS — BP 138/80 | HR 77 | Ht 65.0 in | Wt 147.0 lb

## 2018-06-12 DIAGNOSIS — M1712 Unilateral primary osteoarthritis, left knee: Secondary | ICD-10-CM

## 2018-06-12 DIAGNOSIS — G8929 Other chronic pain: Secondary | ICD-10-CM

## 2018-06-12 DIAGNOSIS — M48062 Spinal stenosis, lumbar region with neurogenic claudication: Secondary | ICD-10-CM | POA: Diagnosis not present

## 2018-06-12 DIAGNOSIS — M25511 Pain in right shoulder: Secondary | ICD-10-CM | POA: Diagnosis not present

## 2018-06-12 DIAGNOSIS — M25512 Pain in left shoulder: Secondary | ICD-10-CM | POA: Diagnosis not present

## 2018-06-12 MED ORDER — DULOXETINE HCL 20 MG PO CPEP: 20.0000 mg | ORAL_CAPSULE | Freq: Every day | ORAL | 3 refills | Status: DC

## 2018-06-12 MED ORDER — TRIAMCINOLONE ACETONIDE 0.5 % EX CREA: 1.0000 "application " | TOPICAL_CREAM | Freq: Two times a day (BID) | CUTANEOUS | 3 refills | Status: DC

## 2018-06-12 NOTE — Progress Notes (Signed)
[name] [place], [place] [address] Phone: [phone] Subjective:   Sarah Castro, am serving as a scribe for Dr. Hulan Saas.  I'm seeing this patient by the request  of:    CC: Left knee pain follow-up and pain all over  FBP:[id number]  Sarah Castro is a 78 y.o. female coming in with complaint of left knee pain. States that her pain is constant. Cannot sleep at night. Does feel relief with walking or lying on her back.  Patient states that it continues to work with the aid of a cane.  She does this secondary to symptoms the knee feeling unstable as well as the patient having difficulty with her eyesight.  Any new injury.  Since her previous injection.  Wants to avoid increasing medications.     Past Medical History:  Diagnosis Date  . Arthritis   . Hypercholesteremia   . Hypertension   . PONV (postoperative nausea and vomiting)    Past Surgical History:  Procedure Laterality Date  . BUNIONECTOMY  2013  . CATARACT EXTRACTION     right  . [place]  2013  . HEMATOMA EVACUATION  5 years ago  . KNEE ARTHROSCOPY     right  . LAPAROSCOPIC APPENDECTOMY N/A 10/24/2012   Procedure: APPENDECTOMY LAPAROSCOPIC;  Surgeon: Rolm Bookbinder, MD;  Location: [facility];  Service: General;  Laterality: N/A;  . LAPAROSCOPIC APPENDECTOMY    . POSTERIOR TIBIAL TENDON REPAIR  1998  . TONSILLECTOMY   age 82  . 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 Marital status: Married    Spouse name: Not on file  . Number of children: Not on file  . Years of education: Not on file  . Highest education level: Not on file  Occupational History  . Not on file  Social Needs  . Financial resource strain: Not on file  . Food insecurity:    Worry: Not on file   Inability: Not on file  . Transportation needs:    Medical: Not on file    Non-medical: Not on file  Tobacco Use  . Smoking status: Former Smoker    Packs/day: 0.50    Years: 20.00    Pack years: 10.00    Types: Cigarettes    Last attempt to quit: 02/07/2011    Years since quitting: 7.3  . Smokeless tobacco: Never Used  Substance and Sexual Activity  . Alcohol use: Yes    Alcohol/week: 7.0 standard drinks    Types: 7 Standard drinks or equivalent per week  . Drug use: Castro  . Sexual activity: Not on file  Lifestyle  . Physical activity:    Days per week: Not on file    Minutes per session: Not on file  . Stress: Not on file  Relationships  . Social connections:    Talks on phone: Not on file    Gets together: Not on file    Attends religious service: Not on file    Active member of club or organization: Not on file    Attends meetings of clubs or organizations: Not on file    Relationship status: Not on file  Other Topics Concern  . Not on file  Social History Narrative  . Not on file   Allergies  Allergen Reactions  . Sulfa Antibiotics Rash   Family History  Problem Relation Age of Onset  . Cancer Father        throat  . Cancer Sister        non hodgkins lymphoma  . Colon cancer Maternal Grandmother 17    Current Outpatient Medications (Endocrine & Metabolic):  .  predniSONE (DELTASONE) 20 MG tablet, Take 2 tablets (40 mg total) by mouth daily with breakfast.   Current Outpatient Medications (Cardiovascular):  .  amlodipine-atorvastatin (CADUET) 2.5-10 MG tablet, Take 1 tablet by mouth daily. .  hydrochlorothiazide (HYDRODIURIL) 25 MG tablet, Take 25 mg by mouth every morning. .  lovastatin (MEVACOR) 40 MG tablet, Take 20 mg by mouth at bedtime. Marland Kitchen  olmesartan (BENICAR) 40 MG tablet, Take 40 mg by mouth daily.         Current Outpatient Medications (Other):  Marland Kitchen  Calcium Carbonate-Vitamin D (CALCIUM 600 + D PO), Take 1 tablet by mouth 2 (two) times daily.  .  cholecalciferol (VITAMIN D) 400 UNITS TABS, Take 400-800 Units by mouth 2 (two) times daily. Pt takes 400 mg at night. 800 mg qam .  diazepam (VALIUM) 5 MG tablet, Take 1 tablet 30 minutes before procedure. .  fish oil-omega-3 fatty acids 1000 MG capsule, Take 2 g by mouth 2 (two) times daily. Marland Kitchen  gabapentin (NEURONTIN) 300 MG capsule, nightly .  Multiple Vitamins-Minerals (MULTIVITAMIN WITH MINERALS) tablet, Take 1 tablet by mouth daily. .  Multiple Vitamins-Minerals (OCUVITE PRESERVISION PO), Take 2 capsules by mouth daily. .  Red Yeast Rice Extract 600 MG CAPS, Take 600 mg by mouth 2 (two) times daily. .  TURMERIC PO, Take 500 mg by mouth 2 (two) times daily. .  Vitamin D, Ergocalciferol, (DRISDOL) 1.25 MG (50000 UT) CAPS capsule, TAKE 1 CAPSULE BY MOUTH EVERY 7 DAYS .  DULoxetine (CYMBALTA) 20 MG capsule, Take 1 capsule (20 mg total) by mouth daily. Marland Kitchen  triamcinolone cream (KENALOG) 0.5 %, Apply 1 application topically 2 (two) times daily. To affected areas.  Current Facility-Administered Medications (Other):  .  0.9 %  sodium chloride infusion    Past medical history, social, surgical and family history all reviewed in electronic medical record.  Castro pertanent information unless stated regarding to the chief complaint.   Review of Systems:  Castro headache, visual changes, nausea, vomiting, diarrhea, constipation, dizziness, abdominal pain, skin rash, fevers, chills, night sweats, weight loss, swollen lymph nodes, body aches, joint swelling, chest pain, shortness of breath, mood changes.  Positive muscle aches  Objective  Blood pressure 138/80, pulse 77, height 5\' 5"  (1.651 m), weight 147 lb (66.7 kg), SpO2 97 %.    General: Castro apparent distress alert and oriented x3 mood and affect normal, dressed appropriately.  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  Abdomen: Soft nontender  Neuro: Cranial nerves II through XII are intact, neurovascularly intact in all extremities with 2+ DTRs and 2+ pulses.  Lymph: Castro lymphadenopathy of posterior or anterior cervical chain or axillae bilaterally.  Gait antalgic MSK: Arthritic changes of multiple joints  Patient's left knee does have instability noted.  Arthritic changes noted.  Valgus deformity noted.  Tender to palpation mostly in the medial joint space contralateral  knee has some arthritic changes but Castro instability  After informed written and verbal consent, patient was seated on exam table. Left knee was prepped with alcohol swab and utilizing anterolateral approach, patient's left knee space was injected with 4:1  marcaine 0.5%: Kenalog 40mg /dL. Patient tolerated the procedure well without immediate complications.   Impression and Recommendations:     This case required medical decision making of moderate complexity. The above documentation has been reviewed and is accurate and complete Lyndal Pulley, DO       Note: This dictation was prepared with Dragon dictation along with smaller phrase technology. Any transcriptional errors that result from this process are unintentional.

## 2018-06-12 NOTE — Patient Instructions (Addendum)
Good to see you  I hope the injection helps  Stop the lexapro  Start cymbalta- will help with mood and pain hopefully  Once weekly vitamin D we will continue  Filled the cream and likely only need it every other day until rash is gone.  You know where I am  Be sfae

## 2018-06-12 NOTE — Assessment & Plan Note (Signed)
Left knee injected again today.  Tolerated procedure well.  Discussed icing regimen and home exercise.  Did not respond well to the viscosupplementation.  Follow-up with me again 10 weeks

## 2018-06-12 NOTE — Assessment & Plan Note (Signed)
History of spinal stenosis likely contributing to some lower extremity weakness.  Patient does have a little underlying anxiety and depression who has been treated with a very low dose of Lexapro.  Due to the pain aspect of this I do believe that Cymbalta could be beneficial.  Warned potential side effects.  Discontinue the Lexapro.  Will send message care provider to make other changes but I think patient will do well.  Patient is in agreement with the plan.  Wrote down instructions for caregivers.  Patient has any difficulty she will call.

## 2018-06-13 ENCOUNTER — Other Ambulatory Visit: Payer: Self-pay

## 2018-06-13 MED ORDER — VITAMIN D (ERGOCALCIFEROL) 1.25 MG (50000 UNIT) PO CAPS: ORAL_CAPSULE | ORAL | 0 refills | Status: DC

## 2018-06-26 ENCOUNTER — Telehealth: Payer: Self-pay | Admitting: *Deleted

## 2018-06-26 NOTE — Telephone Encounter (Signed)
Copied from [facility] [id number]. Topic: General - Other >> Jun 26, 2018 10:23 AM Carolyn Stare wrote:  Pt call to say the below med ause her to have muscle cramps and she wanted Dr Tamala Julian to know that she has stop taking the medication >> Jun 26, 2018 10:39 AM Morphies, Isidoro Donning wrote: DULoxetine (CYMBALTA) 20 MG capsule

## 2018-09-10 ENCOUNTER — Other Ambulatory Visit: Payer: Self-pay

## 2018-09-10 MED ORDER — VITAMIN D (ERGOCALCIFEROL) 1.25 MG (50000 UNIT) PO CAPS: ORAL_CAPSULE | ORAL | 0 refills | Status: DC

## 2018-09-10 NOTE — Progress Notes (Unsigned)
Paper refill filled.

## 2018-10-08 ENCOUNTER — Encounter: Payer: Self-pay | Admitting: Family Medicine

## 2018-10-08 ENCOUNTER — Other Ambulatory Visit: Payer: Self-pay

## 2018-10-08 ENCOUNTER — Ambulatory Visit: Payer: Self-pay

## 2018-10-08 ENCOUNTER — Ambulatory Visit (INDEPENDENT_AMBULATORY_CARE_PROVIDER_SITE_OTHER): Payer: Medicare Other | Admitting: Family Medicine

## 2018-10-08 VITALS — BP 142/62 | HR 93 | Ht 65.0 in

## 2018-10-08 DIAGNOSIS — G8929 Other chronic pain: Secondary | ICD-10-CM

## 2018-10-08 DIAGNOSIS — M25512 Pain in left shoulder: Secondary | ICD-10-CM

## 2018-10-08 DIAGNOSIS — M19011 Primary osteoarthritis, right shoulder: Secondary | ICD-10-CM | POA: Diagnosis not present

## 2018-10-08 DIAGNOSIS — M19019 Primary osteoarthritis, unspecified shoulder: Secondary | ICD-10-CM | POA: Insufficient documentation

## 2018-10-08 DIAGNOSIS — M12811 Other specific arthropathies, not elsewhere classified, right shoulder: Secondary | ICD-10-CM | POA: Insufficient documentation

## 2018-10-08 NOTE — Patient Instructions (Signed)
No lifting anymore than a coffee cup Compression sleeve for the bicep See me when you need me

## 2018-10-08 NOTE — Assessment & Plan Note (Signed)
Patient given injection.  Discussed icing regimen and home exercise, discussed which activities of doing which wants to avoid.  Believe that this will be a chronic problem.  Has had significant number of different injuries follow-up with me again 8 to 12 weeks

## 2018-10-08 NOTE — Progress Notes (Signed)
[name] [address] [place], [place] [address] Phone: [phone] Subjective:   I [name] am serving as a Education administrator for Dr. Hulan Saas.   CC: Bilateral shoulder pain right greater than left  [id number]   06/12/2018 Left knee injected again today.  Tolerated procedure well.  Discussed icing regimen and home exercise.  Did not respond well to the viscosupplementation.  Follow-up with me again 10 weeks  History of spinal stenosis likely contributing to some lower extremity weakness.  Patient does have a little underlying anxiety and depression who has been treated with a very low dose of Lexapro.  Due to the pain aspect of this I do believe that Cymbalta could be beneficial.  Warned potential side effects.  Discontinue the Lexapro.  Will send message care provider to make other changes but I think patient will do well.  Patient is in agreement with the plan.  Wrote down instructions for caregivers.  Patient has any difficulty she will call.  10/08/2018 JANNE MCMELLON is a 78 y.o. female coming in with complaint of bilateral shoulder pain. Patient states she feels like her shoulders have been "hit with a baseball". Patient feels like it is more the right side.  No severe pain.  Has decreasing range of motion.  Waking her up at night.    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   Procedure: TOTAL KNEE ARTHROPLASTY;  Surgeon: Mauri Pole, MD;  Location: [facility];  Service: Orthopedics;  Laterality: Right;   Social History   Socioeconomic History   Marital status: Married    Spouse name: Not on file   Number of children: Not on file   Years of education: Not on file   Highest education level: Not on file  Occupational History   Not on file  Social Needs   Financial resource strain: Not on file   Food insecurity    Worry: Not on file    Inability: Not on file   Transportation needs    Medical: Not on file    Non-medical: Not on file  Tobacco Use   Smoking status: Former Smoker    Packs/day: 0.50    Years: 20.00    Pack years: 10.00    Types: Cigarettes    Quit date: 02/07/2011    Years since quitting: 7.6   Smokeless tobacco: Never Used  Substance and Sexual Activity   Alcohol use: Yes    Alcohol/week: 7.0 standard drinks    Types: 7 Standard drinks or equivalent per week   Drug use: No   Sexual activity: Not on file  Lifestyle   Physical activity    Days per week: Not on file    Minutes per session: Not on file   Stress: Not  on file  Relationships   Social connections    Talks on phone: Not on file    Gets together: Not on file    Attends religious service: Not on file    Active member of club or organization: Not on file    Attends meetings of clubs or organizations: Not on file    Relationship status: Not on file  Other Topics Concern   Not on file  Social History Narrative   Not on file   Allergies  Allergen Reactions   Sulfa Antibiotics Rash   Family History  Problem Relation Age of Onset   Cancer Father        throat   Cancer Sister        non hodgkins lymphoma   Colon cancer Maternal Grandmother 71    Current Outpatient Medications (Endocrine & Metabolic):    predniSONE (DELTASONE) 20 MG tablet, Take 2 tablets (40 mg total) by mouth daily with  breakfast.   Current Outpatient Medications (Cardiovascular):    amlodipine-atorvastatin (CADUET) 2.5-10 MG tablet, Take 1 tablet by mouth daily.   hydrochlorothiazide (HYDRODIURIL) 25 MG tablet, Take 25 mg by mouth every morning.   lovastatin (MEVACOR) 40 MG tablet, Take 20 mg by mouth at bedtime.   olmesartan (BENICAR) 40 MG tablet, Take 40 mg by mouth daily.         Current Outpatient Medications (Other):    Calcium Carbonate-Vitamin D (CALCIUM 600 + D PO), Take 1 tablet by mouth 2 (two) times daily.   cholecalciferol (VITAMIN D) 400 UNITS TABS, Take 400-800 Units by mouth 2 (two) times daily. Pt takes 400 mg at night. 800 mg qam   diazepam (VALIUM) 5 MG tablet, Take 1 tablet 30 minutes before procedure.   DULoxetine (CYMBALTA) 20 MG capsule, Take 1 capsule (20 mg total) by mouth daily.   fish oil-omega-3 fatty acids 1000 MG capsule, Take 2 g by mouth 2 (two) times daily.   gabapentin (NEURONTIN) 300 MG capsule, nightly   Multiple Vitamins-Minerals (MULTIVITAMIN WITH MINERALS) tablet, Take 1 tablet by mouth daily.   Multiple Vitamins-Minerals (OCUVITE PRESERVISION PO), Take 2 capsules by mouth daily.   Red Yeast Rice Extract 600 MG CAPS, Take 600 mg by mouth 2 (two) times daily.   triamcinolone cream (KENALOG) 0.5 %, Apply 1 application topically 2 (two) times daily. To affected areas.   TURMERIC PO, Take 500 mg by mouth 2 (two) times daily.   Vitamin D, Ergocalciferol, (DRISDOL) 1.25 MG (50000 UT) CAPS capsule, TAKE 1 CAPSULE BY MOUTH EVERY 7 DAYS  Current Facility-Administered Medications (Other):    0.9 %  sodium chloride infusion    Past medical history, social, surgical and family history all reviewed in electronic medical record.  No pertanent information unless stated regarding to the chief complaint.   Review of Systems:  No headache, visual changes, nausea, vomiting, diarrhea, constipation, dizziness, abdominal pain, skin rash, fevers, chills, night  sweats, weight loss, swollen lymph nodes, chest pain, shortness of breath, mood changes.  Positive muscle aches, body aches  Objective  Blood pressure (!) 142/62, pulse 93, height 5\' 5"  (1.651 m), SpO2 95 %.    General: No apparent distress alert and oriented x3 mood and affect normal, dressed appropriately.  HEENT: Pupils severe cataracts in the right eye Respiratory: Patient's speak in full sentences and does not appear short of breath  Cardiovascular: Trace lower extremity edema, non tender, no erythema  Skin: Warm dry intact with no signs of infection or rash  on extremities or on axial skeleton.  Abdomen: Soft nontender  Neuro: Cranial nerves II through XII are intact, neurovascularly intact in all extremities with 2+ DTRs and 2+ pulses.  Lymph: No lymphadenopathy of posterior or anterior cervical chain or axillae bilaterally.  Gait antalgic gait.  MSK: Significant arthritic changes  Right shoulder has significant crepitus, loss of muscle atrophy noted.  Positive speeds, positive impingement with significant crepitus in all range of motions.  Contralateral shoulder arthritic changes but minimally tender compared.  Procedure: Real-time Ultrasound Guided Injection of right glenohumeral joint Device: GE Logiq Q7  Ultrasound guided injection is preferred based studies that show increased duration, increased effect, greater accuracy, decreased procedural pain, increased response rate with ultrasound guided versus blind injection.  Verbal informed consent obtained.  Time-out conducted.  Noted no overlying erythema, induration, or other signs of local infection.  Skin prepped in a sterile fashion.  Local anesthesia: Topical Ethyl chloride.  With sterile technique and under real time ultrasound guidance:  Joint visualized.  23g 1  inch needle inserted posterior approach. Pictures taken for needle placement. Patient did have injection of 2 cc of 1% lidocaine, 2 cc of 0.5% Marcaine, and 1.0 cc  of Kenalog 40 mg/dL. Completed without difficulty  Pain immediately resolved suggesting accurate placement of the medication.  Advised to call if fevers/chills, erythema, induration, drainage, or persistent bleeding.  Images permanently stored and available for review in the ultrasound unit.  Impression: Technically successful ultrasound guided injection.  Procedure: Real-time Ultrasound Guided Injection of right acromioclavicular joint Device: GE Logiq Q7  Ultrasound guided injection is preferred based studies that show increased duration, increased effect, greater accuracy, decreased procedural pain, increased response rate with ultrasound guided versus blind injection.  Verbal informed consent obtained.  Time-out conducted.  Noted no overlying erythema, induration, or other signs of local infection.  Skin prepped in a sterile fashion.  Local anesthesia: Topical Ethyl chloride.  With sterile technique and under real time ultrasound guidance:  Joint visualized.  25-gauge half inch needle injected with 0.5 cc of 0.5% Marcaine and 0.5 cc of Kenalog 40 mg/mL Completed without difficulty  Pain immediately resolved suggesting accurate placement of the medication.  Advised to call if fevers/chills, erythema, induration, drainage, or persistent bleeding.  Images permanently stored and available for review in the ultrasound unit.  Impression: Technically successful ultrasound guided injection.   Impression and Recommendations:     This case required medical decision making of moderate complexity. The above documentation has been reviewed and is accurate and complete Lyndal Pulley, DO       Note: This dictation was prepared with Dragon dictation along with smaller phrase technology. Any transcriptional errors that result from this process are unintentional.

## 2018-10-08 NOTE — Assessment & Plan Note (Signed)
Injection given.  Discussed which activities to do which wants to avoid.  Increase activity as tolerated.  Follow-up again in 4 to 6 weeks.

## 2018-10-15 DIAGNOSIS — Z23 Encounter for immunization: Secondary | ICD-10-CM | POA: Diagnosis not present

## 2018-11-26 DIAGNOSIS — K429 Umbilical hernia without obstruction or gangrene: Secondary | ICD-10-CM | POA: Diagnosis not present

## 2018-11-26 DIAGNOSIS — R7309 Other abnormal glucose: Secondary | ICD-10-CM | POA: Diagnosis not present

## 2018-11-26 DIAGNOSIS — M8580 Other specified disorders of bone density and structure, unspecified site: Secondary | ICD-10-CM | POA: Diagnosis not present

## 2018-11-26 DIAGNOSIS — Z0000 Encounter for general adult medical examination without abnormal findings: Secondary | ICD-10-CM | POA: Diagnosis not present

## 2018-11-26 DIAGNOSIS — F341 Dysthymic disorder: Secondary | ICD-10-CM | POA: Diagnosis not present

## 2018-11-26 DIAGNOSIS — E782 Mixed hyperlipidemia: Secondary | ICD-10-CM | POA: Diagnosis not present

## 2018-11-26 DIAGNOSIS — I10 Essential (primary) hypertension: Secondary | ICD-10-CM | POA: Diagnosis not present

## 2018-11-26 DIAGNOSIS — Z23 Encounter for immunization: Secondary | ICD-10-CM | POA: Diagnosis not present

## 2018-11-26 DIAGNOSIS — Z1389 Encounter for screening for other disorder: Secondary | ICD-10-CM | POA: Diagnosis not present

## 2018-12-04 ENCOUNTER — Encounter: Payer: Self-pay | Admitting: Family Medicine

## 2018-12-04 ENCOUNTER — Ambulatory Visit (INDEPENDENT_AMBULATORY_CARE_PROVIDER_SITE_OTHER): Payer: Medicare Other | Admitting: Family Medicine

## 2018-12-04 ENCOUNTER — Other Ambulatory Visit: Payer: Self-pay

## 2018-12-04 ENCOUNTER — Ambulatory Visit (INDEPENDENT_AMBULATORY_CARE_PROVIDER_SITE_OTHER)
Admission: RE | Admit: 2018-12-04 | Discharge: 2018-12-04 | Disposition: A | Discharge status: Home / Self Care | Payer: Medicare Other | Source: Ambulatory Visit | Attending: Family Medicine | Admitting: Family Medicine

## 2018-12-04 VITALS — BP 146/90 | HR 88 | Ht 65.0 in | Wt 145.0 lb

## 2018-12-04 DIAGNOSIS — M545 Low back pain, unspecified: Secondary | ICD-10-CM

## 2018-12-04 DIAGNOSIS — M5416 Radiculopathy, lumbar region: Secondary | ICD-10-CM

## 2018-12-04 DIAGNOSIS — S42421A Displaced comminuted supracondylar fracture without intercondylar fracture of right humerus, initial encounter for closed fracture: Secondary | ICD-10-CM

## 2018-12-04 DIAGNOSIS — M25559 Pain in unspecified hip: Secondary | ICD-10-CM | POA: Diagnosis not present

## 2018-12-04 DIAGNOSIS — S3993XA Unspecified injury of pelvis, initial encounter: Secondary | ICD-10-CM | POA: Diagnosis not present

## 2018-12-04 MED ORDER — TIZANIDINE HCL 2 MG PO CAPS: 2.0000 mg | ORAL_CAPSULE | Freq: Every evening | ORAL | 1 refills | Status: DC | PRN

## 2018-12-04 NOTE — Assessment & Plan Note (Addendum)
And swelling from acute fall.  Discussed the possibility of repeating x-rays but patient actually has good range of motion and good grip strength.  Patient does not want to do anything else different for her.  Discussed the possibility of a sling which patient declined.  We will see patient again in 1 week

## 2018-12-04 NOTE — Progress Notes (Signed)
[facility] [address] [place], [place] [address] Phone: [phone] Subjective:      CC: Recent fall  [id number]   10/08/2018 Injection given.  Discussed which activities to do which wants to avoid.  Increase activity as tolerated.  Follow-up again in 4 to 6 weeks  Patient given injection.  Discussed icing regimen and home exercise, discussed which activities of doing which wants to avoid.  Believe that this will be a chronic problem.  Has had significant number of different injuries follow-up with me again 8 to 12 weeks  12/04/2018 Sarah Castro is a 78 y.o. female coming in with complaint of right arm and back pain. States she landed on her right side. States the back is worse than the arm.   Onset- 10/24 night  Location - lower back  Duration-  Character- spasms  Aggravating factors- moving Reliving factors-  Therapies tried- Ibuprofen  Severity-  6/10 at its worse     Past Medical History:  Diagnosis Date   Arthritis    Hypercholesteremia    Hypertension    PONV (postoperative nausea and vomiting)    Past Surgical History:  Procedure Laterality Date   BUNIONECTOMY  2013   CATARACT EXTRACTION     right   HAMMER TOE SURGERY  2013   HEMATOMA EVACUATION  5 years ago   KNEE ARTHROSCOPY     right   LAPAROSCOPIC APPENDECTOMY N/A 10/24/2012   Procedure: APPENDECTOMY LAPAROSCOPIC;  Surgeon: Rolm Bookbinder, MD;  Location: [facility];  Service: General;  Laterality: N/A;   [place]   age 34   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   Procedure: TOTAL KNEE ARTHROPLASTY;  Surgeon: Mauri Pole, MD;  Location: [facility];  Service: Orthopedics;  Laterality: Right;   Social History   Socioeconomic History   Marital status: Married    Spouse name: Not on file   Number of children: Not on file   Years of education: Not on file   Highest  education level: Not on file  Occupational History   Not on file  Social Needs   Financial resource strain: Not on file   Food insecurity    Worry: Not on file    Inability: Not on file   Transportation needs    Medical: Not on file    Non-medical: Not on file  Tobacco Use   Smoking status: Former Smoker    Packs/day: 0.50    Years: 20.00    Pack years: 10.00    Types: Cigarettes    Quit date: 02/07/2011    Years since quitting: 7.8   Smokeless tobacco: Never Used  Substance and Sexual Activity   Alcohol use: Yes    Alcohol/week: 7.0 standard drinks    Types: 7 Standard drinks or equivalent per week   Drug use: No   Sexual activity: Not on file  Lifestyle   Physical activity    Days per week: Not on file    Minutes per session: Not on file   Stress: Not on file  Relationships   Social connections    Talks on phone: Not on file    Gets together: Not on file    Attends religious service: Not on file    Active member of club or organization: Not on file    Attends meetings of clubs or organizations: Not on file    Relationship  status: Not on file  Other Topics Concern   Not on file  Social History Narrative   Not on file   Allergies  Allergen Reactions   Sulfa Antibiotics Rash   Family History  Problem Relation Age of Onset   Cancer Father        throat   Cancer Sister        non hodgkins lymphoma   Colon cancer Maternal Grandmother 31    Current Outpatient Medications (Endocrine & Metabolic):    predniSONE (DELTASONE) 20 MG tablet, Take 2 tablets (40 mg total) by mouth daily with breakfast.   Current Outpatient Medications (Cardiovascular):    amlodipine-atorvastatin (CADUET) 2.5-10 MG tablet, Take 1 tablet by mouth daily.   hydrochlorothiazide (HYDRODIURIL) 25 MG tablet, Take 25 mg by mouth every morning.   lovastatin (MEVACOR) 40 MG tablet, Take 20 mg by mouth at bedtime.   olmesartan (BENICAR) 40 MG tablet, Take 40 mg by mouth  daily.         Current Outpatient Medications (Other):    Calcium Carbonate-Vitamin D (CALCIUM 600 + D PO), Take 1 tablet by mouth 2 (two) times daily.   cholecalciferol (VITAMIN D) 400 UNITS TABS, Take 400-800 Units by mouth 2 (two) times daily. Pt takes 400 mg at night. 800 mg qam   diazepam (VALIUM) 5 MG tablet, Take 1 tablet 30 minutes before procedure.   DULoxetine (CYMBALTA) 20 MG capsule, Take 1 capsule (20 mg total) by mouth daily.   fish oil-omega-3 fatty acids 1000 MG capsule, Take 2 g by mouth 2 (two) times daily.   gabapentin (NEURONTIN) 300 MG capsule, nightly   Multiple Vitamins-Minerals (MULTIVITAMIN WITH MINERALS) tablet, Take 1 tablet by mouth daily.   Multiple Vitamins-Minerals (OCUVITE PRESERVISION PO), Take 2 capsules by mouth daily.   Red Yeast Rice Extract 600 MG CAPS, Take 600 mg by mouth 2 (two) times daily.   triamcinolone cream (KENALOG) 0.5 %, Apply 1 application topically 2 (two) times daily. To affected areas.   TURMERIC PO, Take 500 mg by mouth 2 (two) times daily.   Vitamin D, Ergocalciferol, (DRISDOL) 1.25 MG (50000 UT) CAPS capsule, TAKE 1 CAPSULE BY MOUTH EVERY 7 DAYS   tizanidine (ZANAFLEX) 2 MG capsule, Take 1 capsule (2 mg total) by mouth at bedtime as needed for muscle spasms.  Current Facility-Administered Medications (Other):    0.9 %  sodium chloride infusion    Past medical history, social, surgical and family history all reviewed in electronic medical record.  No pertanent information unless stated regarding to the chief complaint.   Review of Systems:  No headache, visual changes, nausea, vomiting, diarrhea, constipation, dizziness, abdominal pain, skin rash, fevers, chills, night sweats, weight loss, swollen lymph nodes, body aches, joint swelling, muscle aches, chest pain, shortness of breath, mood changes.   Objective  Blood pressure (!) 146/90, pulse 88, height 5\' 5"  (1.651 m), weight 145 lb (65.8 kg), SpO2 97  %. Systems examined below as of    General: No apparent distress alert and oriented x3 mood and affect normal, dressed appropriately.  HEENT: Significant cloudy appearance of the right eye and patient does have a contusion around the eye.  Patient minorly tender but no crepitus noted around the orbit Respiratory: Patient's speak in full sentences and does not appear short of breath  Cardiovascular: No lower extremity edema, non tender, no erythema  Skin: Warm dry intact with no signs of infection or rash on extremities or on axial skeleton.  Significant bruising  noted of the right upper extremity around the elbow. Abdomen: Soft nontender  Neuro: Cranial nerves II through XII are intact, neurovascularly intact in all extremities with 2+ DTRs and 2+ pulses.  Lymph: No lymphadenopathy of posterior or anterior cervical chain or axillae bilaterally.  Gait normal MSK: Significant arthritic changes in multiple joints.  Right elbow still has been chronic changes from the supracondylar fracture.  Limited range of motion.  Minimal tenderness to palpation.  Patient has about the same amount of movement as she had previously.  Good grip strength noted and neurovascularly intact distally.  No significant increase in pain of the right shoulder.  Back exam has more tenderness to palpation more in the paraspinal musculature lumbar spine from L4-S1 bilaterally.  No midline tenderness noted.  No radicular symptoms with straight leg test.  4+ out of 5 strength but symmetric.     Impression and Recommendations:     This case required medical decision making of moderate complexity. The above documentation has been reviewed and is accurate and complete Lyndal Pulley, DO       Note: This dictation was prepared with Dragon dictation along with smaller phrase technology. Any transcriptional errors that result from this process are unintentional.

## 2018-12-04 NOTE — Patient Instructions (Signed)
Lumbar and pelvis xray downstairs Arnica lotion for the eye Compression and ice for the elbow Ice back pennsaid pinkie amount topically 2 times daily as needed.  Will call if compression fracture Muscle relaxer if needed but be very careful  We will see you next week

## 2018-12-04 NOTE — Assessment & Plan Note (Signed)
Low back pain.  Patient did fall.  Need to rule out a compression fracture with history of osteoporosis.  Patient's elbow is swollen today to and has a history of a displaced supracondylar fracture but patient has as much movement as she has had since that injury.  We discussed with patient about a very small amount of muscle relaxer at night and warned of potential side effects.  We discussed icing regimen, topical anti-inflammatories.  Follow-up again in 1 week if patient does have a compression fracture she could be a candidate for kyphoplasty.

## 2018-12-10 ENCOUNTER — Ambulatory Visit: Payer: Medicare Other | Admitting: Family Medicine

## 2018-12-24 DIAGNOSIS — E871 Hypo-osmolality and hyponatremia: Secondary | ICD-10-CM | POA: Diagnosis not present

## 2019-04-22 ENCOUNTER — Ambulatory Visit (INDEPENDENT_AMBULATORY_CARE_PROVIDER_SITE_OTHER): Payer: Medicare PPO | Admitting: Family Medicine

## 2019-04-22 ENCOUNTER — Encounter: Payer: Self-pay | Admitting: Family Medicine

## 2019-04-22 ENCOUNTER — Other Ambulatory Visit: Payer: Self-pay

## 2019-04-22 DIAGNOSIS — M1712 Unilateral primary osteoarthritis, left knee: Secondary | ICD-10-CM | POA: Diagnosis not present

## 2019-04-22 DIAGNOSIS — M5416 Radiculopathy, lumbar region: Secondary | ICD-10-CM

## 2019-04-22 MED ORDER — KETOROLAC TROMETHAMINE 60 MG/2ML IM SOLN
60.0000 mg | Freq: Once | INTRAMUSCULAR | Status: AC
  Administered 2019-04-22: 60 mg via INTRAMUSCULAR

## 2019-04-22 MED ORDER — GABAPENTIN 100 MG PO CAPS: 100.0000 mg | ORAL_CAPSULE | Freq: Every day | ORAL | 0 refills | Status: DC

## 2019-04-22 MED ORDER — METHYLPREDNISOLONE ACETATE 80 MG/ML IJ SUSP
80.0000 mg | Freq: Once | INTRAMUSCULAR | Status: AC
  Administered 2019-04-22: 80 mg via INTRAMUSCULAR

## 2019-04-22 NOTE — Progress Notes (Signed)
[place] Phone: [phone] Subjective:   [name], am serving as a scribe for Dr. Hulan Saas. This visit occurred during the SARS-CoV-2 public health emergency.  Safety protocols were in place, including screening questions prior to the visit, additional usage of staff PPE, and extensive cleaning of exam room while observing appropriate contact time as indicated for disinfecting solutions.   I'm seeing this patient by the request  of:  Wenda Low, MD  CC: Low back and left knee pain  [id number]   12/04/2018 Low back pain.  Patient did fall.  Need to rule out a compression fracture with history of osteoporosis.  Patient's elbow is swollen today to and has a history of a displaced supracondylar fracture but patient has as much movement as she has had since that injury.  We discussed with patient about a very small amount of muscle relaxer at night and warned of potential side effects.  We discussed icing regimen, topical anti-inflammatories.  Follow-up again in 1 week if patient does have a compression fracture she could be a candidate for kyphoplasty.  And swelling from acute fall.  Discussed the possibility of repeating x-rays but patient actually has good range of motion and good grip strength.  Patient does not want to do anything else different for her.  Discussed the possibility of a sling which patient declined.  We will see patient again in 1 week  Update 04/22/2019 CARDELL ROTOLO is a 79 y.o. female coming in with complaint of left knee and low back pain. Patient states that weight bearing activity increases her pain in both areas.  Pain in back is right sided and radiates down to right hamstring.     Past Medical History:  Diagnosis Date  . Arthritis   . Hypercholesteremia   . Hypertension   . PONV (postoperative nausea and vomiting)    Past Surgical History:  Procedure Laterality  Date  . BUNIONECTOMY  2013  . CATARACT EXTRACTION     right  . [place]  2013  . HEMATOMA EVACUATION  5 years ago  . KNEE ARTHROSCOPY     right  . LAPAROSCOPIC APPENDECTOMY N/A 10/24/2012   Procedure: APPENDECTOMY LAPAROSCOPIC;  Surgeon: Rolm Bookbinder, MD;  Location: [facility];  Service: General;  Laterality: N/A;  . LAPAROSCOPIC APPENDECTOMY    . POSTERIOR TIBIAL TENDON REPAIR  1998  . TONSILLECTOMY   age 63  . 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 Marital status: Married    Spouse name: Not on file  . Number of children: Not on file  . Years of education: Not on file  . Highest education level: Not on file  Occupational History  . Not on file  Tobacco Use  . Smoking status: Former Smoker    Packs/day: 0.50    Years: 20.00    Pack years: 10.00    Types: Cigarettes    Quit date: 02/07/2011    Years since quitting: 8.2  . Smokeless tobacco: Never Used  Substance and Sexual Activity  . Alcohol use: Yes    Alcohol/week: 7.0 standard drinks    Types: 7 Standard drinks or equivalent per week  . Drug use: No  . Sexual activity: Not on file  Other Topics Concern  .  Not on file  Social History Narrative  . Not on file   Social Determinants of Health   Financial Resource Strain:   . Difficulty of Paying Living Expenses:   Food Insecurity:   . Worried About Charity fundraiser in the Last Year:   . Arboriculturist in the Last Year:   Transportation Needs:   . Film/video editor (Medical):   Marland Kitchen Lack of Transportation (Non-Medical):   Physical Activity:   . Days of Exercise per Week:   . Minutes of Exercise per Session:   Stress:   . Feeling of Stress :   Social Connections:   . Frequency of Communication with Friends and Family:   . Frequency of Social Gatherings with Friends and  Family:   . Attends Religious Services:   . Active Member of Clubs or Organizations:   . Attends Archivist Meetings:   Marland Kitchen Marital Status:    Allergies  Allergen Reactions  . Sulfa Antibiotics Rash   Family History  Problem Relation Age of Onset  . Cancer Father        throat  . Cancer Sister        non hodgkins lymphoma  . Colon cancer Maternal Grandmother 77    Current Outpatient Medications (Endocrine & Metabolic):  .  predniSONE (DELTASONE) 20 MG tablet, Take 2 tablets (40 mg total) by mouth daily with breakfast.   Current Outpatient Medications (Cardiovascular):  .  amlodipine-atorvastatin (CADUET) 2.5-10 MG tablet, Take 1 tablet by mouth daily. .  hydrochlorothiazide (HYDRODIURIL) 25 MG tablet, Take 25 mg by mouth every morning. .  lovastatin (MEVACOR) 40 MG tablet, Take 20 mg by mouth at bedtime. Marland Kitchen  olmesartan (BENICAR) 40 MG tablet, Take 40 mg by mouth daily.         Current Outpatient Medications (Other):  Marland Kitchen  Calcium Carbonate-Vitamin D (CALCIUM 600 + D PO), Take 1 tablet by mouth 2 (two) times daily. .  cholecalciferol (VITAMIN D) 400 UNITS TABS, Take 400-800 Units by mouth 2 (two) times daily. Pt takes 400 mg at night. 800 mg qam .  diazepam (VALIUM) 5 MG tablet, Take 1 tablet 30 minutes before procedure. .  fish oil-omega-3 fatty acids 1000 MG capsule, Take 2 g by mouth 2 (two) times daily. .  Multiple Vitamins-Minerals (MULTIVITAMIN WITH MINERALS) tablet, Take 1 tablet by mouth daily. .  Multiple Vitamins-Minerals (OCUVITE PRESERVISION PO), Take 2 capsules by mouth daily. .  Red Yeast Rice Extract 600 MG CAPS, Take 600 mg by mouth 2 (two) times daily. .  tizanidine (ZANAFLEX) 2 MG capsule, Take 1 capsule (2 mg total) by mouth at bedtime as needed for muscle spasms. Marland Kitchen  triamcinolone cream (KENALOG) 0.5 %, Apply 1 application topically 2 (two) times daily. To affected areas. .  TURMERIC PO, Take 500 mg by mouth 2 (two) times daily. .  Vitamin D,  Ergocalciferol, (DRISDOL) 1.25 MG (50000 UT) CAPS capsule, TAKE 1 CAPSULE BY MOUTH EVERY 7 DAYS .  gabapentin (NEURONTIN) 100 MG capsule, Take 1 capsule (100 mg total) by mouth at bedtime.  Current Facility-Administered Medications (Other):  .  0.9 %  sodium chloride infusion   Reviewed prior external information including notes and imaging from  primary care provider As well as notes that were available from care everywhere and other healthcare systems.  Past medical history, social, surgical and family history all reviewed in electronic medical record.  No pertanent information unless stated regarding to the chief complaint.  Review of Systems:  No headache, visual changes, nausea, vomiting, diarrhea, constipation, dizziness, abdominal pain, skin rash, fevers, chills, night sweats, weight loss, swollen lymph nodes,  chest pain, shortness of breath, mood changes. POSITIVE muscle aches, joint swelling, body aches  Objective  Blood pressure 132/76, pulse 80, height 5\' 5"  (1.651 m), weight 145 lb (65.8 kg), SpO2 97 %.   General: No apparent distress alert and oriented x3 mood and affect normal, dressed appropriately.  Respiratory: Patient's speak in full sentences and does not appear short of breath  Cardiovascular: 1+ lower extremity edema, non tender, no erythema  Skin: Warm dry intact with no signs of infection or rash on extremities or on axial skeleton.  Abdomen: Soft nontender  Neuro: Cranial nerves II through XII are intact, neurovascularly intact in all extremities with 2+ DTRs and 2+ pulses. .  Gait severely antalgic using the aid of a cane.  MSK: Arthritic changes of multiple joints Back exam does have some degenerative scoliosis noted.  Tender to palpation over the right sacroiliac joint.  Positive straight leg test noted.  4-5 strength of the lower extremities bilaterally.  Deep tendon reflexes otherwise appear to be intact  Left knee abnormal thigh to calf ratio.  Trace  effusion worse than the contralateral knee.  Mild instability with valgus and varus force.  Tender to palpation over the medial joint line.  After informed written and verbal consent, patient was seated on exam table. Left knee was prepped with alcohol swab and utilizing anterolateral approach, patient's left knee space was injected with 4:1  marcaine 0.5%: Kenalog 40mg /dL. Patient tolerated the procedure well without immediate complications.   Impression and Recommendations:     This case required medical decision making of moderate complexity. The above documentation has been reviewed and is accurate and complete Lyndal Pulley, DO       Note: This dictation was prepared with Dragon dictation along with smaller phrase technology. Any transcriptional errors that result from this process are unintentional.

## 2019-04-22 NOTE — Assessment & Plan Note (Signed)
Continue to have some radicular symptoms.  We discussed with patient in great length.  This is a chronic problem with a mild exacerbation.  Discussed different medications.  Patient is elected to retry the gabapentin that I think will be more beneficial for her.  Patient is to do home exercises and icing regimen.  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  Patient wants to avoid anything such as an epidural at the moment.  Follow-up again in 4 to 8 weeks

## 2019-04-22 NOTE — Assessment & Plan Note (Signed)
Injection given today, tolerated the procedure well, discussed icing regimen and home exercise, which activities to do which wants to avoid.  Increase activity slowly.  Chronic problem with exacerbation.  Social determinants of health including patient's other comorbidities including blindness and causes patient to have difficulty with transportation and walking.  Follow-up again in 4 to 6 weeks.

## 2019-04-22 NOTE — Patient Instructions (Signed)
Gabapentin 100-200mg  at night Injected knee today Injections in backside today See me again in 4 weeks if ok move it 8-10 weeks

## 2019-05-20 ENCOUNTER — Ambulatory Visit: Payer: Medicare PPO | Admitting: Family Medicine

## 2019-06-20 ENCOUNTER — Ambulatory Visit: Payer: Self-pay

## 2019-06-20 ENCOUNTER — Ambulatory Visit (INDEPENDENT_AMBULATORY_CARE_PROVIDER_SITE_OTHER): Payer: Medicare PPO | Admitting: Family Medicine

## 2019-06-20 ENCOUNTER — Encounter: Payer: Self-pay | Admitting: Family Medicine

## 2019-06-20 ENCOUNTER — Other Ambulatory Visit: Payer: Self-pay

## 2019-06-20 VITALS — BP 144/72 | HR 94 | Ht 65.0 in | Wt 144.0 lb

## 2019-06-20 DIAGNOSIS — M48062 Spinal stenosis, lumbar region with neurogenic claudication: Secondary | ICD-10-CM

## 2019-06-20 DIAGNOSIS — G9519 Other vascular myelopathies: Secondary | ICD-10-CM

## 2019-06-20 DIAGNOSIS — M25551 Pain in right hip: Secondary | ICD-10-CM | POA: Diagnosis not present

## 2019-06-20 DIAGNOSIS — G5701 Lesion of sciatic nerve, right lower limb: Secondary | ICD-10-CM | POA: Diagnosis not present

## 2019-06-20 DIAGNOSIS — M1712 Unilateral primary osteoarthritis, left knee: Secondary | ICD-10-CM

## 2019-06-20 NOTE — Assessment & Plan Note (Signed)
Repeat injection given again today.  Patient given a prescription for a 4 pronged cane.  Patient is not willing to do a walker or Rollator at the moment.  Discussed icing regimen, home exercises, which activities to doing which wants to avoid.  Encouraged topical anti-inflammatories.  Patient is not truly a surgical candidate and would not want to anyhow.  Follow-up with me again in 8 weeks

## 2019-06-20 NOTE — Assessment & Plan Note (Addendum)
Attempted injection today.  I do believe though that more secondary to the spinal stenosis and patient is concerned for the epidural at the moment.  We encourage her though to consider this if this injection does not help.  We do want to hold on any more medications but encouraged the gabapentin still.  Patient has been fairly noncompliant on different medications.  Follow-up with me again 8 weeks

## 2019-06-20 NOTE — Progress Notes (Signed)
[place] Phone: [phone] Subjective:   [name], am serving as a scribe for Dr. Hulan Saas. This visit occurred during the SARS-CoV-2 public health emergency.  Safety protocols were in place, including screening questions prior to the visit, additional usage of staff PPE, and extensive cleaning of exam room while observing appropriate contact time as indicated for disinfecting solutions.   I'm seeing this patient by the request  of:  Wenda Low, MD  CC: Left knee pain, back pain follow-up  [id number]   04/22/2019 Continue to have some radicular symptoms.  We discussed with patient in great length.  This is a chronic problem with a mild exacerbation.  Discussed different medications.  Patient is elected to retry the gabapentin that I think will be more beneficial for her.  Patient is to do home exercises and icing regimen.  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  Patient wants to avoid anything such as an epidural at the moment.  Follow-up again in 4 to 8 weeks  Injection given today, tolerated the procedure well, discussed icing regimen and home exercise, which activities to do which wants to avoid.  Increase activity slowly.  Chronic problem with exacerbation.  Social determinants of health including patient's other comorbidities including blindness and causes patient to have difficulty with transportation and walking.  Follow-up again in 4 to 6 weeks.  Update 06/20/2019 TORRANCE DECORDOVA is a 79 y.o. female coming in with complaint of left knee and back pain. Patient states that her back pain is getting worse. Weight bearing increases her back pain. Pain starts in right glute and radiates down into the anterior aspect of lower leg.   Injection last visit. Does wear brace for support. Pain has returned in the knee.     Past Medical History:  Diagnosis Date  . Arthritis   .  Hypercholesteremia   . Hypertension   . PONV (postoperative nausea and vomiting)    Past Surgical History:  Procedure Laterality Date  . BUNIONECTOMY  2013  . CATARACT EXTRACTION     right  . [place]  2013  . HEMATOMA EVACUATION  5 years ago  . KNEE ARTHROSCOPY     right  . LAPAROSCOPIC APPENDECTOMY N/A 10/24/2012   Procedure: APPENDECTOMY LAPAROSCOPIC;  Surgeon: Rolm Bookbinder, MD;  Location: [facility];  Service: General;  Laterality: N/A;  . LAPAROSCOPIC APPENDECTOMY    . POSTERIOR TIBIAL TENDON REPAIR  1998  . TONSILLECTOMY   age 70  . 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 Marital status: Married    Spouse name: Not on file  . Number of children: Not on file  . Years of education: Not on file  . Highest education level: Not on file  Occupational History  . Not on file  Tobacco Use  . Smoking status: Former Smoker    Packs/day: 0.50    Years: 20.00    Pack years: 10.00    Types: Cigarettes    Quit date: 02/07/2011    Years since quitting: 8.3  . Smokeless tobacco: Never Used  Substance and Sexual Activity  . Alcohol use: Yes    Alcohol/week: 7.0 standard drinks    Types: 7 Standard drinks or equivalent per week  . Drug  use: No  . Sexual activity: Not on file  Other Topics Concern  . Not on file  Social History Narrative  . Not on file   Social Determinants of Health   Financial Resource Strain:   . Difficulty of Paying Living Expenses:   Food Insecurity:   . Worried About Charity fundraiser in the Last Year:   . Arboriculturist in the Last Year:   Transportation Needs:   . Film/video editor (Medical):   Marland Kitchen Lack of Transportation (Non-Medical):   Physical Activity:   . Days of Exercise per Week:   . Minutes of Exercise per Session:   Stress:   . Feeling of Stress :    Social Connections:   . Frequency of Communication with Friends and Family:   . Frequency of Social Gatherings with Friends and Family:   . Attends Religious Services:   . Active Member of Clubs or Organizations:   . Attends Archivist Meetings:   Marland Kitchen Marital Status:    Allergies  Allergen Reactions  . Sulfa Antibiotics Rash   Family History  Problem Relation Age of Onset  . Cancer Father        throat  . Cancer Sister        non hodgkins lymphoma  . Colon cancer Maternal Grandmother 67    Current Outpatient Medications (Endocrine & Metabolic):  .  predniSONE (DELTASONE) 20 MG tablet, Take 2 tablets (40 mg total) by mouth daily with breakfast.   Current Outpatient Medications (Cardiovascular):  .  amlodipine-atorvastatin (CADUET) 2.5-10 MG tablet, Take 1 tablet by mouth daily. .  hydrochlorothiazide (HYDRODIURIL) 25 MG tablet, Take 25 mg by mouth every morning. .  lovastatin (MEVACOR) 40 MG tablet, Take 20 mg by mouth at bedtime. Marland Kitchen  olmesartan (BENICAR) 40 MG tablet, Take 40 mg by mouth daily.         Current Outpatient Medications (Other):  Marland Kitchen  Calcium Carbonate-Vitamin D (CALCIUM 600 + D PO), Take 1 tablet by mouth 2 (two) times daily. .  cholecalciferol (VITAMIN D) 400 UNITS TABS, Take 400-800 Units by mouth 2 (two) times daily. Pt takes 400 mg at night. 800 mg qam .  diazepam (VALIUM) 5 MG tablet, Take 1 tablet 30 minutes before procedure. .  fish oil-omega-3 fatty acids 1000 MG capsule, Take 2 g by mouth 2 (two) times daily. Marland Kitchen  gabapentin (NEURONTIN) 100 MG capsule, Take 1 capsule (100 mg total) by mouth at bedtime. .  Multiple Vitamins-Minerals (MULTIVITAMIN WITH MINERALS) tablet, Take 1 tablet by mouth daily. .  Multiple Vitamins-Minerals (OCUVITE PRESERVISION PO), Take 2 capsules by mouth daily. .  Red Yeast Rice Extract 600 MG CAPS, Take 600 mg by mouth 2 (two) times daily. .  tizanidine (ZANAFLEX) 2 MG capsule, Take 1 capsule (2 mg total) by mouth at  bedtime as needed for muscle spasms. Marland Kitchen  triamcinolone cream (KENALOG) 0.5 %, Apply 1 application topically 2 (two) times daily. To affected areas. .  TURMERIC PO, Take 500 mg by mouth 2 (two) times daily. .  Vitamin D, Ergocalciferol, (DRISDOL) 1.25 MG (50000 UT) CAPS capsule, TAKE 1 CAPSULE BY MOUTH EVERY 7 DAYS  Current Facility-Administered Medications (Other):  .  0.9 %  sodium chloride infusion   Reviewed prior external information including notes and imaging from  primary care provider As well as notes that were available from care everywhere and other healthcare systems.  Past medical history, social, surgical and family history all reviewed  in electronic medical record.  No pertanent information unless stated regarding to the chief complaint.   Review of Systems:  No headache, visual changes, nausea, vomiting, diarrhea, constipation, dizziness, abdominal pain, skin rash, fevers, chills, night sweats, weight loss, swollen lymph nodes, body aches, joint swelling, chest pain, shortness of breath, mood changes. POSITIVE muscle aches, body aches  Objective  Blood pressure (!) 144/72, pulse 94, height 5\' 5"  (1.651 m), weight 144 lb (65.3 kg), SpO2 97 %.   General: Appears uncomfortable but alert and oriented x3 HEENT: Pupils blind in the right eye Respiratory: Patient's speak in full sentences and does not appear short of breath  Cardiovascular: No lower extremity edema, non tender, no erythema  Gait severely antalgic Knee: Left valgus deformity noted.  Abnormal thigh to calf ratio.  Tender to palpation over medial and PF joint line.  Limited range of motion in flexion extension of 5 to 10 degrees. instability with valgus force.  painful patellar compression. Patellar glide with moderate crepitus. Patellar and quadriceps tendons unremarkable. Hamstring and quadriceps strength is normal.  Back exam still shows a positive straight leg test.  Severe tenderness over the piriformis  muscle on the right side.  Neurovascularly intact distally.  Weakness of the lower extremities bilaterally  After informed written and verbal consent, patient was seated on exam table. Left knee was prepped with alcohol swab and utilizing anterolateral approach, patient's left knee space was injected with 4:1  marcaine 0.5%: Kenalog 40mg /dL. Patient tolerated the procedure well without immediate complications.   Procedure: Real-time Ultrasound Guided Injection of right piriformis tendon sheath Device: GE Logiq Q7 Ultrasound guided injection is preferred based studies that show increased duration, increased effect, greater accuracy, decreased procedural pain, increased response rate, and decreased cost with ultrasound guided versus blind injection.  Verbal informed consent obtained.  Time-out conducted.  Noted no overlying erythema, induration, or other signs of local infection.  Skin prepped in a sterile fashion.  Local anesthesia: Topical Ethyl chloride.  With sterile technique and under real time ultrasound guidance: With a 21-gauge 2 inch needle injected into the piriformis sheath more proximally.  A total of 1 cc of 0.5% Marcaine and 1 cc of Kenalog 40 mg/mL used Completed without difficulty  Pain immediately resolved suggesting accurate placement of the medication.  Advised to call if fevers/chills, erythema, induration, drainage, or persistent bleeding.  Images permanently stored and available for review in the ultrasound unit.  Impression: Technically successful ultrasound guided injection.   Impression and Recommendations:     This case required medical decision making of moderate complexity. The above documentation has been reviewed and is accurate and complete Lyndal Pulley, DO       Note: This dictation was prepared with Dragon dictation along with smaller phrase technology. Any transcriptional errors that result from this process are unintentional.

## 2019-06-20 NOTE — Patient Instructions (Addendum)
Injected hip and knee today See me again in 2 months

## 2019-08-15 DIAGNOSIS — I872 Venous insufficiency (chronic) (peripheral): Secondary | ICD-10-CM | POA: Diagnosis not present

## 2019-08-15 DIAGNOSIS — E782 Mixed hyperlipidemia: Secondary | ICD-10-CM | POA: Diagnosis not present

## 2019-08-15 DIAGNOSIS — R7303 Prediabetes: Secondary | ICD-10-CM | POA: Diagnosis not present

## 2019-08-15 DIAGNOSIS — I10 Essential (primary) hypertension: Secondary | ICD-10-CM | POA: Diagnosis not present

## 2019-08-19 ENCOUNTER — Ambulatory Visit (INDEPENDENT_AMBULATORY_CARE_PROVIDER_SITE_OTHER): Payer: Medicare PPO | Admitting: Family Medicine

## 2019-08-19 ENCOUNTER — Encounter: Payer: Self-pay | Admitting: Family Medicine

## 2019-08-19 ENCOUNTER — Other Ambulatory Visit: Payer: Self-pay

## 2019-08-19 DIAGNOSIS — M1712 Unilateral primary osteoarthritis, left knee: Secondary | ICD-10-CM

## 2019-08-19 NOTE — Patient Instructions (Signed)
See me again in 8 weeks 

## 2019-08-19 NOTE — Progress Notes (Signed)
[facility] [place] Phone: [phone] Subjective:   Sarah Castro, am serving as a scribe for Dr. Hulan Saas. This visit occurred during the SARS-CoV-2 public health emergency.  Safety protocols were in place, including screening questions prior to the visit, additional usage of staff PPE, and extensive cleaning of exam room while observing appropriate contact time as indicated for disinfecting solutions.   I'm seeing this patient by the request  of:  Wenda Low, MD  CC: Knee pain follow-up  [id number]   06/20/2019 Repeat injection given again today.  Patient given a prescription for a 4 pronged cane.  Patient is not willing to do a walker or Rollator at the moment.  Discussed icing regimen, home exercises, which activities to doing which wants to avoid.  Encouraged topical anti-inflammatories.  Patient is not truly a surgical candidate and would not want to anyhow.  Follow-up with me again in 8 weeks  Attempted injection today.  I do believe though that more secondary to the spinal stenosis and patient is concerned for the epidural at the moment.  We encourage her though to consider this if this injection does not help.  We do want to hold on any more medications but encouraged the gabapentin still.  Patient has been fairly noncompliant on different medications.  Follow-up with me again 8 weeks  Update 08/19/2019 Sarah Castro is a 79 y.o. female coming in with complaint of right hip pain and left knee pain. Patient states that the injection is wearing off and she would like an injection in left knee.  Patient has known arthritic changes.  Had a knee replacement on the contralateral side and does not want to do that.  Patient is not a surgical candidate at this moment.  States that the pain is severe enough that is stopping her from activities.  Feels that the gluteal pain and the greater trochanteric pain is  improved.    Past Medical History:  Diagnosis Date  . Arthritis   . Hypercholesteremia   . Hypertension   . PONV (postoperative nausea and vomiting)    Past Surgical History:  Procedure Laterality Date  . BUNIONECTOMY  2013  . CATARACT EXTRACTION     right  . [place]  2013  . HEMATOMA EVACUATION  5 years ago  . KNEE ARTHROSCOPY     right  . LAPAROSCOPIC APPENDECTOMY N/A 10/24/2012   Procedure: APPENDECTOMY LAPAROSCOPIC;  Surgeon: Rolm Bookbinder, MD;  Location: [facility];  Service: General;  Laterality: N/A;  . LAPAROSCOPIC APPENDECTOMY    . POSTERIOR TIBIAL TENDON REPAIR  1998  . TONSILLECTOMY   age 46  . 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 Marital status: Married    Spouse name: Not on file  . Number of children: Not on file  . Years of education: Not on file  . Highest education level: Not on file  Occupational History  . Not on file  Tobacco Use  . Smoking status: Former Smoker    Packs/day: 0.50    Years: 20.00    Pack years: 10.00    Types: Cigarettes    Quit date: 02/07/2011    Years since quitting: 8.5  . Smokeless tobacco: Never Used  Vaping Use  . Vaping Use: Never  used  Substance and Sexual Activity  . Alcohol use: Yes    Alcohol/week: 7.0 standard drinks    Types: 7 Standard drinks or equivalent per week  . Drug use: Castro  . Sexual activity: Not on file  Other Topics Concern  . Not on file  Social History Narrative  . Not on file   Social Determinants of Health   Financial Resource Strain:   . Difficulty of Paying Living Expenses:   Food Insecurity:   . Worried About Charity fundraiser in the Last Year:   . Arboriculturist in the Last Year:   Transportation Needs:   . Film/video editor (Medical):   Marland Kitchen Lack of Transportation (Non-Medical):   Physical  Activity:   . Days of Exercise per Week:   . Minutes of Exercise per Session:   Stress:   . Feeling of Stress :   Social Connections:   . Frequency of Communication with Friends and Family:   . Frequency of Social Gatherings with Friends and Family:   . Attends Religious Services:   . Active Member of Clubs or Organizations:   . Attends Archivist Meetings:   Marland Kitchen Marital Status:    Allergies  Allergen Reactions  . Sulfa Antibiotics Rash   Family History  Problem Relation Age of Onset  . Cancer Father        throat  . Cancer Sister        non hodgkins lymphoma  . Colon cancer Maternal Grandmother 44    Current Outpatient Medications (Endocrine & Metabolic):  .  predniSONE (DELTASONE) 20 MG tablet, Take 2 tablets (40 mg total) by mouth daily with breakfast.   Current Outpatient Medications (Cardiovascular):  .  amlodipine-atorvastatin (CADUET) 2.5-10 MG tablet, Take 1 tablet by mouth daily. .  hydrochlorothiazide (HYDRODIURIL) 25 MG tablet, Take 25 mg by mouth every morning. .  lovastatin (MEVACOR) 40 MG tablet, Take 20 mg by mouth at bedtime. Marland Kitchen  olmesartan (BENICAR) 40 MG tablet, Take 40 mg by mouth daily.         Current Outpatient Medications (Other):  Marland Kitchen  Calcium Carbonate-Vitamin D (CALCIUM 600 + D PO), Take 1 tablet by mouth 2 (two) times daily. .  cholecalciferol (VITAMIN D) 400 UNITS TABS, Take 400-800 Units by mouth 2 (two) times daily. Pt takes 400 mg at night. 800 mg qam .  diazepam (VALIUM) 5 MG tablet, Take 1 tablet 30 minutes before procedure. .  fish oil-omega-3 fatty acids 1000 MG capsule, Take 2 g by mouth 2 (two) times daily. Marland Kitchen  gabapentin (NEURONTIN) 100 MG capsule, Take 1 capsule (100 mg total) by mouth at bedtime. .  Multiple Vitamins-Minerals (MULTIVITAMIN WITH MINERALS) tablet, Take 1 tablet by mouth daily. .  Multiple Vitamins-Minerals (OCUVITE PRESERVISION PO), Take 2 capsules by mouth daily. .  Red Yeast Rice Extract 600 MG CAPS, Take  600 mg by mouth 2 (two) times daily. .  tizanidine (ZANAFLEX) 2 MG capsule, Take 1 capsule (2 mg total) by mouth at bedtime as needed for muscle spasms. Marland Kitchen  triamcinolone cream (KENALOG) 0.5 %, Apply 1 application topically 2 (two) times daily. To affected areas. .  TURMERIC PO, Take 500 mg by mouth 2 (two) times daily. .  Vitamin D, Ergocalciferol, (DRISDOL) 1.25 MG (50000 UT) CAPS capsule, TAKE 1 CAPSULE BY MOUTH EVERY 7 DAYS  Current Facility-Administered Medications (Other):  .  0.9 %  sodium chloride infusion   Reviewed prior external information including notes  and imaging from  primary care provider As well as notes that were available from care everywhere and other healthcare systems.  Past medical history, social, surgical and family history all reviewed in electronic medical record.  Castro pertanent information unless stated regarding to the chief complaint.   Review of Systems:  Castro headache, visual changes, nausea, vomiting, diarrhea, constipation, dizziness, abdominal pain, skin rash, fevers, chills, night sweats, weight loss, swollen lymph nodes,  chest pain, shortness of breath, mood changes. POSITIVE muscle aches, body aches, joint swelling  Objective  Blood pressure (!) 146/84, pulse (!) 49, height 5\' 5"  (1.651 m), weight 144 lb (65.3 kg), SpO2 96 %.   General: Castro apparent distress alert and oriented x3 mood and affect normal, dressed appropriately.  HEENT: Pupils patient does have significant cataracts of the right Respiratory: Patient's speak in full sentences and does not appear short of breath  Cardiovascular: 2+ lower extremity edema, non tender, Castro erythema   Gait antalgic.  Using a cane MSK:  Left knee exam does have some instability noted.  Patient does have a with valgus and varus force.  Swelling noted.  Lacks the last 15 degrees of flexion last 5 degrees of extension.  After informed written and verbal consent, patient was seated on exam table. Left knee was  prepped with alcohol swab and utilizing anterolateral approach, patient's left knee space was injected with 4:1  marcaine 0.5%: Kenalog 40mg /dL. Patient tolerated the procedure well without immediate complications.    Impression and Recommendations:     The above documentation has been reviewed and is accurate and complete Lyndal Pulley, DO       Note: This dictation was prepared with Dragon dictation along with smaller phrase technology. Any transcriptional errors that result from this process are unintentional.

## 2019-08-19 NOTE — Assessment & Plan Note (Signed)
Chronic problem with exacerbation.  Secondary to social determinants of health and patient's other comorbidities do not have many other different treatment options in this individual.  Steroid injection was given again today.  We will continue to use a cane with any type of walking and potentially even a walker when possible.

## 2019-08-29 ENCOUNTER — Encounter: Payer: Self-pay | Admitting: Family Medicine

## 2019-08-29 ENCOUNTER — Ambulatory Visit (INDEPENDENT_AMBULATORY_CARE_PROVIDER_SITE_OTHER): Payer: Medicare PPO | Admitting: Family Medicine

## 2019-08-29 ENCOUNTER — Other Ambulatory Visit: Payer: Self-pay

## 2019-08-29 DIAGNOSIS — G5701 Lesion of sciatic nerve, right lower limb: Secondary | ICD-10-CM | POA: Diagnosis not present

## 2019-08-29 NOTE — Assessment & Plan Note (Signed)
Chronic problem with worsening symptoms.  Due to some patient's other comorbidities unable to do many other treatment options.  We will continue the gabapentin.  Consider the possibility of epidurals in the back if this continues.  Discussed icing regimen and home exercises.  Follow-up again in 4 to 8 weeks.

## 2019-08-29 NOTE — Progress Notes (Signed)
[facility] [address] [place] Phone: [phone] Subjective:   I Sarah Castro am serving as a Education administrator for Dr. Hulan Saas.  This visit occurred during the SARS-CoV-2 public health emergency.  Safety protocols were in place, including screening questions prior to the visit, additional usage of staff PPE, and extensive cleaning of exam room while observing appropriate contact time as indicated for disinfecting solutions.   I'm seeing this patient by the request  of:  Wenda Low, MD  CC: Back pain follow-up  [id number]   08/19/2019 Chronic problem with exacerbation.  Secondary to social determinants of health and patient's other comorbidities do not have many other different treatment options in this individual.  Steroid injection was given again today.  We will continue to use a cane with any type of walking and potentially even a walker when possible.  08/29/2019 Sarah Castro is a 79 y.o. female coming in with complaint of left knee pain. Patient states she will be getting an injection.  Patient has had known piriformis syndrome previously.  Also has has lumbar radiculopathy.  Patient feels that the piriformis is already giving her more the problem and is causing some mild radicular symptoms.  Patient has responded well to the injections previously.     Past Medical History:  Diagnosis Date  . Arthritis   . Hypercholesteremia   . Hypertension   . PONV (postoperative nausea and vomiting)    Past Surgical History:  Procedure Laterality Date  . BUNIONECTOMY  2013  . CATARACT EXTRACTION     right  . [place]  2013  . HEMATOMA EVACUATION  5 years ago  . KNEE ARTHROSCOPY     right  . LAPAROSCOPIC APPENDECTOMY N/A 10/24/2012   Procedure: APPENDECTOMY LAPAROSCOPIC;  Surgeon: Rolm Bookbinder, MD;  Location: [facility];  Service: General;  Laterality: N/A;  . LAPAROSCOPIC APPENDECTOMY    . POSTERIOR TIBIAL TENDON  REPAIR  1998  . TONSILLECTOMY   age 55  . 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 Marital status: Married    Spouse name: Not on file  . Number of children: Not on file  . Years of education: Not on file  . Highest education level: Not on file  Occupational History  . Not on file  Tobacco Use  . Smoking status: Former Smoker    Packs/day: 0.50    Years: 20.00    Pack years: 10.00    Types: Cigarettes    Quit date: 02/07/2011    Years since quitting: 8.5  . Smokeless tobacco: Never Used  Vaping Use  . Vaping Use: Never used  Substance and Sexual Activity  . Alcohol use: Yes    Alcohol/week: 7.0 standard drinks    Types: 7 Standard drinks or equivalent per week  . Drug use: No  . Sexual activity: Not on file  Other Topics Concern  . Not on file  Social History Narrative  . Not on file   Social Determinants of Health   Financial Resource Strain:   . Difficulty of Paying Living Expenses:   Food Insecurity:   . Worried About Charity fundraiser in the Last Year:   . [place] in the Last Year:   Transportation Needs:   . Lack of  Transportation (Medical):   Marland Kitchen Lack of Transportation (Non-Medical):   Physical Activity:   . Days of Exercise per Week:   . Minutes of Exercise per Session:   Stress:   . Feeling of Stress :   Social Connections:   . Frequency of Communication with Friends and Family:   . Frequency of Social Gatherings with Friends and Family:   . Attends Religious Services:   . Active Member of Clubs or Organizations:   . Attends Archivist Meetings:   Marland Kitchen Marital Status:    Allergies  Allergen Reactions  . Sulfa Antibiotics Rash   Family History  Problem Relation Age of Onset  . Cancer Father        throat  . Cancer Sister        non hodgkins lymphoma  . Colon  cancer Maternal Grandmother 52    Current Outpatient Medications (Endocrine & Metabolic):  .  predniSONE (DELTASONE) 20 MG tablet, Take 2 tablets (40 mg total) by mouth daily with breakfast.   Current Outpatient Medications (Cardiovascular):  .  amlodipine-atorvastatin (CADUET) 2.5-10 MG tablet, Take 1 tablet by mouth daily. .  hydrochlorothiazide (HYDRODIURIL) 25 MG tablet, Take 25 mg by mouth every morning. .  lovastatin (MEVACOR) 40 MG tablet, Take 20 mg by mouth at bedtime. Marland Kitchen  olmesartan (BENICAR) 40 MG tablet, Take 40 mg by mouth daily.         Current Outpatient Medications (Other):  Marland Kitchen  Calcium Carbonate-Vitamin D (CALCIUM 600 + D PO), Take 1 tablet by mouth 2 (two) times daily. .  cholecalciferol (VITAMIN D) 400 UNITS TABS, Take 400-800 Units by mouth 2 (two) times daily. Pt takes 400 mg at night. 800 mg qam .  diazepam (VALIUM) 5 MG tablet, Take 1 tablet 30 minutes before procedure. .  fish oil-omega-3 fatty acids 1000 MG capsule, Take 2 g by mouth 2 (two) times daily. Marland Kitchen  gabapentin (NEURONTIN) 100 MG capsule, Take 1 capsule (100 mg total) by mouth at bedtime. .  Multiple Vitamins-Minerals (MULTIVITAMIN WITH MINERALS) tablet, Take 1 tablet by mouth daily. .  Multiple Vitamins-Minerals (OCUVITE PRESERVISION PO), Take 2 capsules by mouth daily. .  Red Yeast Rice Extract 600 MG CAPS, Take 600 mg by mouth 2 (two) times daily. .  tizanidine (ZANAFLEX) 2 MG capsule, Take 1 capsule (2 mg total) by mouth at bedtime as needed for muscle spasms. Marland Kitchen  triamcinolone cream (KENALOG) 0.5 %, Apply 1 application topically 2 (two) times daily. To affected areas. .  TURMERIC PO, Take 500 mg by mouth 2 (two) times daily. .  Vitamin D, Ergocalciferol, (DRISDOL) 1.25 MG (50000 UT) CAPS capsule, TAKE 1 CAPSULE BY MOUTH EVERY 7 DAYS  Current Facility-Administered Medications (Other):  .  0.9 %  sodium chloride infusion   Reviewed prior external information including notes and imaging from   primary care provider As well as notes that were available from care everywhere and other healthcare systems.  Past medical history, social, surgical and family history all reviewed in electronic medical record.  No pertanent information unless stated regarding to the chief complaint.   Review of Systems:  No headache, visual changes, nausea, vomiting, diarrhea, constipation, dizziness, abdominal pain, skin rash, fevers, chills, night sweats, weight loss, swollen lymph nodes,  chest pain, shortness of breath, mood changes. POSITIVE muscle aches, body aches, joint swelling  Objective  Blood pressure (!) 160/86, pulse 63, height 5\' 5"  (1.651 m), weight 143 lb (64.9 kg), SpO2 96 %.   General:  No apparent distress alert and oriented x3 mood and affect normal, dressed appropriately.  HEENT: Pupils asymmetry and cloudiness noted Patient has significant arthritic changes of multiple joints.  Tender to palpation in the paraspinal musculature lumbar spine.  Pain in the piriformis as well noted.  Significant tightness noted in the area.  Negative straight leg test but unable to do [place]y Sox secondary to stiffness and pain  After verbal consent patient was prepped with alcohol swab and with a 21-gauge 3 inch needle patient was injected with 0.5 cc of 0.5% Marcaine and 1 cc of 40 mg/mL of triamcinolone into the right piriformis tendon sheath.  No blood loss.  Postinjection instructions given    Impression and Recommendations:     The above documentation has been reviewed and is accurate and complete Lyndal Pulley, DO       Note: This dictation was prepared with Dragon dictation along with smaller phrase technology. Any transcriptional errors that result from this process are unintentional.

## 2019-08-29 NOTE — Patient Instructions (Addendum)
Good to see you See me at next appointment

## 2019-09-22 ENCOUNTER — Other Ambulatory Visit: Payer: Self-pay | Admitting: Internal Medicine

## 2019-09-22 ENCOUNTER — Other Ambulatory Visit: Payer: Self-pay

## 2019-09-22 ENCOUNTER — Ambulatory Visit
Admission: RE | Admit: 2019-09-22 | Discharge: 2019-09-22 | Disposition: A | Discharge status: Home / Self Care | Payer: Medicare PPO | Source: Ambulatory Visit | Attending: Internal Medicine | Admitting: Internal Medicine

## 2019-09-22 DIAGNOSIS — M5440 Lumbago with sciatica, unspecified side: Secondary | ICD-10-CM

## 2019-09-22 DIAGNOSIS — M4186 Other forms of scoliosis, lumbar region: Secondary | ICD-10-CM | POA: Diagnosis not present

## 2019-09-22 DIAGNOSIS — M4316 Spondylolisthesis, lumbar region: Secondary | ICD-10-CM | POA: Diagnosis not present

## 2019-09-22 DIAGNOSIS — S22080A Wedge compression fracture of T11-T12 vertebra, initial encounter for closed fracture: Secondary | ICD-10-CM | POA: Diagnosis not present

## 2019-09-22 DIAGNOSIS — M47816 Spondylosis without myelopathy or radiculopathy, lumbar region: Secondary | ICD-10-CM | POA: Diagnosis not present

## 2019-09-22 DIAGNOSIS — M545 Low back pain: Secondary | ICD-10-CM | POA: Diagnosis not present

## 2019-10-14 ENCOUNTER — Other Ambulatory Visit: Payer: Self-pay | Admitting: Family Medicine

## 2019-10-14 ENCOUNTER — Other Ambulatory Visit: Payer: Self-pay

## 2019-10-14 ENCOUNTER — Encounter: Payer: Self-pay | Admitting: Family Medicine

## 2019-10-14 ENCOUNTER — Ambulatory Visit (INDEPENDENT_AMBULATORY_CARE_PROVIDER_SITE_OTHER): Payer: Medicare PPO | Admitting: Family Medicine

## 2019-10-14 ENCOUNTER — Ambulatory Visit: Payer: Medicare PPO | Admitting: Family Medicine

## 2019-10-14 VITALS — BP 118/72 | HR 67 | Ht 65.0 in | Wt 141.0 lb

## 2019-10-14 DIAGNOSIS — M546 Pain in thoracic spine: Secondary | ICD-10-CM

## 2019-10-14 DIAGNOSIS — S22080A Wedge compression fracture of T11-T12 vertebra, initial encounter for closed fracture: Secondary | ICD-10-CM

## 2019-10-14 MED ORDER — CALCITONIN (SALMON) 200 UNIT/ACT NA SOLN: 1.0000 | Freq: Every day | NASAL | 0 refills | Status: DC

## 2019-10-14 NOTE — Progress Notes (Signed)
[place] Phone: [phone] Subjective:    I'm seeing this patient by the request  of:  Wenda Low, MD  CC: Back pain follow-up  [id number]   08/29/2019 Chronic problem with worsening symptoms.  Due to some patient's other comorbidities unable to do many other treatment options.  We will continue the gabapentin.  Consider the possibility of epidurals in the back if this continues.  Discussed icing regimen and home exercises.  Follow-up again in 4 to 8 weeks.  Update 10/14/2019 Sarah Castro is a 79 y.o. female coming in with complaint of right side piriformis syndrome. Fractured back when she was on a trip in [place] 3 weeks ago. Saw PCP who did xrays. T12 fracture. Using Tylenol and Advil. Was using Robaxin but stopped due to constipation.   Xray 09/22/2019 IMPRESSION: 1.  Prominent T12 compression fracture noted on today's exam.  2. Lumbar spine scoliosis concave right and diffuse multilevel degenerative change again noted. 10 mm anterolisthesis L4 on L5 again noted.  3.  Aortoiliac atherosclerotic vascular disease.     Past Medical History:  Diagnosis Date  . Arthritis   . Hypercholesteremia   . Hypertension   . PONV (postoperative nausea and vomiting)    Past Surgical History:  Procedure Laterality Date  . BUNIONECTOMY  2013  . CATARACT EXTRACTION     right  . [place]  2013  . HEMATOMA EVACUATION  5 years ago  . KNEE ARTHROSCOPY     right  . LAPAROSCOPIC APPENDECTOMY N/A 10/24/2012   Procedure: APPENDECTOMY LAPAROSCOPIC;  Surgeon: Rolm Bookbinder, MD;  Location: [facility];  Service: General;  Laterality: N/A;  . LAPAROSCOPIC APPENDECTOMY    . POSTERIOR TIBIAL TENDON REPAIR  1998  . TONSILLECTOMY   age 46  . 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 Marital status: Married    Spouse name: Not on file  . Number of children: Not on file  . Years of education: Not on file  . Highest education level: Not on file  Occupational History  . Not on file  Tobacco Use  . Smoking status: Former Smoker    Packs/day: 0.50    Years: 20.00    Pack years: 10.00    Types: Cigarettes    Quit date: 02/07/2011    Years since quitting: 8.6  . Smokeless tobacco: Never Used  Vaping Use  . Vaping Use: Never used  Substance and Sexual Activity  . Alcohol use: Yes    Alcohol/week: 7.0 standard drinks    Types: 7 Standard drinks or equivalent per week  . Drug use: No  . Sexual activity: Not on file  Other Topics Concern  . Not on file  Social History Narrative  . Not on file   Social Determinants of Health   Financial Resource Strain:   . Difficulty of Paying Living Expenses: Not on file  Food Insecurity:   . Worried About Charity fundraiser in the Last Year: Not on file  . Ran Out of Food in the Last Year: Not on file  Transportation Needs:   . Lack of Transportation (Medical): Not on file  . Lack of Transportation (Non-Medical): Not on file  Physical Activity:   . Days of Exercise per Week:  Not on file  . Minutes of Exercise per Session: Not on file  Stress:   . Feeling of Stress : Not on file  Social Connections:   . Frequency of Communication with Friends and Family: Not on file  . Frequency of Social Gatherings with Friends and Family: Not on file  . Attends Religious Services: Not on file  . Active Member of Clubs or Organizations: Not on file  . Attends Archivist Meetings: Not on file  . Marital Status: Not on file   Allergies  Allergen Reactions  . Sulfa Antibiotics Rash   Family History  Problem Relation Age of Onset  . Cancer Father        throat  . Cancer Sister        non hodgkins lymphoma  . Colon cancer Maternal Grandmother 54    Current  Outpatient Medications (Endocrine & Metabolic):  .  predniSONE (DELTASONE) 20 MG tablet, Take 2 tablets (40 mg total) by mouth daily with breakfast. .  calcitonin, salmon, (MIACALCIN/FORTICAL) 200 UNIT/ACT nasal spray, PLACE 1 SPRAY INTO ALTERNATE NOSTRILS DAILY   Current Outpatient Medications (Cardiovascular):  .  amlodipine-atorvastatin (CADUET) 2.5-10 MG tablet, Take 1 tablet by mouth daily. .  hydrochlorothiazide (HYDRODIURIL) 25 MG tablet, Take 25 mg by mouth every morning. .  lovastatin (MEVACOR) 40 MG tablet, Take 20 mg by mouth at bedtime. Marland Kitchen  olmesartan (BENICAR) 40 MG tablet, Take 40 mg by mouth daily.         Current Outpatient Medications (Other):  Marland Kitchen  Calcium Carbonate-Vitamin D (CALCIUM 600 + D PO), Take 1 tablet by mouth 2 (two) times daily. .  cholecalciferol (VITAMIN D) 400 UNITS TABS, Take 400-800 Units by mouth 2 (two) times daily. Pt takes 400 mg at night. 800 mg qam .  diazepam (VALIUM) 5 MG tablet, Take 1 tablet 30 minutes before procedure. .  fish oil-omega-3 fatty acids 1000 MG capsule, Take 2 g by mouth 2 (two) times daily. Marland Kitchen  gabapentin (NEURONTIN) 100 MG capsule, Take 1 capsule (100 mg total) by mouth at bedtime. .  Multiple Vitamins-Minerals (MULTIVITAMIN WITH MINERALS) tablet, Take 1 tablet by mouth daily. .  Multiple Vitamins-Minerals (OCUVITE PRESERVISION PO), Take 2 capsules by mouth daily. .  Red Yeast Rice Extract 600 MG CAPS, Take 600 mg by mouth 2 (two) times daily. .  tizanidine (ZANAFLEX) 2 MG capsule, Take 1 capsule (2 mg total) by mouth at bedtime as needed for muscle spasms. Marland Kitchen  triamcinolone cream (KENALOG) 0.5 %, Apply 1 application topically 2 (two) times daily. To affected areas. .  TURMERIC PO, Take 500 mg by mouth 2 (two) times daily. .  Vitamin D, Ergocalciferol, (DRISDOL) 1.25 MG (50000 UT) CAPS capsule, TAKE 1 CAPSULE BY MOUTH EVERY 7 DAYS  Current Facility-Administered Medications (Other):  .  0.9 %  sodium chloride  infusion   Reviewed prior external information including notes and imaging from  primary care provider As well as notes that were available from care everywhere and other healthcare systems.  Past medical history, social, surgical and family history all reviewed in electronic medical record.  No pertanent information unless stated regarding to the chief complaint.   Review of Systems:  No headache, visual changes, nausea, vomiting, diarrhea, constipation, dizziness, abdominal pain, skin rash, fevers, chills, night sweats, weight loss, swollen lymph nodes, joint swelling, chest pain, shortness of breath, mood changes. POSITIVE muscle aches, body aches  Objective  Blood pressure 118/72, pulse 67, height 5\' 5"  (1.651 m),  weight 141 lb (64 kg), SpO2 96 %.   General: No apparent distress alert and oriented x3 patient does not appear to be uncomfortable with sitting long amount of time Respiratory: Patient's speak in full sentences and does not appear short of breath  Antalgic gait noted. Back exam severely tender in the thoracolumbar juncture and does seem to be midline.  No bruising noted.  No CVA tenderness noted but does have tightness of the musculature.  Neurovascularly intact    Impression and Recommendations:     The above documentation has been reviewed and is accurate and complete Lyndal Pulley, DO       Note: This dictation was prepared with Dragon dictation along with smaller phrase technology. Any transcriptional errors that result from this process are unintentional.

## 2019-10-14 NOTE — Patient Instructions (Signed)
Calcium 2x daily while you are on calcitonin nose spray which you will use 3 x a week See me again in 3-4 weeks

## 2019-10-14 NOTE — Assessment & Plan Note (Signed)
Patient does have more of a compression fracture noted.  Unfortunately patient is now greater than 3 weeks out from his fall.  Do not think she would be a candidate for kyphoplasty at this moment.  Discussed with patient about the possibility of a CT scan but I do feel that repeat x-ray would not be helpful at the moment.  We discussed different treatment options including narcotics but patient is already a fairly significant fall risk.  We discussed that short-term use of calcitonin.  Patient's last calcium was 8.8 encourage her to take it daily while she is on calcitonin and use calcitonin every other day for the next month.  Patient will follow up in 2 to 3 weeks where we will get x-rays.  Worsening pain patient will come again sooner

## 2019-10-27 ENCOUNTER — Ambulatory Visit: Payer: Medicare PPO | Admitting: Family Medicine

## 2019-11-06 NOTE — Progress Notes (Addendum)
[place] Phone: [phone] Subjective:   Sarah Castro, am serving as a scribe for Dr. Hulan Saas. This visit occurred during the SARS-CoV-2 public health emergency.  Safety protocols were in place, including screening questions prior to the visit, additional usage of staff PPE, and extensive cleaning of exam room while observing appropriate contact time as indicated for disinfecting solutions.   I'm seeing this patient by the request  of:  Wenda Low, MD  CC: Back pain follow-up, left knee pain  LDJ:[id number]   10/14/2019 Patient does have more of a compression fracture noted.  Unfortunately patient is now greater than 3 weeks out from his fall.  Do not think she would be a candidate for kyphoplasty at this moment.  Discussed with patient about the possibility of a CT scan but I do feel that repeat x-ray would not be helpful at the moment.  We discussed different treatment options including narcotics but patient is already a fairly significant fall risk.  We discussed that short-term use of calcitonin.  Patient's last calcium was 8.8 encourage her to take it daily while she is on calcitonin and use calcitonin every other day for the next month.  Patient will follow up in 2 to 3 weeks where we will get x-rays.  Worsening pain patient will come again sooner      Update 11/07/2019 Sarah Castro is a 79 y.o. female coming in with complaint of T12 compression fracture. Her pain has improved but she has to sleep in a recliner due to pain. Pain increases with standing more than 10 minutes.  Patient states that she is unable to do things such as standing for long amount of time, has not noticed significant improvement with the calcitonin that she is doing 3 times a week.  Patient did have x-rays taken.  Shows that patient's T12 compression fracture may have gotten somewhat worse.  Patient's osteopenic changes are  severe.   Patient also has left knee pain.  Known arthritic changes, feels like she is starting to compensate.  Putting more pressure on it, but not severely tender but is concerned that it will get worse.    Past Medical History:  Diagnosis Date  . Arthritis   . Hypercholesteremia   . Hypertension   . PONV (postoperative nausea and vomiting)    Past Surgical History:  Procedure Laterality Date  . BUNIONECTOMY  2013  . CATARACT EXTRACTION     right  . [place]  2013  . HEMATOMA EVACUATION  5 years ago  . KNEE ARTHROSCOPY     right  . LAPAROSCOPIC APPENDECTOMY N/A 10/24/2012   Procedure: APPENDECTOMY LAPAROSCOPIC;  Surgeon: Rolm Bookbinder, MD;  Location: [facility];  Service: General;  Laterality: N/A;  . LAPAROSCOPIC APPENDECTOMY    . POSTERIOR TIBIAL TENDON REPAIR  1998  . TONSILLECTOMY   age 5  . 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 Marital status: Married    Spouse name: Not on file  . Number of children: Not on file  . Years of education: Not on file  . Highest education level: Not on file  Occupational History  . Not on file  Tobacco Use  . Smoking status: Former Smoker    Packs/day: 0.50  Years: 20.00    Pack years: 10.00    Types: Cigarettes    Quit date: 02/07/2011    Years since quitting: 8.7  . Smokeless tobacco: Never Used  Vaping Use  . Vaping Use: Never used  Substance and Sexual Activity  . Alcohol use: Yes    Alcohol/week: 7.0 standard drinks    Types: 7 Standard drinks or equivalent per week  . Drug use: Castro  . Sexual activity: Not on file  Other Topics Concern  . Not on file  Social History Narrative  . Not on file   Social Determinants of Health   Financial Resource Strain:   . Difficulty of Paying Living Expenses: Not on file  Food Insecurity:   .  Worried About Charity fundraiser in the Last Year: Not on file  . Ran Out of Food in the Last Year: Not on file  Transportation Needs:   . Lack of Transportation (Medical): Not on file  . Lack of Transportation (Non-Medical): Not on file  Physical Activity:   . Days of Exercise per Week: Not on file  . Minutes of Exercise per Session: Not on file  Stress:   . Feeling of Stress : Not on file  Social Connections:   . Frequency of Communication with Friends and Family: Not on file  . Frequency of Social Gatherings with Friends and Family: Not on file  . Attends Religious Services: Not on file  . Active Member of Clubs or Organizations: Not on file  . Attends Archivist Meetings: Not on file  . Marital Status: Not on file   Allergies  Allergen Reactions  . Sulfa Antibiotics Rash   Family History  Problem Relation Age of Onset  . Cancer Father        throat  . Cancer Sister        non hodgkins lymphoma  . Colon cancer Maternal Grandmother 23    Current Outpatient Medications (Endocrine & Metabolic):  .  calcitonin, salmon, (MIACALCIN/FORTICAL) 200 UNIT/ACT nasal spray, PLACE 1 SPRAY INTO ALTERNATE NOSTRILS DAILY .  predniSONE (DELTASONE) 20 MG tablet, Take 2 tablets (40 mg total) by mouth daily with breakfast.   Current Outpatient Medications (Cardiovascular):  .  amlodipine-atorvastatin (CADUET) 2.5-10 MG tablet, Take 1 tablet by mouth daily. .  hydrochlorothiazide (HYDRODIURIL) 25 MG tablet, Take 25 mg by mouth every morning. .  lovastatin (MEVACOR) 40 MG tablet, Take 20 mg by mouth at bedtime. Marland Kitchen  olmesartan (BENICAR) 40 MG tablet, Take 40 mg by mouth daily.         Current Outpatient Medications (Other):  Marland Kitchen  Calcium Carbonate-Vitamin D (CALCIUM 600 + D PO), Take 1 tablet by mouth 2 (two) times daily. .  cholecalciferol (VITAMIN D) 400 UNITS TABS, Take 400-800 Units by mouth 2 (two) times daily. Pt takes 400 mg at night. 800 mg qam .  diazepam (VALIUM) 5 MG  tablet, Take 1 tablet 30 minutes before procedure. .  fish oil-omega-3 fatty acids 1000 MG capsule, Take 2 g by mouth 2 (two) times daily. Marland Kitchen  gabapentin (NEURONTIN) 100 MG capsule, Take 1 capsule (100 mg total) by mouth at bedtime. .  Multiple Vitamins-Minerals (MULTIVITAMIN WITH MINERALS) tablet, Take 1 tablet by mouth daily. .  Multiple Vitamins-Minerals (OCUVITE PRESERVISION PO), Take 2 capsules by mouth daily. .  Red Yeast Rice Extract 600 MG CAPS, Take 600 mg by mouth 2 (two) times daily. .  tizanidine (ZANAFLEX) 2 MG capsule, Take 1 capsule (2  mg total) by mouth at bedtime as needed for muscle spasms. Marland Kitchen  triamcinolone cream (KENALOG) 0.5 %, Apply 1 application topically 2 (two) times daily. To affected areas. .  TURMERIC PO, Take 500 mg by mouth 2 (two) times daily. .  Vitamin D, Ergocalciferol, (DRISDOL) 1.25 MG (50000 UT) CAPS capsule, TAKE 1 CAPSULE BY MOUTH EVERY 7 DAYS  Current Facility-Administered Medications (Other):  .  0.9 %  sodium chloride infusion   Reviewed prior external information including notes and imaging from  primary care provider As well as notes that were available from care everywhere and other healthcare systems.  Past medical history, social, surgical and family history all reviewed in electronic medical record.  Castro pertanent information unless stated regarding to the chief complaint.   Review of Systems:  Castro headache, visual changes, nausea, vomiting, diarrhea, constipation, dizziness, abdominal pain, skin rash, fevers, chills, night sweats, weight loss, swollen lymph nodes,  joint swelling, chest pain, shortness of breath, mood changes. POSITIVE muscle aches, body aches  Objective  Blood pressure 122/74, pulse 82, height 5\' 5"  (1.651 m), weight 140 lb (63.5 kg), SpO2 98 %.   General: Castro apparent distress alert and oriented x3 mood and affect normal, dressed appropriately.  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  Does have some increase in kyphosis of the upper back and loss of lordosis of the lumbar spine.  Did not do significant amount of strength testing but appears to be symmetric bilaterally and neurovascularly intact distally.  Knee: Left valgus deformity noted.  Abnormal thigh to calf ratio.  Tender to palpation over medial and PF joint line.  ROM full in flexion and extension and lower leg rotation. instability with valgus force.  painful patellar compression. Patellar glide with moderate crepitus.  After informed written and verbal consent, patient was seated on exam table. Left knee was prepped with alcohol swab and utilizing anterolateral approach, patient's left knee space was injected with 4:1  marcaine 0.5%: Kenalog 40mg /dL. Patient tolerated the procedure well without immediate complications.      Impression and Recommendations:     The above documentation has been reviewed and is accurate and complete Lyndal Pulley, DO

## 2019-11-07 ENCOUNTER — Ambulatory Visit: Payer: Medicare PPO

## 2019-11-07 ENCOUNTER — Other Ambulatory Visit: Payer: Self-pay

## 2019-11-07 ENCOUNTER — Encounter: Payer: Self-pay | Admitting: Family Medicine

## 2019-11-07 ENCOUNTER — Ambulatory Visit
Admission: RE | Admit: 2019-11-07 | Discharge: 2019-11-07 | Disposition: A | Discharge status: Home / Self Care | Payer: Medicare PPO | Source: Ambulatory Visit | Attending: Family Medicine | Admitting: Family Medicine

## 2019-11-07 ENCOUNTER — Ambulatory Visit (INDEPENDENT_AMBULATORY_CARE_PROVIDER_SITE_OTHER): Payer: Medicare PPO | Admitting: Family Medicine

## 2019-11-07 VITALS — BP 122/74 | HR 82 | Ht 65.0 in | Wt 140.0 lb

## 2019-11-07 DIAGNOSIS — M545 Low back pain, unspecified: Secondary | ICD-10-CM

## 2019-11-07 DIAGNOSIS — M546 Pain in thoracic spine: Secondary | ICD-10-CM | POA: Diagnosis not present

## 2019-11-07 DIAGNOSIS — M1712 Unilateral primary osteoarthritis, left knee: Secondary | ICD-10-CM | POA: Diagnosis not present

## 2019-11-07 DIAGNOSIS — S22080G Wedge compression fracture of T11-T12 vertebra, subsequent encounter for fracture with delayed healing: Secondary | ICD-10-CM

## 2019-11-07 MED ORDER — KETOROLAC TROMETHAMINE 30 MG/ML IJ SOLN
30.0000 mg | Freq: Once | INTRAMUSCULAR | Status: AC
  Administered 2019-11-07: 30 mg via INTRAMUSCULAR

## 2019-11-07 NOTE — Assessment & Plan Note (Signed)
Compression fracture with patient not responding well to the conservative therapy.  History of a spinal stenosis.  CT scanning of the thoracic and lumbar spine ordered today to further evaluate.  Patient will continue with calcitonin 3 times a week secondary to the hypercalcemia.  Will send message to primary care provider to discuss the potential for osteoporotic medications or infusions.  Patient is following up with him next month as well.  Follow-up with me after imaging to discuss different treatment options.

## 2019-11-07 NOTE — Patient Instructions (Addendum)
CT thoracic and lumbar- [facility] [phone] Will speak with your PCP Given shot of Toradol Injected knee today We will contact you after CT scan

## 2019-11-07 NOTE — Assessment & Plan Note (Signed)
Repeat injection given today.  Tolerated the procedure well, discussed icing regimen and home exercise, topical anti-inflammatories.  Patient could be a candidate for viscosupplementation but steroids every 10 to 12 weeks seems to be helping.

## 2019-11-10 ENCOUNTER — Other Ambulatory Visit: Payer: Self-pay

## 2019-11-10 ENCOUNTER — Ambulatory Visit (INDEPENDENT_AMBULATORY_CARE_PROVIDER_SITE_OTHER): Payer: Medicare PPO

## 2019-11-10 DIAGNOSIS — M545 Low back pain, unspecified: Secondary | ICD-10-CM

## 2019-11-10 NOTE — Progress Notes (Unsigned)
D lumba

## 2019-11-21 ENCOUNTER — Ambulatory Visit
Admission: RE | Admit: 2019-11-21 | Discharge: 2019-11-21 | Disposition: A | Discharge status: Home / Self Care | Payer: Medicare PPO | Source: Ambulatory Visit | Attending: Family Medicine | Admitting: Family Medicine

## 2019-11-21 DIAGNOSIS — M4319 Spondylolisthesis, multiple sites in spine: Secondary | ICD-10-CM | POA: Diagnosis not present

## 2019-11-21 DIAGNOSIS — M546 Pain in thoracic spine: Secondary | ICD-10-CM

## 2019-11-21 DIAGNOSIS — S22080A Wedge compression fracture of T11-T12 vertebra, initial encounter for closed fracture: Secondary | ICD-10-CM | POA: Diagnosis not present

## 2019-11-21 DIAGNOSIS — M545 Low back pain, unspecified: Secondary | ICD-10-CM | POA: Diagnosis not present

## 2019-11-21 DIAGNOSIS — S22070A Wedge compression fracture of T9-T10 vertebra, initial encounter for closed fracture: Secondary | ICD-10-CM | POA: Diagnosis not present

## 2019-11-21 DIAGNOSIS — M5124 Other intervertebral disc displacement, thoracic region: Secondary | ICD-10-CM | POA: Diagnosis not present

## 2019-11-25 ENCOUNTER — Other Ambulatory Visit: Payer: Self-pay

## 2019-11-25 DIAGNOSIS — S22080G Wedge compression fracture of T11-T12 vertebra, subsequent encounter for fracture with delayed healing: Secondary | ICD-10-CM

## 2019-11-27 ENCOUNTER — Other Ambulatory Visit: Payer: Self-pay | Admitting: Family Medicine

## 2019-11-27 DIAGNOSIS — S22000A Wedge compression fracture of unspecified thoracic vertebra, initial encounter for closed fracture: Secondary | ICD-10-CM

## 2019-11-28 DIAGNOSIS — E782 Mixed hyperlipidemia: Secondary | ICD-10-CM | POA: Diagnosis not present

## 2019-11-28 DIAGNOSIS — I10 Essential (primary) hypertension: Secondary | ICD-10-CM | POA: Diagnosis not present

## 2019-11-28 DIAGNOSIS — F411 Generalized anxiety disorder: Secondary | ICD-10-CM | POA: Diagnosis not present

## 2019-11-28 DIAGNOSIS — M8588 Other specified disorders of bone density and structure, other site: Secondary | ICD-10-CM | POA: Diagnosis not present

## 2019-11-28 DIAGNOSIS — Z0000 Encounter for general adult medical examination without abnormal findings: Secondary | ICD-10-CM | POA: Diagnosis not present

## 2019-11-28 DIAGNOSIS — S22000A Wedge compression fracture of unspecified thoracic vertebra, initial encounter for closed fracture: Secondary | ICD-10-CM | POA: Diagnosis not present

## 2019-11-28 DIAGNOSIS — E559 Vitamin D deficiency, unspecified: Secondary | ICD-10-CM | POA: Diagnosis not present

## 2019-11-28 DIAGNOSIS — S32010G Wedge compression fracture of first lumbar vertebra, subsequent encounter for fracture with delayed healing: Secondary | ICD-10-CM | POA: Diagnosis not present

## 2019-11-28 DIAGNOSIS — R7309 Other abnormal glucose: Secondary | ICD-10-CM | POA: Diagnosis not present

## 2019-11-28 DIAGNOSIS — Z1389 Encounter for screening for other disorder: Secondary | ICD-10-CM | POA: Diagnosis not present

## 2019-12-02 ENCOUNTER — Other Ambulatory Visit: Payer: Medicare PPO

## 2019-12-04 ENCOUNTER — Ambulatory Visit
Admission: RE | Admit: 2019-12-04 | Discharge: 2019-12-04 | Disposition: A | Discharge status: Home / Self Care | Payer: Medicare PPO | Source: Ambulatory Visit | Attending: Family Medicine | Admitting: Family Medicine

## 2019-12-04 ENCOUNTER — Encounter: Payer: Self-pay | Admitting: *Deleted

## 2019-12-04 DIAGNOSIS — S22080A Wedge compression fracture of T11-T12 vertebra, initial encounter for closed fracture: Secondary | ICD-10-CM | POA: Diagnosis not present

## 2019-12-04 DIAGNOSIS — S22000A Wedge compression fracture of unspecified thoracic vertebra, initial encounter for closed fracture: Secondary | ICD-10-CM

## 2019-12-04 HISTORY — PX: IR RADIOLOGIST EVAL & MGMT: IMG5224

## 2019-12-04 NOTE — Consult Note (Signed)
Chief Complaint: Back pain  Referring Physician(s): Smith,Zachary M  History of Present Illness: Sarah Castro is a 79 y.o. female presenting today to [facility], kindly referred by Dr. Tamala Julian, for evaluation of back pain related to compression fracture, and possible candidacy for vertebral augmentation.   Ms Hilger is here today with her daughter, [name], for the interview.   She tells me that she had a fall in mid August at her son's house in [place], that resulted in acute, severe back pain.  She describes this as 8-9/10 intensity, which was debilitating.  When she was at her son's house, she took some pain medication, that she says made her feel altered and confused.  She drove home to [place] with her daughter, during the trip which she remembers as very uncomfortable and painful.  She started a prescription with some "narcotic medication" which caused constipation and caused her to feel woozy and unsteady.   Since the original fall, she has had considerable decreased symptoms of pain.  Today, she tells me she "feels fine" with 0/10 pain at the moment.   She uses a cane for ambulation, which she has been using for a long time.  She tells me that except for bending over to reach the bottom level of the dish washer, she is able to complete all of her ADL's.  She is dressing, bathing, and caring for herself.  She goes to the bathroom without assistance.  She is able to walk with the cane in her usual manner.   She says she is now taking ibuprofen maybe every other day as needed.   She has been living with her daughter since the fall, but she confirms that she has not needed her to help with additional care.   She has been sleeping in a recliner chair, as laying flat in a bed is uncomfortable.    CT imaging of the thoracic and lumbar spine performed 11/21/19:  T10, T11 compression fracture without significant height loss. T12 and L1 compression fractures with 50% or greater  height loss.  There is small posterior fragment retropulsion at the superior posterior endplate of both W29 and L1.    She denies any incontinence or lower extremity weakness.       Past Medical History:  Diagnosis Date  . Arthritis   . Hypercholesteremia   . Hypertension   . PONV (postoperative nausea and vomiting)     Past Surgical History:  Procedure Laterality Date  . BUNIONECTOMY  2013  . CATARACT EXTRACTION     right  . [facility]  2013  . HEMATOMA EVACUATION  5 years ago  . KNEE ARTHROSCOPY     right  . LAPAROSCOPIC APPENDECTOMY N/A 10/24/2012   Procedure: APPENDECTOMY LAPAROSCOPIC;  Surgeon: Rolm Bookbinder, MD;  Location: [facility];  Service: General;  Laterality: N/A;  . LAPAROSCOPIC APPENDECTOMY    . POSTERIOR TIBIAL TENDON REPAIR  1998  . TONSILLECTOMY   age 44  . 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 Authorizing Provider  amlodipine-atorvastatin (CADUET) 2.5-10 MG tablet Take 1 tablet by mouth daily.    [provider]  calcitonin, salmon, (MIACALCIN/FORTICAL) 200 UNIT/ACT nasal spray PLACE 1 SPRAY INTO ALTERNATE NOSTRILS DAILY 10/14/19  Lyndal Pulley, DO  Calcium Carbonate-Vitamin D (CALCIUM 600 + D PO) Take 1 tablet by mouth 2 (two) times daily.    [provider]  cholecalciferol (VITAMIN D) 400 UNITS TABS Take 400-800 Units by mouth 2 (two) times daily. Pt takes 400 mg at night. 800 mg qam    [provider]  diazepam (VALIUM) 5 MG tablet Take 1 tablet 30 minutes before procedure. 08/02/17   Lyndal Pulley, DO  fish oil-omega-3 fatty acids 1000 MG capsule Take 2 g by mouth 2 (two) times daily.    [provider]  gabapentin (NEURONTIN) 100 MG capsule Take 1 capsule  (100 mg total) by mouth at bedtime. 04/22/19   Lyndal Pulley, DO  hydrochlorothiazide (HYDRODIURIL) 25 MG tablet Take 25 mg by mouth every morning.    [provider]  lovastatin (MEVACOR) 40 MG tablet Take 20 mg by mouth at bedtime.    [provider]  Multiple Vitamins-Minerals (MULTIVITAMIN WITH MINERALS) tablet Take 1 tablet by mouth daily.    [provider]  Multiple Vitamins-Minerals (OCUVITE PRESERVISION PO) Take 2 capsules by mouth daily.    [provider]  olmesartan (BENICAR) 40 MG tablet Take 40 mg by mouth daily.    [provider]  predniSONE (DELTASONE) 20 MG tablet Take 2 tablets (40 mg total) by mouth daily with breakfast. 08/23/17   Lyndal Pulley, DO  Red Yeast Rice Extract 600 MG CAPS Take 600 mg by mouth 2 (two) times daily.    [provider]  tizanidine (ZANAFLEX) 2 MG capsule Take 1 capsule (2 mg total) by mouth at bedtime as needed for muscle spasms. 12/04/18   Lyndal Pulley, DO  triamcinolone cream (KENALOG) 0.5 % Apply 1 application topically 2 (two) times daily. To affected areas. 06/12/18   Lyndal Pulley, DO  TURMERIC PO Take 500 mg by mouth 2 (two) times daily.    [provider]  Vitamin D, Ergocalciferol, (DRISDOL) 1.25 MG (50000 UT) CAPS capsule TAKE 1 CAPSULE BY MOUTH EVERY 7 DAYS 09/10/18   Lyndal Pulley, DO     Family History  Problem Relation Age of Onset  . Cancer Father        throat  . Cancer Sister        non hodgkins lymphoma  . Colon cancer Maternal Grandmother 21    Social History   Socioeconomic History  . Marital status: Married    Spouse name: Not on file  . Number of children: Not on file  . Years of education: Not on file  . Highest education level: Not on file  Occupational History  . Not on file  Tobacco Use  . Smoking status: Former Smoker    Packs/day: 0.50    Years: 20.00    Pack years: 10.00    Types: Cigarettes    Quit date: 02/07/2011    Years since  quitting: 8.8  . Smokeless tobacco: Never Used  Vaping Use  . Vaping Use: Never used  Substance and Sexual Activity  . Alcohol use: Yes    Alcohol/week: 7.0 standard drinks    Types: 7 Standard drinks or equivalent per week  . Drug use: No  . Sexual activity: Not on file  Other Topics Concern  . Not on file  Social History Narrative  . Not on file   Social Determinants of Health   Financial Resource Strain:   . Difficulty of Paying Living Expenses: Not on file  Food Insecurity:   . Worried About Charity fundraiser in the Last Year: Not on file  . Ran Out of Food in the Last Year: Not on file  Transportation Needs:   . Lack of Transportation (Medical): Not on file  . Lack of Transportation (Non-Medical): Not on file  Physical Activity:   . Days of Exercise per Week: Not on file  . Minutes of Exercise per Session: Not on file  Stress:   . Feeling of Stress : Not on file  Social Connections:   . Frequency of Communication with Friends and Family: Not on file  . Frequency of Social Gatherings with Friends and Family: Not on file  . Attends Religious Services: Not on file  . Active Member of Clubs or Organizations: Not on file  . Attends Archivist Meetings: Not on file  . Marital Status: Not on file       Review of Systems: A 12 point ROS discussed and pertinent positives are indicated in the HPI above.  All other systems are negative.  Review of Systems  Vital Signs: There were no vitals taken for this visit.  Physical Exam General: 79 yo female appearing stated age.  Well-developed, well-nourished.  No distress. HEENT: Atraumatic, normocephalic.  Glasses. Right eye hazy, secondary to blind.No lesions on external ears, nose, lips, or gums.  Oral mucosa moist, pink.  Neck: Symmetric with no goiter enlargement.  Chest/Lungs:  Symmetric chest with inspiration/expiration.  No labored breathing.  Clear to auscultation with no wheezes, rhonchi, or rales.  Heart:   RRR, with no third heart sounds appreciated. No JVD appreciated.  Abdomen:  Soft, NT/ND, with + bowel sounds.   Genito-urinary: Deferred Neurologic: Alert & Oriented to person, place, and time.   Normal affect and insight.  Appropriate questions.  Moving all 4 extremities with gross sensory intact.  MSK:  Mild TTP along the midline lower thoracic spine.  Her greatest degree of TTP is to the left of midline in the lumbar region.     Mallampati Score:  2 Imaging: DG Lumbar Spine 2-3 Views  Result Date: 11/10/2019 CLINICAL DATA:  79 year old female with chronic lumbar pain. EXAM: LUMBAR SPINE - 2-3 VIEW COMPARISON:  Lumbar radiographs 09/22/2019 and earlier. Lumbar MRI 08/13/2017. FINDINGS: Weightbearing views. Extensive Aortoiliac calcified atherosclerosis. A moderate L1 compression fracture is new since August. Superimposed severe T12 compression fracture again noted, along with evidence of mild to moderate other lower thoracic compression fractures (at least T10 and T11) which are new from October 2020. Other lumbar vertebrae appear stable with chronic levoconvex scoliosis and grade 2 spondylolisthesis at L4-L5. Associated advanced chronic lower lumbar disc degeneration. Negative visible bowel gas pattern. IMPRESSION: 1. Acute to subacute L1 compression fracture with moderate loss of height. 2. Severe T12 compression fracture appears stable since August, but T10 and T11 compression fractures may be new since that time. 3. If specific therapy such as vertebroplasty is desired, Lumbar MRI without contrast or Nuclear Medicine Whole-body Bone Scan would best determine acuity and candidacy for augmentation. 4. Chronic L4 spondylolisthesis and advanced lower lumbar disc degeneration. 5.  Aortic Atherosclerosis (ICD10-I70.0). Electronically Signed   By: Genevie Ann M.D.   On: 11/10/2019 13:23   CT THORACIC SPINE WO CONTRAST  Result Date: 11/21/2019 CLINICAL DATA:  Back pain.  Fall 09/07/2019.  Evaluate for  fracture. EXAM: CT THORACIC SPINE WITHOUT CONTRAST TECHNIQUE: Multidetector CT images of the thoracic were obtained using the standard protocol without intravenous contrast. COMPARISON:  Lumbar spine radiographs 11/07/2019 and 12/04/2018. MRI lumbar spine 08/13/2017 FINDINGS: Alignment: Normal thoracic alignment. Mild dextroscoliosis at T12. 3 mm anterolisthesis C6-7 and C7-T1 related to facet degeneration. Vertebrae: Mild compression fracture superior endplate of E17 of indeterminate age. No definite fracture line. Moderate compression fracture superior endplate of E08 with displaced fracture line through the superior endplate compatible with recent fracture. There is a horizontal cleft within the vertebral body below the depressed endplate probable hematoma. Mild retropulsion of the posterior endplate into the canal superiorly without significant stenosis. Severe compression fracture T12 with progression since 12/04/2018. This fracture also appears acute or subacute retropulsion of superior endplate into the canal with approximately 25% diameter stenosis of the canal. Moderate compression fracture of L1 which also appears acute with well-defined fracture lines. Bony retropulsion of the superior endplate into the canal narrowing the lumen by approximately 50% diameter stenosis. Paraspinal and other soft tissues: Atherosclerotic aorta without aneurysm. No paraspinous mass. Mild soft tissue stranding around the fracture at T11 fracture. Disc levels: Small central calcific disc protrusion at T4-5 without significant stenosis. IMPRESSION: Mild compression fracture T10 of indeterminate age. Moderate compression fracture T11 with well-defined fracture line and intra osseous hematoma. Mild retropulsion of bone into the canal without significant stenosis. The fracture appears acute or subacute Severe compression fracture T12, favor progression of fracture which was present in 2020. Bony retropulsion with 25% diameter  stenosis of the canal Moderate compression fracture L1 which appears acute or subacute. Bony retropulsion superior endplate with 14% diameter canal stenosis. Electronically Signed   By: Franchot Gallo M.D.   On: 11/21/2019 13:21   CT LUMBAR SPINE WO CONTRAST  Result Date: 11/21/2019 CLINICAL DATA:  Fall 09/07/2019.  Back pain.  Evaluate fracture. EXAM: CT LUMBAR SPINE WITHOUT CONTRAST TECHNIQUE: Multidetector CT imaging of the lumbar spine was performed without intravenous contrast administration. Multiplanar CT image reconstructions were also generated. COMPARISON:  Lumbar spine radiographs 11/07/2019, 12/04/2018. Lumbar MRI 08/13/2017 FINDINGS: Segmentation: Normal Alignment: Slight retrolisthesis L2-3.  10 mm anterolisthesis L4-5 Vertebrae: Mild compression fracture T11 which appears acute or subacute with intra osseous hematoma. Severe compression fracture T12 similar to the recent study of 11/07/2019 progressive since 12/04/2018. Probable progression of fracture recently. Posterior retropulsion of bone superiorly with mild spinal stenosis Moderate compression fracture of L1 with well-defined fracture lines consistent with acute or subacute fracture. Bony retropulsion with 50% diameter stenosis of the spinal canal. No other lumbar fractures identified. Paraspinal and other soft tissues: Extensive atherosclerotic calcification aorta and iliacs without aortic aneurysm. No paraspinous mass. Mild paraspinous stranding around the T11 fracture. Disc levels: T12-L1: Bony retropulsion of the superior endplate of L1 with 48% diameter stenosis of the canal. L1-2: Negative for stenosis L2-3: Diffuse bulging of the disc.  No significant stenosis L3-4: Disc degeneration with mild retropulsed listhesis. Asymmetric disc degeneration on the right with spurring. Asymmetric facet degeneration on the right. Moderate spinal stenosis. Moderate subarticular stenosis on the right. L4-5: 10 mm anterolisthesis. Moderate spinal  stenosis. Advanced disc and facet degeneration severe subarticular stenosis on the right with moderate right foraminal stenosis. Moderate left subarticular stenosis. L5-S1: Diffuse disc bulging and mild facet degeneration. Moderate left foraminal encroachment. IMPRESSION: 1. Compression fractures of T11 and T12 which appear acute or subacute 2. Moderate compression fracture L1 which appears acute or subacute. Bony retropulsion into the canal with 50% diameter stenosis of the canal. No other lumbar fractures. 3. Multilevel lumbar degenerative change. Moderate spinal stenosis at L3-4 with moderate subarticular stenosis on the  right 4. 10 mm anterolisthesis L4-5 with severe subarticular stenosis on the right and moderate right foraminal stenosis. Moderate central canal stenosis. Electronically Signed   By: Franchot Gallo M.D.   On: 11/21/2019 13:21    Labs:  CBC: No results for input(s): WBC, HGB, HCT, PLT in the last 8760 hours.  COAGS: No results for input(s): INR, APTT in the last 8760 hours.  BMP: No results for input(s): NA, K, CL, CO2, GLUCOSE, BUN, CALCIUM, CREATININE, GFRNONAA, GFRAA in the last 8760 hours.  Invalid input(s): CMP  LIVER FUNCTION TESTS: No results for input(s): BILITOT, AST, ALT, ALKPHOS, PROT, ALBUMIN in the last 8760 hours.  TUMOR MARKERS: No results for input(s): AFPTM, CEA, CA199, CHROMGRNA in the last 8760 hours.  Assessment and Plan:  Ms Tuzzolino is a 79 yo female with T10, T11, T12, L1 compression fractures on CT imaging, SP fall that occurred in mid-August.   She is symptomatic from the fractures, although she seems to be improving.    Currently, she is able to complete nearly all of her ADL's, with the exception of deep bending, comfortably, without need for high doses of pain medication.    I had a lengthy discussion with her regarding the anatomy, pathophysiology, and logistics of compression fracture and the treatment options.  Discussion regarding  options included discussion of the historical surgical options, as well as conservative management and vertebral augmentation (kyphoplasty/vertebroplasty).  We discussed goals of therapy, which primarily is reducing the perceived pain, and secondarily reducing the need for pain medication (and the side effects) as well as restoring function.   Risks were discussed including: bleeding, infection, local injury, ongoing pain, embolization of glue, nerve injury/disability, need for further surgery/procedure, risk of anesthesia, cardiopulmonary collapse, death.   After our discussion, including a discussion of the potential need for MRI to identify subacute/acute edema in the adjacent levels of T10-L1, as well as a thorough discussion of her current symptoms (improving) and 0-1/10 pain level, she has elected to continue to use conservative management strategy.   I did let her know that we can always revisit with possible MRI if she worsens or does not improve and stays symptomatic.    Plan: - Conservative management strategy, with defer of vertebral augmentation for now - If she has continuing/worsening pain, we can consider MRI, and she can call us back as needed. - I have also discussed with her the need to take inventory around her living environment of fall hazards (toys, rugs, stairs, etc)    Electronically Signed: Corrie Mckusick 12/04/2019, 2:27 PM   I spent a total of  60 Minutes   in face to face in clinical consultation, greater than 50% of which was counseling/coordinating care for symptomatic compression fracture of T10-L1, possible vertebral augmentation

## 2020-01-11 ENCOUNTER — Other Ambulatory Visit: Payer: Self-pay | Admitting: Family Medicine

## 2020-01-21 ENCOUNTER — Ambulatory Visit (INDEPENDENT_AMBULATORY_CARE_PROVIDER_SITE_OTHER): Payer: Medicare PPO | Admitting: Family Medicine

## 2020-01-21 ENCOUNTER — Other Ambulatory Visit: Payer: Self-pay

## 2020-01-21 DIAGNOSIS — G5701 Lesion of sciatic nerve, right lower limb: Secondary | ICD-10-CM | POA: Diagnosis not present

## 2020-01-21 DIAGNOSIS — M1712 Unilateral primary osteoarthritis, left knee: Secondary | ICD-10-CM | POA: Diagnosis not present

## 2020-01-21 DIAGNOSIS — S22080G Wedge compression fracture of T11-T12 vertebra, subsequent encounter for fracture with delayed healing: Secondary | ICD-10-CM

## 2020-01-21 NOTE — Patient Instructions (Addendum)
Injected piriformis and left knee today Happy Holidays to you See me again in 8 weeks

## 2020-01-21 NOTE — Assessment & Plan Note (Signed)
Injection given again today.  Chronic problem with exacerbation.  Responds well to the injections.  Patient did not do as well with viscosupplementation we have discussed that previously.  Feels like the injections do help every day to 10 weeks.  Follow-up again 8 to 10 weeks

## 2020-01-21 NOTE — Assessment & Plan Note (Signed)
Patient is doing well with no compression fracture at this time.  If any worsening would consider the kyphoplasty.

## 2020-01-21 NOTE — Progress Notes (Signed)
[place] Phone: [phone] Subjective:   [name], am serving as a scribe for Dr. Hulan Saas. This visit occurred during the SARS-CoV-2 public health emergency.  Safety protocols were in place, including screening questions prior to the visit, additional usage of staff PPE, and extensive cleaning of exam room while observing appropriate contact time as indicated for disinfecting solutions.   I'm seeing this patient by the request  of:  Wenda Low, MD  CC: Knee and low back pain follow-up  [id number]   11/07/2019 Repeat injection given today.  Tolerated the procedure well, discussed icing regimen and home exercise, topical anti-inflammatories.  Patient could be a candidate for viscosupplementation but steroids every 10 to 12 weeks seems to be helping.  Update 01/21/2020 Sarah Castro is a 79 y.o. female coming in with complaint of left knee pain. Here for injection today.  Patient's left knee does have significant arthritic changes.  Started having increasing pain and swelling again.  Patient states that it causes more of pain in her back then because how she has to compensate.  Feels like the injections every 8 to 10 weeks have been beneficial  Having in right glute as well. Would like injection in this area too.  Patient has had more of a gluteal tendinitis but also a piriformis syndrome.  Patient feels like she gets more relief with the piriformis injections and then the epidurals for patient's neurogenic claudication.  Patient is on recently has had compression fractures she wants to avoid steroids in the spine at this time patient feels like she is making some progress with the compression fractures.  Did see interventional radiology and discussed kyphoplasty but they decided on conservative therapy with patient being able to compensate relatively well     Past Medical History:  Diagnosis Date   . Arthritis   . Hypercholesteremia   . Hypertension   . PONV (postoperative nausea and vomiting)    Past Surgical History:  Procedure Laterality Date  . BUNIONECTOMY  2013  . CATARACT EXTRACTION     right  . [place]  2013  . HEMATOMA EVACUATION  5 years ago  . IR RADIOLOGIST EVAL & MGMT  12/04/2019  . KNEE ARTHROSCOPY     right  . LAPAROSCOPIC APPENDECTOMY N/A 10/24/2012   Procedure: APPENDECTOMY LAPAROSCOPIC;  Surgeon: Rolm Bookbinder, MD;  Location: [facility];  Service: General;  Laterality: N/A;  . LAPAROSCOPIC APPENDECTOMY    . POSTERIOR TIBIAL TENDON REPAIR  1998  . TONSILLECTOMY   age 14  . 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 Marital status: Married    Spouse name: Not on file  . Number of children: Not on file  . Years of education: Not on file  . Highest education level: Not on file  Occupational History  . Not on file  Tobacco Use  . Smoking status: Former Smoker    Packs/day: 0.50    Years: 20.00    Pack years: 10.00    Types: Cigarettes    Quit date: 02/07/2011    Years since quitting: 8.9  . Smokeless tobacco: Never Used  Vaping Use  . Vaping Use: Never used  Substance and Sexual Activity  . Alcohol use: Yes    Alcohol/week: 7.0  standard drinks    Types: 7 Standard drinks or equivalent per week  . Drug use: No  . Sexual activity: Not on file  Other Topics Concern  . Not on file  Social History Narrative  . Not on file   Social Determinants of Health   Financial Resource Strain: Not on file  Food Insecurity: Not on file  Transportation Needs: Not on file  Physical Activity: Not on file  Stress: Not on file  Social Connections: Not on file   Allergies  Allergen Reactions  . Sulfa Antibiotics Rash   Family History  Problem Relation Age of Onset  . Cancer  Father        throat  . Cancer Sister        non hodgkins lymphoma  . Colon cancer Maternal Grandmother 1    Current Outpatient Medications (Endocrine & Metabolic):  .  calcitonin, salmon, (MIACALCIN/FORTICAL) 200 UNIT/ACT nasal spray, PLACE 1 SPRAY INTO ALTERNATE NOSTRILS DAILY .  predniSONE (DELTASONE) 20 MG tablet, Take 2 tablets (40 mg total) by mouth daily with breakfast.   Current Outpatient Medications (Cardiovascular):  .  amlodipine-atorvastatin (CADUET) 2.5-10 MG tablet, Take 1 tablet by mouth daily. .  hydrochlorothiazide (HYDRODIURIL) 25 MG tablet, Take 25 mg by mouth every morning. .  lovastatin (MEVACOR) 40 MG tablet, Take 20 mg by mouth at bedtime. Marland Kitchen  olmesartan (BENICAR) 40 MG tablet, Take 40 mg by mouth daily.         Current Outpatient Medications (Other):  Marland Kitchen  Calcium Carbonate-Vitamin D (CALCIUM 600 + D PO), Take 1 tablet by mouth 2 (two) times daily. .  cholecalciferol (VITAMIN D) 400 UNITS TABS, Take 400-800 Units by mouth 2 (two) times daily. Pt takes 400 mg at night. 800 mg qam .  diazepam (VALIUM) 5 MG tablet, Take 1 tablet 30 minutes before procedure. .  fish oil-omega-3 fatty acids 1000 MG capsule, Take 2 g by mouth 2 (two) times daily. Marland Kitchen  gabapentin (NEURONTIN) 100 MG capsule, Take 1 capsule (100 mg total) by mouth at bedtime. .  Multiple Vitamins-Minerals (MULTIVITAMIN WITH MINERALS) tablet, Take 1 tablet by mouth daily. .  Multiple Vitamins-Minerals (OCUVITE PRESERVISION PO), Take 2 capsules by mouth daily. .  Red Yeast Rice Extract 600 MG CAPS, Take 600 mg by mouth 2 (two) times daily. .  tizanidine (ZANAFLEX) 2 MG capsule, Take 1 capsule (2 mg total) by mouth at bedtime as needed for muscle spasms. Marland Kitchen  triamcinolone cream (KENALOG) 0.5 %, Apply 1 application topically 2 (two) times daily. To affected areas. .  TURMERIC PO, Take 500 mg by mouth 2 (two) times daily. .  Vitamin D, Ergocalciferol, (DRISDOL) 1.25 MG (50000 UT) CAPS capsule, TAKE 1  CAPSULE BY MOUTH EVERY 7 DAYS  Current Facility-Administered Medications (Other):  .  0.9 %  sodium chloride infusion   Reviewed prior external information including notes and imaging from  primary care provider As well as notes that were available from care everywhere and other healthcare systems.  Past medical history, social, surgical and family history all reviewed in electronic medical record.  No pertanent information unless stated regarding to the chief complaint.   Review of Systems:  No headache, visual changes, nausea, vomiting, diarrhea, constipation, dizziness, abdominal pain, skin rash, fevers, chills, night sweats, weight loss, swollen lymph nodes, body aches, joint swelling, chest pain, shortness of breath, mood changes. POSITIVE muscle aches  Objective  Blood pressure 124/62, pulse 73, height 5' 1.75" (1.568 m), weight 133 lb (60.3  kg), SpO2 96 %.   General: No apparent distress alert and oriented x3 mood and affect normal, dressed appropriately.  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  Instability with valgus and varus force.  Lacks last 5 degrees of extension the last 10 degrees of flexion.  Tender to palpation diffusely.  Trace effusion noted of the patellofemoral joint contralateral knee is replacement  Low back exam still tender to palpation diffusely.  Did not do much with motion testing.  Patient does have tightness of the [facility] test on the right and severe tenderness in the piriformis.  After informed written and verbal consent, patient was seated on exam table. Left knee was prepped with alcohol swab and utilizing anterolateral approach, patient's left knee space was injected with 4:1  marcaine 0.5%: Kenalog 40mg /dL. Patient tolerated the  procedure well without immediate complications.  After verbal consent patient was prepped with alcohol swab and with a 21-gauge 2 inch needle injected into the right piriformis muscle with a total of 0.5 cc of 0.5% Marcaine and 1 cc of Kenalog 40 mg/mL.  No blood loss.  Band-Aid placed.  Patient was able to ambulate and had full strength of the leg afterwards.  Postinjection instructions given    Impression and Recommendations:     The above documentation has been reviewed and is accurate and complete Lyndal Pulley, DO

## 2020-01-21 NOTE — Assessment & Plan Note (Signed)
Patient was given injection again today.  Does have chronic problems.  Does have lumbar radiculopathy that is in the differential is causing some of the discomfort.  Patient wants to avoid any intact the injections at this time.  Patient did have the compression fractures in the lumbar spine.  Patient will continue to be active where possible.  Unfortunately due to patient's other comorbidities we do not want to do many different medications.  Follow-up with me again 8 to 10 weeks

## 2020-01-22 ENCOUNTER — Encounter: Payer: Self-pay | Admitting: Family Medicine

## 2020-03-17 ENCOUNTER — Ambulatory Visit: Payer: Medicare PPO | Admitting: Family Medicine

## 2020-03-30 DIAGNOSIS — M5136 Other intervertebral disc degeneration, lumbar region: Secondary | ICD-10-CM | POA: Diagnosis not present

## 2020-03-30 DIAGNOSIS — I700 Atherosclerosis of aorta: Secondary | ICD-10-CM | POA: Diagnosis not present

## 2020-03-30 DIAGNOSIS — E782 Mixed hyperlipidemia: Secondary | ICD-10-CM | POA: Diagnosis not present

## 2020-03-30 DIAGNOSIS — I10 Essential (primary) hypertension: Secondary | ICD-10-CM | POA: Diagnosis not present

## 2020-03-30 DIAGNOSIS — M2040 Other hammer toe(s) (acquired), unspecified foot: Secondary | ICD-10-CM | POA: Diagnosis not present

## 2020-03-30 DIAGNOSIS — I872 Venous insufficiency (chronic) (peripheral): Secondary | ICD-10-CM | POA: Diagnosis not present

## 2020-03-30 DIAGNOSIS — R7303 Prediabetes: Secondary | ICD-10-CM | POA: Diagnosis not present

## 2020-03-30 DIAGNOSIS — B351 Tinea unguium: Secondary | ICD-10-CM | POA: Diagnosis not present

## 2020-03-30 DIAGNOSIS — F411 Generalized anxiety disorder: Secondary | ICD-10-CM | POA: Diagnosis not present

## 2020-04-08 ENCOUNTER — Ambulatory Visit (INDEPENDENT_AMBULATORY_CARE_PROVIDER_SITE_OTHER): Payer: Medicare PPO | Admitting: Sports Medicine

## 2020-04-08 ENCOUNTER — Other Ambulatory Visit: Payer: Self-pay

## 2020-04-08 ENCOUNTER — Encounter: Payer: Self-pay | Admitting: Sports Medicine

## 2020-04-08 DIAGNOSIS — B351 Tinea unguium: Secondary | ICD-10-CM

## 2020-04-08 DIAGNOSIS — M79675 Pain in left toe(s): Secondary | ICD-10-CM

## 2020-04-08 DIAGNOSIS — B359 Dermatophytosis, unspecified: Secondary | ICD-10-CM | POA: Diagnosis not present

## 2020-04-08 DIAGNOSIS — L853 Xerosis cutis: Secondary | ICD-10-CM

## 2020-04-08 DIAGNOSIS — H547 Unspecified visual loss: Secondary | ICD-10-CM | POA: Diagnosis not present

## 2020-04-08 DIAGNOSIS — I739 Peripheral vascular disease, unspecified: Secondary | ICD-10-CM

## 2020-04-08 DIAGNOSIS — M79674 Pain in right toe(s): Secondary | ICD-10-CM | POA: Diagnosis not present

## 2020-04-08 MED ORDER — TERBINAFINE HCL 1 % EX SOLN: CUTANEOUS | 2 refills | Status: DC

## 2020-04-08 MED ORDER — AMMONIUM LACTATE 12 % EX LOTN: 1.0000 "application " | TOPICAL_LOTION | CUTANEOUS | 0 refills | Status: DC | PRN

## 2020-04-08 NOTE — Progress Notes (Signed)
Subjective: Sarah Castro is a 80 y.o. female patient seen today in office with complaint of mildly painful thickened and elongated toenails; unable to trim. Patient admits to dry skin as well to bottoms of both feet and heels that is also itchy. Patient has no other pedal complaints at this time.   Patient has vision impairment and has difficulty trimming nail.  Patient is assisted by daughter at this visit.  Review of Systems  All other systems reviewed and are negative.    Patient Active Problem List   Diagnosis Date Noted  . T12 compression fracture ([place]) 10/14/2019  . Rotator cuff arthropathy of right shoulder 10/08/2018  . 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  . Displaced comminuted supracondylar fracture without intercondylar fracture of right humerus, initial encounter for closed fracture 01/10/2018  . Spinal stenosis 06/12/2016  . Neurogenic claudication ([place]) 06/12/2016  . Lumbar radiculopathy 12/08/2015  . Degenerative arthritis of left knee 10/27/2015  . Piriformis syndrome of right side 01/11/2015  . Greater trochanteric bursitis of right hip 11/17/2014  . Toenail fungus 08/04/2014  . Pes planus of both feet 08/22/2013  . Primary localized osteoarthrosis, lower leg 07/25/2013  . Expected blood loss anemia 03/19/2012  . S/P right TKA 03/18/2012    Current Outpatient Medications on File Prior to Visit  Medication Sig Dispense Refill  . amlodipine-atorvastatin (CADUET) 2.5-10 MG tablet Take 1 tablet by mouth daily.    . calcitonin, salmon, (MIACALCIN/FORTICAL) 200 UNIT/ACT nasal spray PLACE 1 SPRAY INTO ALTERNATE NOSTRILS DAILY 11.1 mL 0  . Calcium Carbonate-Vitamin D (CALCIUM 600 + D PO) Take 1 tablet by mouth 2 (two) times daily.    . cholecalciferol (VITAMIN D) 400 UNITS TABS Take 400-800 Units by mouth 2 (two) times daily. Pt takes 400 mg at night. 800 mg qam    . diazepam (VALIUM) 5 MG tablet Take 1 tablet 30 minutes before procedure. 2 tablet 0  .  fish oil-omega-3 fatty acids 1000 MG capsule Take 2 g by mouth 2 (two) times daily.    Marland Kitchen gabapentin (NEURONTIN) 100 MG capsule Take 1 capsule (100 mg total) by mouth at bedtime. 180 capsule 0  . hydrochlorothiazide (HYDRODIURIL) 25 MG tablet Take 25 mg by mouth every morning.    . lovastatin (MEVACOR) 40 MG tablet Take 20 mg by mouth at bedtime.    . Multiple Vitamins-Minerals (MULTIVITAMIN WITH MINERALS) tablet Take 1 tablet by mouth daily.    . Multiple Vitamins-Minerals (OCUVITE PRESERVISION PO) Take 2 capsules by mouth daily.    Marland Kitchen olmesartan (BENICAR) 40 MG tablet Take 40 mg by mouth daily.    . predniSONE (DELTASONE) 20 MG tablet Take 2 tablets (40 mg total) by mouth daily with breakfast. 14 tablet 0  . Red Yeast Rice Extract 600 MG CAPS Take 600 mg by mouth 2 (two) times daily.    . tizanidine (ZANAFLEX) 2 MG capsule Take 1 capsule (2 mg total) by mouth at bedtime as needed for muscle spasms. 30 capsule 1  . triamcinolone cream (KENALOG) 0.5 % Apply 1 application topically 2 (two) times daily. To affected areas. 30 g 3  . TURMERIC PO Take 500 mg by mouth 2 (two) times daily.    . Vitamin D, Ergocalciferol, (DRISDOL) 1.25 MG (50000 UT) CAPS capsule TAKE 1 CAPSULE BY MOUTH EVERY 7 DAYS 12 capsule 0   Current Facility-Administered Medications on File Prior to Visit  Medication Dose Route Frequency Provider Last Rate Last Admin  . 0.9 %  sodium chloride infusion  500 mL Intravenous Continuous Milus Banister, MD        Allergies  Allergen Reactions  . Sulfa Antibiotics Rash    Objective: Physical Exam  General: Well developed, nourished, no acute distress, awake, alert and oriented x 3  Vascular: Dorsalis pedis artery 1/4 bilateral, Posterior tibial artery 0/4 bilateral, skin temperature warm to warm proximal to distal bilateral lower extremities, no varicosities, pedal hair present bilateral.  Neurological: Gross sensation present via light touch bilateral.   Dermatological: Skin  is warm, dry, and supple bilateral, Nails 1-10 are tender, long, thick, and discolored with mild subungal debris, no webspace macerations present bilateral, no open lesions present bilateral, no callus/corns/hyperkeratotic tissue present bilateral. + Dry skin plantar surfaces of both feet. No signs of infection bilateral.  Musculoskeletal: Asymptomatic end-stage bunion and hammertoes with second toe crossover boney deformities noted bilateral. Muscular strength within normal limits without painon range of motion. No pain with calf compression bilateral.  Assessment and Plan:  Problem List Items Addressed This Visit   None   Visit Diagnoses    Tinea    -  Primary   Relevant Medications   Terbinafine HCl 1 % SOLN   PVD (peripheral vascular disease) (HCC)       Dry skin       Pain due to onychomycosis of toenails of both feet       Relevant Medications   Terbinafine HCl 1 % SOLN   Vision impairment           -Examined patient.  -Discussed treatment options for painful mycotic nails and tinea. -Mechanically debrided and reduced mycotic nails with sterile nail nipper and dremel nail file without incident. -Prescribed Lamisil spray for patient to use as directed -Prescribed ammonium lactate for dryness and advised patient to use this if the foot miracle cream is ineffective; sample provided today -Patient to return in 3 months for follow up evaluation or sooner if symptoms worsen.  Landis Martins, DPM

## 2020-05-08 ENCOUNTER — Other Ambulatory Visit: Payer: Self-pay

## 2020-05-08 ENCOUNTER — Emergency Department (HOSPITAL_BASED_OUTPATIENT_CLINIC_OR_DEPARTMENT_OTHER): Payer: Medicare PPO

## 2020-05-08 ENCOUNTER — Emergency Department (HOSPITAL_BASED_OUTPATIENT_CLINIC_OR_DEPARTMENT_OTHER)
Admission: EM | Admit: 2020-05-08 | Discharge: 2020-05-08 | Disposition: A | Discharge status: Home / Self Care | Payer: Medicare PPO | Attending: Emergency Medicine | Admitting: Emergency Medicine

## 2020-05-08 ENCOUNTER — Encounter (HOSPITAL_BASED_OUTPATIENT_CLINIC_OR_DEPARTMENT_OTHER): Payer: Self-pay | Admitting: *Deleted

## 2020-05-08 DIAGNOSIS — Y92000 Kitchen of unspecified non-institutional (private) residence as  the place of occurrence of the external cause: Secondary | ICD-10-CM | POA: Diagnosis not present

## 2020-05-08 DIAGNOSIS — S2232XA Fracture of one rib, left side, initial encounter for closed fracture: Secondary | ICD-10-CM | POA: Diagnosis not present

## 2020-05-08 DIAGNOSIS — R29810 Facial weakness: Secondary | ICD-10-CM | POA: Insufficient documentation

## 2020-05-08 DIAGNOSIS — Z87891 Personal history of nicotine dependence: Secondary | ICD-10-CM | POA: Diagnosis not present

## 2020-05-08 DIAGNOSIS — S2242XA Multiple fractures of ribs, left side, initial encounter for closed fracture: Secondary | ICD-10-CM | POA: Diagnosis not present

## 2020-05-08 DIAGNOSIS — S0990XA Unspecified injury of head, initial encounter: Secondary | ICD-10-CM | POA: Diagnosis not present

## 2020-05-08 DIAGNOSIS — R4781 Slurred speech: Secondary | ICD-10-CM | POA: Diagnosis not present

## 2020-05-08 DIAGNOSIS — W19XXXA Unspecified fall, initial encounter: Secondary | ICD-10-CM

## 2020-05-08 DIAGNOSIS — R296 Repeated falls: Secondary | ICD-10-CM | POA: Insufficient documentation

## 2020-05-08 DIAGNOSIS — Z96651 Presence of right artificial knee joint: Secondary | ICD-10-CM | POA: Diagnosis not present

## 2020-05-08 DIAGNOSIS — Y93G3 Activity, cooking and baking: Secondary | ICD-10-CM | POA: Diagnosis not present

## 2020-05-08 DIAGNOSIS — M25552 Pain in left hip: Secondary | ICD-10-CM | POA: Diagnosis not present

## 2020-05-08 DIAGNOSIS — Z79899 Other long term (current) drug therapy: Secondary | ICD-10-CM | POA: Diagnosis not present

## 2020-05-08 DIAGNOSIS — R0781 Pleurodynia: Secondary | ICD-10-CM | POA: Diagnosis not present

## 2020-05-08 DIAGNOSIS — S13160A Subluxation of C5/C6 cervical vertebrae, initial encounter: Secondary | ICD-10-CM | POA: Diagnosis not present

## 2020-05-08 DIAGNOSIS — Z20822 Contact with and (suspected) exposure to covid-19: Secondary | ICD-10-CM | POA: Insufficient documentation

## 2020-05-08 DIAGNOSIS — M25512 Pain in left shoulder: Secondary | ICD-10-CM | POA: Diagnosis not present

## 2020-05-08 DIAGNOSIS — E871 Hypo-osmolality and hyponatremia: Secondary | ICD-10-CM | POA: Insufficient documentation

## 2020-05-08 DIAGNOSIS — I10 Essential (primary) hypertension: Secondary | ICD-10-CM | POA: Diagnosis not present

## 2020-05-08 DIAGNOSIS — S299XXA Unspecified injury of thorax, initial encounter: Secondary | ICD-10-CM | POA: Diagnosis present

## 2020-05-08 DIAGNOSIS — I160 Hypertensive urgency: Secondary | ICD-10-CM | POA: Diagnosis not present

## 2020-05-08 DIAGNOSIS — J9811 Atelectasis: Secondary | ICD-10-CM | POA: Diagnosis not present

## 2020-05-08 DIAGNOSIS — W1830XA Fall on same level, unspecified, initial encounter: Secondary | ICD-10-CM | POA: Insufficient documentation

## 2020-05-08 LAB — COMPREHENSIVE METABOLIC PANEL
ALT: 25 U/L (ref 0–44)
AST: 39 U/L (ref 15–41)
Albumin: 4 g/dL (ref 3.5–5.0)
Alkaline Phosphatase: 55 U/L (ref 38–126)
Anion gap: 10 (ref 5–15)
BUN: 17 mg/dL (ref 8–23)
CO2: 28 mmol/L (ref 22–32)
Calcium: 9.2 mg/dL (ref 8.9–10.3)
Chloride: 91 mmol/L — ABNORMAL LOW (ref 98–111)
Creatinine, Ser: 0.57 mg/dL (ref 0.44–1.00)
GFR, Estimated: >60 mL/min (ref >60)
Glucose, Bld: 122 mg/dL — ABNORMAL HIGH (ref 70–99)
Potassium: 3.4 mmol/L — ABNORMAL LOW (ref 3.5–5.1)
Sodium: 129 mmol/L — ABNORMAL LOW (ref 135–145)
Total Bilirubin: 0.8 mg/dL (ref 0.3–1.2)
Total Protein: 7.1 g/dL (ref 6.5–8.1)

## 2020-05-08 LAB — CBC WITH DIFFERENTIAL/PLATELET
Abs Immature Granulocytes: 0.04 10*3/uL (ref 0.00–0.07)
Basophils Absolute: 0 10*3/uL (ref 0.0–0.1)
Basophils Relative: 1 %
Eosinophils Absolute: 0.1 10*3/uL (ref 0.0–0.5)
Eosinophils Relative: 1 %
HCT: 39.2 % (ref 36.0–46.0)
Hemoglobin: 13.8 g/dL (ref 12.0–15.0)
Immature Granulocytes: 1 %
Lymphocytes Relative: 15 %
Lymphs Abs: 1.3 10*3/uL (ref 0.7–4.0)
MCH: 31.8 pg (ref 26.0–34.0)
MCHC: 35.2 g/dL (ref 30.0–36.0)
MCV: 90.3 fL (ref 80.0–100.0)
Monocytes Absolute: 0.8 10*3/uL (ref 0.1–1.0)
Monocytes Relative: 9 %
Neutro Abs: 6.5 10*3/uL (ref 1.7–7.7)
Neutrophils Relative %: 73 %
Platelets: 206 10*3/uL (ref 150–400)
RBC: 4.34 MIL/uL (ref 3.87–5.11)
RDW: 12.3 % (ref 11.5–15.5)
WBC: 8.7 10*3/uL (ref 4.0–10.5)
nRBC: 0 % (ref 0.0–0.2)

## 2020-05-08 LAB — PROTIME-INR
INR: 1 (ref 0.8–1.2)
Prothrombin Time: 12.4 seconds (ref 11.4–15.2)

## 2020-05-08 LAB — APTT: aPTT: 27 seconds (ref 24–36)

## 2020-05-08 LAB — RESP PANEL BY RT-PCR (FLU A&B, COVID) ARPGX2
Influenza A by PCR: NEGATIVE
Influenza B by PCR: NEGATIVE
SARS Coronavirus 2 by RT PCR: NEGATIVE

## 2020-05-08 LAB — ETHANOL: Alcohol, Ethyl (B): <10 mg/dL (ref <10)

## 2020-05-08 MED ORDER — ACETAMINOPHEN 500 MG PO TABS
1000.0000 mg | ORAL_TABLET | Freq: Once | ORAL | Status: AC
  Administered 2020-05-08: 1000 mg via ORAL
  Filled 2020-05-08: qty 2

## 2020-05-08 MED ORDER — LORAZEPAM 2 MG/ML IJ SOLN
0.5000 mg | Freq: Once | INTRAMUSCULAR | Status: AC
  Administered 2020-05-08: 0.5 mg via INTRAVENOUS
  Filled 2020-05-08: qty 1

## 2020-05-08 MED ORDER — HYDRALAZINE HCL 10 MG PO TABS
10.0000 mg | ORAL_TABLET | Freq: Once | ORAL | Status: AC
  Administered 2020-05-08: 10 mg via ORAL
  Filled 2020-05-08: qty 1

## 2020-05-08 MED ORDER — AMLODIPINE BESYLATE 5 MG PO TABS: 5.0000 mg | ORAL_TABLET | Freq: Every day | ORAL | 0 refills | Status: DC

## 2020-05-08 NOTE — ED Notes (Signed)
Patient transported to CT 

## 2020-05-08 NOTE — ED Triage Notes (Signed)
PA at bedside for MSE

## 2020-05-08 NOTE — ED Provider Notes (Signed)
[facility] EMERGENCY DEPARTMENT Provider Note   CSN: [id number] Arrival date & time: 05/08/20  1413     History Chief Complaint  Patient presents with  . Fall    Sarah Castro is a 80 y.o. female.  HPI      80yo female with history of hypertension, hyperlipidemia, T12 compression fracture, who presents with concern for multiple falls.  Reports yesterday she was in the bathroom, and turned her head and when she did she lost her balance and fell.  She reports she was able to get up and continue with her normal routine, without continuing dizziness, numbness, weakness, difficulty walking or other concerns.  This morning, while she was cooking breakfast, she again turned her head and lost balance.  Today, she landed on her left side and has severe left shoulder, left rib pain, and also reports left hip pain.  Her daughter noted a right facial droop this morning which she reports was very slight, and she did not notice it when she smiled, but noticed that it seemed like her right side was not moving as it normally would when she is talking.  She did not have speech difficulty, but daughter thought that because of the right facial droop she seemed a little bit different.  She denied numbness, weakness, dizziness, visual changes or difficulty walking.    Past Medical History:  Diagnosis Date  . Arthritis   . Hypercholesteremia   . Hypertension   . PONV (postoperative nausea and vomiting)     Patient Active Problem List   Diagnosis Date Noted  . T12 compression fracture ([place]) 10/14/2019  . Rotator cuff arthropathy of right shoulder 10/08/2018  . 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  . Displaced comminuted supracondylar fracture without intercondylar fracture of right humerus, initial encounter for closed fracture 01/10/2018  . Spinal stenosis 06/12/2016  . Neurogenic claudication ([place]) 06/12/2016  . Lumbar radiculopathy 12/08/2015  . Degenerative arthritis of  left knee 10/27/2015  . Piriformis syndrome of right side 01/11/2015  . Greater trochanteric bursitis of right hip 11/17/2014  . Toenail fungus 08/04/2014  . Pes planus of both feet 08/22/2013  . Primary localized osteoarthrosis, lower leg 07/25/2013  . Expected blood loss anemia 03/19/2012  . S/P right TKA 03/18/2012    Past Surgical History:  Procedure Laterality Date  . APPENDECTOMY    . broken back    . BUNIONECTOMY  2013  . CATARACT EXTRACTION     right  . [place]  2013  . HEMATOMA EVACUATION  5 years ago  . IR RADIOLOGIST EVAL & MGMT  12/04/2019  . KNEE ARTHROSCOPY     right  . LAPAROSCOPIC APPENDECTOMY N/A 10/24/2012   Procedure: APPENDECTOMY LAPAROSCOPIC;  Surgeon: Rolm Bookbinder, MD;  Location: [facility];  Service: General;  Laterality: N/A;  . LAPAROSCOPIC APPENDECTOMY    . POSTERIOR TIBIAL TENDON REPAIR  1998  . REPLACEMENT TOTAL KNEE Right   . TONSILLECTOMY   age 46  . 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   Procedure: TOTAL KNEE ARTHROPLASTY;  Surgeon: Mauri Pole, MD;  Location: [facility];  Service: Orthopedics;  Laterality: Right;     OB History   No obstetric history on file.     Family History  Problem Relation Age of Onset  . Cancer Father        throat  . Cancer Sister        non hodgkins lymphoma  . Colon cancer Maternal Grandmother 10  Social History   Tobacco Use  . Smoking status: Former Smoker    Packs/day: 0.50    Years: 20.00    Pack years: 10.00    Types: Cigarettes    Quit date: 02/07/2011    Years since quitting: 9.2  . Smokeless tobacco: Never Used  Vaping Use  . Vaping Use: Never used  Substance Use Topics  . Alcohol use: Yes    Alcohol/week: 7.0 standard drinks    Types: 7 Standard drinks or equivalent per week  . Drug use: No    Home Medications Prior to Admission medications   Medication Sig Start Date End Date Taking? Authorizing Provider  amLODipine (NORVASC) 5 MG tablet Take 1 tablet (5 mg total) by  mouth daily. 05/08/20  Yes Gareth Morgan, MD  calcitonin, salmon, (MIACALCIN/FORTICAL) 200 UNIT/ACT nasal spray PLACE 1 SPRAY INTO ALTERNATE NOSTRILS DAILY 10/14/19  Yes Lyndal Pulley, DO  Calcium Carbonate-Vitamin D (CALCIUM 600 + D PO) Take 1 tablet by mouth 2 (two) times daily.   Yes [provider]  cholecalciferol (VITAMIN D) 400 UNITS TABS Take 400-800 Units by mouth 2 (two) times daily. Pt takes 400 mg at night. 800 mg qam   Yes [provider]  hydrochlorothiazide (HYDRODIURIL) 25 MG tablet Take 25 mg by mouth every morning.   Yes [provider]  lovastatin (MEVACOR) 40 MG tablet Take 20 mg by mouth at bedtime.   Yes [provider]  metoprolol succinate (TOPROL-XL) 25 MG 24 hr tablet Take 25 mg by mouth daily.   Yes [provider]  Multiple Vitamins-Minerals (MULTIVITAMIN WITH MINERALS) tablet Take 1 tablet by mouth daily.   Yes [provider]  Multiple Vitamins-Minerals (OCUVITE PRESERVISION PO) Take 2 capsules by mouth daily.   Yes [provider]  olmesartan (BENICAR) 40 MG tablet Take 40 mg by mouth daily.   Yes [provider]  Red Yeast Rice Extract 600 MG CAPS Take 600 mg by mouth 2 (two) times daily.   Yes [provider]  Terbinafine HCl 1 % SOLN Spray to feet and nails once daily for foot fungus 04/08/20  Yes Stover, Titorya, DPM  tizanidine (ZANAFLEX) 2 MG capsule Take 1 capsule (2 mg total) by mouth at bedtime as needed for muscle spasms. 12/04/18  Yes Lyndal Pulley, DO  triamcinolone cream (KENALOG) 0.5 % Apply 1 application topically 2 (two) times daily. To affected areas. 06/12/18  Yes Lyndal Pulley, DO  TURMERIC PO Take 500 mg by mouth 2 (two) times daily.   Yes [provider]  Vitamin D, Ergocalciferol, (DRISDOL) 1.25 MG (50000 UT) CAPS capsule TAKE 1 CAPSULE BY MOUTH EVERY 7 DAYS 09/10/18  Yes Hulan Saas M, DO  fish oil-omega-3 fatty acids 1000 MG capsule Take 2 g by mouth 2  (two) times daily.    [provider]  gabapentin (NEURONTIN) 100 MG capsule Take 1 capsule (100 mg total) by mouth at bedtime. 04/22/19   Lyndal Pulley, DO    Allergies    Amlodipine, Hydrocodone, and Sulfa antibiotics  Review of Systems   Review of Systems  Constitutional: Negative for fever.  HENT: Negative for sore throat.   Eyes: Negative for visual disturbance.  Respiratory: Negative for cough and shortness of breath.   Cardiovascular: Positive for chest pain (left rib pain).  Gastrointestinal: Negative for abdominal pain, nausea and vomiting.  Genitourinary: Negative for difficulty urinating and dysuria.  Musculoskeletal: Positive for arthralgias. Negative for back pain and neck  pain.  Skin: Negative for rash.  Neurological: Positive for dizziness (just prior to falling) and facial asymmetry. Negative for syncope, speech difficulty, weakness, numbness and headaches.    Physical Exam Updated Vital Signs BP (!) 182/71 (BP Location: Right Arm) Comment: MD aware and to prescribe BP med   Pulse 65   Temp 98 F (36.7 C) (Oral)   Resp 13   Ht 5\' 2"  (1.575 m)   Wt 63.5 kg   SpO2 93%   BMI 25.61 kg/m   Physical Exam Vitals and nursing note reviewed.  Constitutional:      General: She is not in acute distress.    Appearance: She is well-developed. She is not diaphoretic.  HENT:     Head: Normocephalic and atraumatic.  Eyes:     General: No visual field deficit.    Conjunctiva/sclera: Conjunctivae normal.  Cardiovascular:     Rate and Rhythm: Normal rate and regular rhythm.     Heart sounds: Normal heart sounds. No murmur heard. No friction rub. No gallop.   Pulmonary:     Effort: Pulmonary effort is normal. No respiratory distress.     Breath sounds: Normal breath sounds. No wheezing or rales.  Abdominal:     General: There is no distension.     Palpations: Abdomen is soft.     Tenderness: There is no abdominal tenderness. There is no guarding.   Musculoskeletal:        General: No tenderness.     Cervical back: Normal range of motion.  Skin:    General: Skin is warm and dry.     Findings: No erythema or rash.  Neurological:     Mental Status: She is alert and oriented to person, place, and time.     GCS: GCS eye subscore is 4. GCS verbal subscore is 5. GCS motor subscore is 6.     Cranial Nerves: Cranial nerves are intact. No cranial nerve deficit, dysarthria or facial asymmetry (do not detect asymmetry with smile).     Sensory: Sensation is intact. No sensory deficit.     Motor: Motor function is intact. No pronator drift.     Coordination: Coordination is intact. Coordination normal.     ED Results / Procedures / Treatments   Labs (all labs ordered are listed, but only abnormal results are displayed) Labs Reviewed  COMPREHENSIVE METABOLIC PANEL - Abnormal; Notable for the following components:      Result Value   Sodium 129 (*)    Potassium 3.4 (*)    Chloride 91 (*)    Glucose, Bld 122 (*)    All other components within normal limits  RESP PANEL BY RT-PCR (FLU A&B, COVID) ARPGX2  CBC WITH DIFFERENTIAL/PLATELET  ETHANOL  PROTIME-INR  APTT    EKG EKG Interpretation  Date/Time:  Saturday May 08 2020 16:27:12 EDT Ventricular Rate:  68 PR Interval:  243 QRS Duration: 94 QT Interval:  438 QTC Calculation: 466 R Axis:   -86 Text Interpretation: Sinus rhythm Prolonged PR interval Inferior infarct, old Anterior infarct, old No significant change since last tracing Confirmed by Gareth Morgan [phone]) on 05/09/2020 11:40:30 AM   Radiology CT Head Wo Contrast  Result Date: 05/08/2020 CLINICAL DATA:  TIA with facial droop and slurred speech resolved. Fall yesterday. EXAM: CT HEAD WITHOUT CONTRAST CT CERVICAL SPINE WITHOUT CONTRAST TECHNIQUE: Multidetector CT imaging of the head and cervical spine was performed following the standard protocol without intravenous contrast. Multiplanar CT image reconstructions of the  cervical spine were also generated. COMPARISON:  None. FINDINGS: CT HEAD FINDINGS Brain: Ventricles, cisterns and other CSF spaces are normal. There is mild chronic ischemic microvascular disease. There is no mass, mass effect, shift of midline structures or acute hemorrhage. No evidence of acute infarction. Vascular: No hyperdense vessel or unexpected calcification. Skull: Normal. Negative for fracture or focal lesion. Sinuses/Orbits: Somewhat shrunken, deformed and calcified right globe. Paranasal sinuses are unremarkable. Other: None. CT CERVICAL SPINE FINDINGS Alignment: Subtle stairstep anterior subluxation of C7 on T1 and C6 and C7 as well as CT 3 on C4 compatible with chronic degenerative changes. There is 3-4 mm posterior subluxation of C5 with respect to C4 and C6 likely due to the severe facet arthropathy. Skull base and vertebrae: Vertebral body heights are normal. There is moderate spondylosis of the cervical spine to include facet arthropathy and uncovertebral joint spurring. Right-sided neural foraminal narrowing at the C2-3 level. Bilateral neural foraminal narrowing at the C4-5 and C5-6 levels as well as C6-7 level. No acute fracture. Soft tissues and spinal canal: Prevertebral soft tissues are normal. There is minimal canal stenosis at the C5-6 level due to the subtle posterior subluxation of C5 and posterior spurring. Disc levels: Disc space narrowing most prominent at the C5-6 level and to lesser degree at the C4-5 and C6-7 levels. Upper chest: No acute findings. Other: None. IMPRESSION: 1. No acute brain injury. 2. Mild chronic ischemic microvascular disease. 3. No acute cervical spine injury. 4. Moderate spondylosis of the cervical spine with moderate multilevel disc disease and multilevel bilateral neural foraminal narrowing as described. Minimal canal stenosis at the C5-6 level due to subtle degenerative posterior subluxation of C5 and posterior osteophyte formation. Electronically Signed    By: Marin Olp M.D.   On: 05/08/2020 16:19   CT Chest Wo Contrast  Addendum Date: 05/08/2020   ADDENDUM REPORT: 05/08/2020 19:56 ADDENDUM: Mild degenerative changes of the right shoulder. Question calcific tendinosis of the left shoulder (2:44). These results were called by telephone at the time of interpretation on 05/08/2020 at 7:56 pm to provider [facility] , who verbally acknowledged these results. Electronically Signed   By: Iven Finn M.D.   On: 05/08/2020 19:56   Result Date: 05/08/2020 CLINICAL DATA:  Rib fracture suspected. Fell yesterday in bathroom. Left side fall. EXAM: CT CHEST WITHOUT CONTRAST TECHNIQUE: Multidetector CT imaging of the chest was performed following the standard protocol without IV contrast. COMPARISON:  None. FINDINGS: Ports and Devices: None. Lungs/airways: Linear atelectasis of the lingula. Linear atelectasis versus scarring of bilateral lower lobes at the bases. Trace paraseptal emphysematous changes at the apices. No focal consolidation. Pulmonary micronodule within the left upper lobe (4:61). No pulmonary mass. No pulmonary contusion or laceration. No pneumatocele formation. The central airways are patent. Pleura: No pleural effusion. No pneumothorax. No hemothorax. Lymph Nodes: No mediastinal, hilar, or axillary lymphadenopathy. Mediastinum: No pneumomediastinum. No aortic injury or mediastinal hematoma. The thoracic aorta is normal in caliber. Severe atherosclerotic plaque of the thoracic aorta. The heart is normal in size. No significant pericardial effusion. At least mild to moderate three-vessel coronary artery calcifications. The esophagus is unremarkable. The thyroid is unremarkable. Chest Wall / Breasts: No chest wall mass. Visualized upper abdomen: Several prominent upper abdominal lymph nodes are noted (2:36, 160). No acute abnormality. Musculoskeletal: Hypoplastic right twelfth rib. No left 12th rib on the left noted. Nondisplaced left anterior third,  fourth, fifth rib fractures that appear chronic. Chronic left anterolateral seventh rib fracture. No sternal fracture.  No spinal fracture. IMPRESSION: 1. Several chronic appearing nondisplaced anterior left rib fractures with no definite acute displaced rib fracture. Correlate with point tenderness to palpation to evaluate for an acute component along the anterior 3-7th ribs on the left. 2. Otherwise no acute traumatic injury to the chest. 3. No acute fracture or traumatic malalignment of the thoracic spine. 4. Other imaging findings of potential clinical significance: Nonspecific upper abdominal prominent lymph nodes. Aortic Atherosclerosis (ICD10-I70.0) including coronary artery calcifications. Emphysema (ICD10-J43.9). Electronically Signed: By: Iven Finn M.D. On: 05/08/2020 16:22   CT Cervical Spine Wo Contrast  Result Date: 05/08/2020 CLINICAL DATA:  TIA with facial droop and slurred speech resolved. Fall yesterday. EXAM: CT HEAD WITHOUT CONTRAST CT CERVICAL SPINE WITHOUT CONTRAST TECHNIQUE: Multidetector CT imaging of the head and cervical spine was performed following the standard protocol without intravenous contrast. Multiplanar CT image reconstructions of the cervical spine were also generated. COMPARISON:  None. FINDINGS: CT HEAD FINDINGS Brain: Ventricles, cisterns and other CSF spaces are normal. There is mild chronic ischemic microvascular disease. There is no mass, mass effect, shift of midline structures or acute hemorrhage. No evidence of acute infarction. Vascular: No hyperdense vessel or unexpected calcification. Skull: Normal. Negative for fracture or focal lesion. Sinuses/Orbits: Somewhat shrunken, deformed and calcified right globe. Paranasal sinuses are unremarkable. Other: None. CT CERVICAL SPINE FINDINGS Alignment: Subtle stairstep anterior subluxation of C7 on T1 and C6 and C7 as well as CT 3 on C4 compatible with chronic degenerative changes. There is 3-4 mm posterior subluxation  of C5 with respect to C4 and C6 likely due to the severe facet arthropathy. Skull base and vertebrae: Vertebral body heights are normal. There is moderate spondylosis of the cervical spine to include facet arthropathy and uncovertebral joint spurring. Right-sided neural foraminal narrowing at the C2-3 level. Bilateral neural foraminal narrowing at the C4-5 and C5-6 levels as well as C6-7 level. No acute fracture. Soft tissues and spinal canal: Prevertebral soft tissues are normal. There is minimal canal stenosis at the C5-6 level due to the subtle posterior subluxation of C5 and posterior spurring. Disc levels: Disc space narrowing most prominent at the C5-6 level and to lesser degree at the C4-5 and C6-7 levels. Upper chest: No acute findings. Other: None. IMPRESSION: 1. No acute brain injury. 2. Mild chronic ischemic microvascular disease. 3. No acute cervical spine injury. 4. Moderate spondylosis of the cervical spine with moderate multilevel disc disease and multilevel bilateral neural foraminal narrowing as described. Minimal canal stenosis at the C5-6 level due to subtle degenerative posterior subluxation of C5 and posterior osteophyte formation. Electronically Signed   By: Marin Olp M.D.   On: 05/08/2020 16:19   MR BRAIN WO CONTRAST  Result Date: 05/08/2020 CLINICAL DATA:  Stroke suspected.  Two recent falls.  Head trauma. EXAM: MRI HEAD WITHOUT CONTRAST TECHNIQUE: Multiplanar, multiecho pulse sequences of the brain and surrounding structures were obtained without intravenous contrast. COMPARISON:  CT head without contrast 05/08/2020. FINDINGS: Brain: No acute infarct, hemorrhage, or mass lesion is present. Moderate periventricular and subcortical T2 hyperintensities are present bilaterally. Remote lacunar infarct is present in the left basal ganglia. Mild generalized atrophy is present. The ventricles are proportionate to the degree of atrophy. No significant extraaxial fluid collection is present.  The internal auditory canals are within normal limits. The brainstem and cerebellum are within normal limits. Vascular: Flow is present in the major intracranial arteries. Skull and upper cervical spine: The craniocervical junction is normal. Upper cervical spine is within  normal limits. Marrow signal is unremarkable. Sinuses/Orbits: The paranasal sinuses and mastoid air cells are clear. Left lens replacement present. Right phthisis bulbi noted. Globes and orbits otherwise within normal limits. IMPRESSION: 1. No acute intracranial abnormality. 2. Atrophy and white matter disease is moderately advanced for age. This likely reflects the sequela of chronic microvascular ischemia. 3. 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 Electronically Signed   By: San Morelle M.D.   On: 05/08/2020 18:54   DG Hip Unilat W or Wo Pelvis 2-3 Views Left  Result Date: 05/08/2020 CLINICAL DATA:  Pain after fall EXAM: DG HIP (WITH OR WITHOUT PELVIS) 2-3V LEFT COMPARISON:  December 04, 2018 FINDINGS: Vascular calcifications. Healed right inferior pubic ramus fracture. Mild irregularity of the right acetabulum is stable and nonacute. A single frontal view of the right hip is intact. Three views of the left hip demonstrate no fracture. No other acute abnormalities. IMPRESSION: No acute fractures. Electronically Signed   By: Dorise Bullion III M.D   On: 05/08/2020 16:15    Procedures Procedures   Medications Ordered in ED Medications  acetaminophen (TYLENOL) tablet 1,000 mg (1,000 mg Oral Given 05/08/20 1604)  LORazepam (ATIVAN) injection 0.5 mg (0.5 mg Intravenous Given 05/08/20 1739)  hydrALAZINE (APRESOLINE) tablet 10 mg (10 mg Oral Given 05/08/20 1626)    ED Course  I have reviewed the triage vital signs and the nursing notes.  Pertinent labs & imaging results that were available during my care of the patient were reviewed by me and considered in my medical decision making (see chart for details).    MDM  Rules/Calculators/A&P                         80yo female with history of hypertension, hyperlipidemia, T12 compression fracture, who presents with concern for multiple falls, left rib pain, left shoulder pain, and slight right facial droop.   Labs show no significant anemia, leukocytosis. m labs show hyponatremia with a sodium of 129, with most recent in our system in 2014.  X-ray of the hip shows no acute fracture.  Given age, significant rib tenderness, age with fall and neurologic abnormalities, CT head, cervical spine and chest were ordered which showed no acute abnormalities of head and neck.   CT chest shows some shoulder calcific tendinosis but no evidence of fracture of shoulder--does show more chronic appearing fractures of ribs. Did have a fall last year and not sure if they may have occurred then--does have some chest wall tenderness laterally-discussed with 3 acute rib fractures at her age would recommend admission however they prefer outpatient treatment and given it is not clear clinically if these represent fx I feel this is reasonable. Given an incentive spirometer, discussed pain control. She reports her shoulder pain is worse than that of her ribs.  Given shoulder sling, recommend sports medicine follow up for suspected rotator cuff pathology.   Given daughter's concern for right facial droop, MRI brain also ordered which shows microvascular changes signs of old lacunar CVA but no acute abnormalities.   Pt with hypertension in ED, will initiate amlodipine, recommend PCP follow up for this and hyponatremia. Given neurology number given hx of prior stroke seen on MR.    Final Clinical Impression(s) / ED Diagnoses Final diagnoses:  Fall, initial encounter  Closed fracture of multiple ribs of left side, initial encounter  Acute pain of left shoulder  Hyponatremia    Rx / DC Orders ED Discharge  Orders         Ordered    amLODipine (NORVASC) 5 MG tablet  Daily        05/08/20  1958           Gareth Morgan, MD 05/09/20 1145

## 2020-05-08 NOTE — ED Notes (Signed)
Patient transported to  mri 

## 2020-05-08 NOTE — ED Notes (Signed)
Patient given Incentive spirometry per MD order. Patient educated and demonstrated return teach back.

## 2020-05-08 NOTE — ED Triage Notes (Signed)
Pt reports yesterday she was in the bathroom and fell. Today the same thing happened in the kitchen while she was cooking breakfast. States "I turned and lost my balance". States she landed on her left side today and "sat down" yesterday when she fell in the bathroom. Denies LOC, state she bumped her head yesterday

## 2020-05-08 NOTE — ED Notes (Signed)
Patient transported to MRI 

## 2020-05-08 NOTE — ED Notes (Addendum)
Patient returned from MRI.

## 2020-05-08 NOTE — ED Triage Notes (Signed)
Pt and family aware of AMA process

## 2020-05-10 DIAGNOSIS — W19XXXA Unspecified fall, initial encounter: Secondary | ICD-10-CM | POA: Diagnosis not present

## 2020-05-10 DIAGNOSIS — I639 Cerebral infarction, unspecified: Secondary | ICD-10-CM | POA: Diagnosis not present

## 2020-05-10 DIAGNOSIS — I10 Essential (primary) hypertension: Secondary | ICD-10-CM | POA: Diagnosis not present

## 2020-05-10 DIAGNOSIS — R269 Unspecified abnormalities of gait and mobility: Secondary | ICD-10-CM | POA: Diagnosis not present

## 2020-05-10 DIAGNOSIS — S2239XA Fracture of one rib, unspecified side, initial encounter for closed fracture: Secondary | ICD-10-CM | POA: Diagnosis not present

## 2020-05-11 NOTE — Progress Notes (Signed)
[facility] [address] [place] Phone: [phone] Subjective:   I [name] am serving as a Education administrator for Dr. Hulan Saas.  This visit occurred during the SARS-CoV-2 public health emergency.  Safety protocols were in place, including screening questions prior to the visit, additional usage of staff PPE, and extensive cleaning of exam room while observing appropriate contact time as indicated for disinfecting solutions.   I'm seeing this patient by the request  of:  Wenda Low, MD  CC: Multiple complaints after fall  [id number]  BATOUL LIMES is a 80 y.o. female coming in with complaint of knee pain. Last seen for T12 vertebral fx in December 2021. Steroid injection given 09/06/2019, L knee. Patient has fallen since last visit injuring L shoulder. Patient states she has pain in the left knee and right hip. States she fell last Friday and Saturday (1st and 2nd). Loss of ROM and bruising. Left groin pain from fall.     Patient did fall April 2.  Seen in the emergency room and had multiple rib fractures.Patient was found to have nondisplaced left anterior third, fourth, fifth rib fractures patient states that the ribs do hurt.  Patient states also the arm does give her some difficulty on the left side.  Patient has bruising of the arm but not of the ribs she states.  Past Medical History:  Diagnosis Date  . Arthritis   . Hypercholesteremia   . Hypertension   . PONV (postoperative nausea and vomiting)    Past Surgical History:  Procedure Laterality Date  . APPENDECTOMY    . broken back    . BUNIONECTOMY  2013  . CATARACT EXTRACTION     right  . [place]  2013  . HEMATOMA EVACUATION  5 years ago  . IR RADIOLOGIST EVAL & MGMT  12/04/2019  . KNEE ARTHROSCOPY     right  . LAPAROSCOPIC APPENDECTOMY N/A 10/24/2012   Procedure: APPENDECTOMY LAPAROSCOPIC;  Surgeon: Rolm Bookbinder, MD;  Location: [facility];  Service:  General;  Laterality: N/A;  . LAPAROSCOPIC APPENDECTOMY    . POSTERIOR TIBIAL TENDON REPAIR  1998  . REPLACEMENT TOTAL KNEE Right   . TONSILLECTOMY   age 40  . 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 Marital status: Married    Spouse name: Not on file  . Number of children: Not on file  . Years of education: Not on file  . Highest education level: Not on file  Occupational History  . Not on file  Tobacco Use  . Smoking status: Former Smoker    Packs/day: 0.50    Years: 20.00    Pack years: 10.00    Types: Cigarettes    Quit date: 02/07/2011    Years since quitting: 9.2  . Smokeless tobacco: Never Used  Vaping Use  . Vaping Use: Never used  Substance and Sexual Activity  . Alcohol use: Yes    Alcohol/week: 7.0 standard drinks    Types: 7 Standard drinks or equivalent per week  . Drug use: No  . Sexual activity: Not on file  Other Topics Concern  . Not on file  Social History Narrative  . Not on file   Social Determinants of Health   Financial Resource Strain: Not on file  Food  Insecurity: Not on file  Transportation Needs: Not on file  Physical Activity: Not on file  Stress: Not on file  Social Connections: Not on file   Allergies  Allergen Reactions  . Amlodipine Swelling  . Hydrocodone Nausea And Vomiting  . Sulfa Antibiotics Rash   Family History  Problem Relation Age of Onset  . Cancer Father        throat  . Cancer Sister        non hodgkins lymphoma  . Colon cancer Maternal Grandmother 65    Current Outpatient Medications (Endocrine & Metabolic):  .  calcitonin, salmon, (MIACALCIN/FORTICAL) 200 UNIT/ACT nasal spray, PLACE 1 SPRAY INTO ALTERNATE NOSTRILS DAILY   Current Outpatient Medications (Cardiovascular):  .  amLODipine (NORVASC) 5 MG tablet, Take 1 tablet (5 mg total) by  mouth daily. .  hydrochlorothiazide (HYDRODIURIL) 25 MG tablet, Take 25 mg by mouth every morning. .  lovastatin (MEVACOR) 40 MG tablet, Take 20 mg by mouth at bedtime. .  metoprolol succinate (TOPROL-XL) 25 MG 24 hr tablet, Take 25 mg by mouth daily. Marland Kitchen  olmesartan (BENICAR) 40 MG tablet, Take 40 mg by mouth daily.         Current Outpatient Medications (Other):  Marland Kitchen  AMBULATORY NON FORMULARY MEDICATION, Rolling walker .  Calcium Carbonate-Vitamin D (CALCIUM 600 + D PO), Take 1 tablet by mouth 2 (two) times daily. .  cholecalciferol (VITAMIN D) 400 UNITS TABS, Take 400-800 Units by mouth 2 (two) times daily. Pt takes 400 mg at night. 800 mg qam .  fish oil-omega-3 fatty acids 1000 MG capsule, Take 2 g by mouth 2 (two) times daily. Marland Kitchen  gabapentin (NEURONTIN) 100 MG capsule, Take 1 capsule (100 mg total) by mouth at bedtime. .  Multiple Vitamins-Minerals (MULTIVITAMIN WITH MINERALS) tablet, Take 1 tablet by mouth daily. .  Multiple Vitamins-Minerals (OCUVITE PRESERVISION PO), Take 2 capsules by mouth daily. .  Red Yeast Rice Extract 600 MG CAPS, Take 600 mg by mouth 2 (two) times daily. .  Terbinafine HCl 1 % SOLN, Spray to feet and nails once daily for foot fungus .  tizanidine (ZANAFLEX) 2 MG capsule, Take 1 capsule (2 mg total) by mouth at bedtime as needed for muscle spasms. Marland Kitchen  triamcinolone cream (KENALOG) 0.5 %, Apply 1 application topically 2 (two) times daily. To affected areas. .  TURMERIC PO, Take 500 mg by mouth 2 (two) times daily. .  Vitamin D, Ergocalciferol, (DRISDOL) 1.25 MG (50000 UT) CAPS capsule, TAKE 1 CAPSULE BY MOUTH EVERY 7 DAYS  Current Facility-Administered Medications (Other):  .  0.9 %  sodium chloride infusion   Reviewed prior external information including notes and imaging from  primary care provider As well as notes that were available from care everywhere and other healthcare systems.  Past medical history, social, surgical and family history all  reviewed in electronic medical record.  No pertanent information unless stated regarding to the chief complaint.   Review of Systems:  No headache, visual changes, nausea, vomiting, diarrhea, constipation, dizziness, abdominal pain, skin rash, fevers, chills, night sweats, weight loss, swollen lymph nodes, body aches, joint swelling, chest pain, shortness of breath, mood changes. POSITIVE muscle aches  Objective  Blood pressure (!) 148/70, pulse 68, height 5\' 2"  (1.575 m), weight 143 lb (64.9 kg), SpO2 98 %.   General: No apparent distress alert and oriented x3 mood and affect normal, dressed appropriately.  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 Left arm does have bruising down the bicep.  Patient does though have fairly good range of motion with 160 degrees of active range of motion.  Very mild crepitus noted.  Mild pain with stressing of the bicep with speeds test.  No defect noted.  Patient is tender to palpation over the left side of the anterior lateral ribs.  No crepitus felt though.  No shortness of breath. Patient's knees do have arthritic changes.  Left knee does have instability with valgus and varus force.  Left hip has fairly decent range of motion.  Negative fulcrum test.  Mild increase in pain noted with flexion of the joint. No significant back pain or midline tenderness.    Impression and Recommendations:     The above documentation has been reviewed and is accurate and complete Lyndal Pulley, DO

## 2020-05-12 ENCOUNTER — Ambulatory Visit (INDEPENDENT_AMBULATORY_CARE_PROVIDER_SITE_OTHER): Payer: Medicare PPO

## 2020-05-12 ENCOUNTER — Ambulatory Visit (INDEPENDENT_AMBULATORY_CARE_PROVIDER_SITE_OTHER): Payer: Medicare PPO | Admitting: Family Medicine

## 2020-05-12 ENCOUNTER — Encounter: Payer: Self-pay | Admitting: Family Medicine

## 2020-05-12 ENCOUNTER — Other Ambulatory Visit: Payer: Self-pay

## 2020-05-12 VITALS — BP 148/70 | HR 68 | Ht 62.0 in | Wt 143.0 lb

## 2020-05-12 DIAGNOSIS — G8929 Other chronic pain: Secondary | ICD-10-CM

## 2020-05-12 DIAGNOSIS — M1712 Unilateral primary osteoarthritis, left knee: Secondary | ICD-10-CM

## 2020-05-12 DIAGNOSIS — S2242XA Multiple fractures of ribs, left side, initial encounter for closed fracture: Secondary | ICD-10-CM | POA: Diagnosis not present

## 2020-05-12 DIAGNOSIS — M25512 Pain in left shoulder: Secondary | ICD-10-CM

## 2020-05-12 DIAGNOSIS — M25552 Pain in left hip: Secondary | ICD-10-CM | POA: Diagnosis not present

## 2020-05-12 MED ORDER — AMBULATORY NON FORMULARY MEDICATION: 0 refills | Status: AC

## 2020-05-12 NOTE — Patient Instructions (Addendum)
Good to see you Arnica lotion for the bruising 2 times a day  For the arm lets watch and see how it does but you have good movement.  Ice 20 minutes every 4 hours to whatever hurts you  We will get new xray of hip today  We will get you a rolling walker and go to [facility]  If pain worsens can do the tramadol but take it with a tylenol- works better together  10 deep breaths every hour We will hold on injections today  See me again in 3 weeks or CALL if you need anything

## 2020-05-13 ENCOUNTER — Other Ambulatory Visit: Payer: Self-pay

## 2020-05-13 ENCOUNTER — Encounter: Payer: Self-pay | Admitting: Family Medicine

## 2020-05-13 ENCOUNTER — Telehealth: Payer: Self-pay | Admitting: Family Medicine

## 2020-05-13 DIAGNOSIS — S2232XA Fracture of one rib, left side, initial encounter for closed fracture: Secondary | ICD-10-CM | POA: Insufficient documentation

## 2020-05-13 MED ORDER — AMBULATORY NON FORMULARY MEDICATION: 1.0000 [IU] | Freq: Once | 0 refills | Status: AC

## 2020-05-13 NOTE — Assessment & Plan Note (Signed)
Multiple rib fractures on the left side.  Patient though is doing relatively well for this happening 4 days ago.  Discussed with patient about using the tramadol.  Patient would like to try to avoid it.  Patient has been taking ibuprofen and discussed monitoring for any type of bruising or abdominal pain.  Patient does have muscle relaxer if necessary at night.  Discussed the importance of taking deep breaths and patient does have an incentive spirometer.  We will get x-rays of the shoulder secondary to the bruising.  Follow-up again in 2-3 weeks

## 2020-05-13 NOTE — Telephone Encounter (Signed)
Patient called stating that she took the order for the Rolator to a medical supply store but due to Medicare, they are needing a diagnosis code. She asked if another order could be emailed to her with the diagnosis code? (I can email it when it is ready, [email or web address]).  She also mentioned that Dr Tamala Julian was going to put in another referral to Neurology in hopes of getting her in sooner. It does not look like it has been put in yet.

## 2020-05-13 NOTE — Assessment & Plan Note (Signed)
Known arthritic changes and would normally do injection but will hold at this time with patient wanting to heal a little better.  Patient will continue conservative therapy with the knee but will follow up in 3 weeks when hopefully will be able to do injections.

## 2020-05-13 NOTE — Telephone Encounter (Signed)
Order has been Restaurant manager, [employer].

## 2020-06-01 NOTE — Progress Notes (Signed)
[facility] [address] [place] Phone: [phone] Subjective:   I Sarah Castro am serving as a Education administrator for Dr. Hulan Saas.  This visit occurred during the SARS-CoV-2 public health emergency.  Safety protocols were in place, including screening questions prior to the visit, additional usage of staff PPE, and extensive cleaning of exam room while observing appropriate contact time as indicated for disinfecting solutions.   I'm seeing this patient by the request  of:  Wenda Low, MD  CC: Rib fracture follow-up and knee pain  [id number]   05/12/2020 Multiple rib fractures on the left side.  Patient though is doing relatively well for this happening 4 days ago.  Discussed with patient about using the tramadol.  Patient would like to try to avoid it.  Patient has been taking ibuprofen and discussed monitoring for any type of bruising or abdominal pain.  Patient does have muscle relaxer if necessary at night.  Discussed the importance of taking deep breaths and patient does have an incentive spirometer.  We will get x-rays of the shoulder secondary to the bruising.  Follow-up again in 2-3 weeks  Known arthritic changes and would normally do injection but will hold at this time with patient wanting to heal a little better.  Patient will continue conservative therapy with the knee but will follow up in 3 weeks when hopefully will be able to do injections.  Update 06/02/2020 Sarah Castro is a 80 y.o. female coming in with complaint of L rib fx and L knee pain. States that everything is doing better. Left shoulder and glut pain today. Would like another injection on the right side.  Patient states that actually though the left shoulder seems to be giving her more difficulty than anything else.  Describes the pain as a dull, throbbing aching pain.  Patient states that if she tries to lift anything heavy she is feels like her arm does kind of  fall at this time.   Left knee known significant arthritic changes.   Acute mildly displaced rib fracture noted from numbers 3 through 5 as well as potentially 7.  CT scan  ASK ABOUT STOMACH PAINS  Past Medical History:  Diagnosis Date  . Arthritis   . Hypercholesteremia   . Hypertension   . PONV (postoperative nausea and vomiting)    Past Surgical History:  Procedure Laterality Date  . APPENDECTOMY    . broken back    . BUNIONECTOMY  2013  . CATARACT EXTRACTION     right  . [place]  2013  . HEMATOMA EVACUATION  5 years ago  . IR RADIOLOGIST EVAL & MGMT  12/04/2019  . KNEE ARTHROSCOPY     right  . LAPAROSCOPIC APPENDECTOMY N/A 10/24/2012   Procedure: APPENDECTOMY LAPAROSCOPIC;  Surgeon: Rolm Bookbinder, MD;  Location: [facility];  Service: General;  Laterality: N/A;  . LAPAROSCOPIC APPENDECTOMY    . POSTERIOR TIBIAL TENDON REPAIR  1998  . REPLACEMENT TOTAL KNEE Right   . TONSILLECTOMY   age 63  . 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 Marital status: Married    Spouse name: Not on file  . Number of children: Not on file  . Years of education: Not on file  . Highest education level: Not on file  Occupational History  . Not on file  Tobacco Use  . Smoking status: Former Smoker    Packs/day: 0.50    Years: 20.00    Pack years: 10.00    Types: Cigarettes    Quit date: 02/07/2011    Years since quitting: 9.3  . Smokeless tobacco: Never Used  Vaping Use  . Vaping Use: Never used  Substance and Sexual Activity  . Alcohol use: Yes    Alcohol/week: 7.0 standard drinks    Types: 7 Standard drinks or equivalent per week  . Drug use: No  . Sexual activity: Not on file  Other Topics Concern  . Not on file  Social History Narrative  . Not on file   Social Determinants of Health    Financial Resource Strain: Not on file  Food Insecurity: Not on file  Transportation Needs: Not on file  Physical Activity: Not on file  Stress: Not on file  Social Connections: Not on file   Allergies  Allergen Reactions  . Amlodipine Swelling  . Hydrocodone Nausea And Vomiting  . Sulfa Antibiotics Rash   Family History  Problem Relation Age of Onset  . Cancer Father        throat  . Cancer Sister        non hodgkins lymphoma  . Colon cancer Maternal Grandmother 52    Current Outpatient Medications (Endocrine & Metabolic):  .  calcitonin, salmon, (MIACALCIN/FORTICAL) 200 UNIT/ACT nasal spray, PLACE 1 SPRAY INTO ALTERNATE NOSTRILS DAILY   Current Outpatient Medications (Cardiovascular):  .  amLODipine (NORVASC) 5 MG tablet, Take 1 tablet (5 mg total) by mouth daily. .  hydrochlorothiazide (HYDRODIURIL) 25 MG tablet, Take 25 mg by mouth every morning. .  lovastatin (MEVACOR) 40 MG tablet, Take 20 mg by mouth at bedtime. .  metoprolol succinate (TOPROL-XL) 25 MG 24 hr tablet, Take 25 mg by mouth daily. Marland Kitchen  olmesartan (BENICAR) 40 MG tablet, Take 40 mg by mouth daily.         Current Outpatient Medications (Other):  Marland Kitchen  AMBULATORY NON FORMULARY MEDICATION, Rolling walker .  Calcium Carbonate-Vitamin D (CALCIUM 600 + D PO), Take 1 tablet by mouth 2 (two) times daily. .  cholecalciferol (VITAMIN D) 400 UNITS TABS, Take 400-800 Units by mouth 2 (two) times daily. Pt takes 400 mg at night. 800 mg qam .  fish oil-omega-3 fatty acids 1000 MG capsule, Take 2 g by mouth 2 (two) times daily. Marland Kitchen  gabapentin (NEURONTIN) 100 MG capsule, Take 1 capsule (100 mg total) by mouth at bedtime. .  Multiple Vitamins-Minerals (MULTIVITAMIN WITH MINERALS) tablet, Take 1 tablet by mouth daily. .  Multiple Vitamins-Minerals (OCUVITE PRESERVISION PO), Take 2 capsules by mouth daily. .  Red Yeast Rice Extract 600 MG CAPS, Take 600 mg by mouth 2 (two) times daily. .  Terbinafine HCl 1 % SOLN,  Spray to feet and nails once daily for foot fungus .  tizanidine (ZANAFLEX) 2 MG capsule, Take 1 capsule (2 mg total) by mouth at bedtime as needed for muscle spasms. Marland Kitchen  triamcinolone cream (KENALOG) 0.5 %, Apply 1 application topically 2 (two) times daily. To affected areas. .  TURMERIC PO, Take 500 mg by mouth 2 (two) times daily. .  Vitamin D, Ergocalciferol, (DRISDOL) 1.25 MG (50000 UT) CAPS capsule, TAKE 1 CAPSULE BY MOUTH EVERY 7 DAYS  Current Facility-Administered Medications (Other):  .  0.9 %  sodium chloride infusion   Reviewed prior external information including notes and imaging from  primary care provider As well as notes that were available from care everywhere and other healthcare systems.  Past medical history, social, surgical and family history all reviewed in electronic medical record.  No pertanent information unless stated regarding to the chief complaint.   Review of Systems:  No headache, visual changes, nausea, vomiting, diarrhea, constipation, dizziness, abdominal pain, skin rash, fevers, chills, night sweats, weight loss, swollen lymph nodes, joint swelling, chest pain, shortness of breath, mood changes. POSITIVE muscle aches, body aches  Objective  Blood pressure 140/78, pulse 60, height 5\' 2"  (1.575 m), weight 141 lb (64 kg), SpO2 97 %.   General: No apparent distress alert and oriented x3 mood and affect normal, dressed appropriately.  HEENT: Pupils unequal  Respiratory: Patient's speak in full sentences and does not appear short of breath  Gait severely antalgic MSK: Significant arthritic changes of the joints  Left shoulder exam does have positive impingement.  4-5 strength of the rotator cuff.  Mild positive empty can noted.  The patient does have some limited range of motion in most planes.   left rib area minimal tenderness noted.  Patient is able to take a deep breath.  Left knee exam does still have instability with valgus and varus force.  Tender  to palpation mostly over the medial joint line.  Trace effusion noted.  Limited range of motion in flexion and extension.  Left hip does have decreased range of motion.  Less tenderness than previous exam.  Negative fulcrum test.  Mild pain with internal rotation.  After informed written and verbal consent, patient was seated on exam table. Left shoulder was prepped with alcohol swab and utilizing posterior approach, patient's right glenohumeral space was injected with 4:1  marcaine 0.5%: Kenalog 40mg /dL. Patient tolerated the procedure well without immediate complications.   Impression and Recommendations:     The above documentation has been reviewed and is accurate and complete Lyndal Pulley, DO

## 2020-06-02 ENCOUNTER — Encounter: Payer: Self-pay | Admitting: Family Medicine

## 2020-06-02 ENCOUNTER — Other Ambulatory Visit: Payer: Self-pay

## 2020-06-02 ENCOUNTER — Ambulatory Visit (INDEPENDENT_AMBULATORY_CARE_PROVIDER_SITE_OTHER): Payer: Medicare PPO | Admitting: Family Medicine

## 2020-06-02 VITALS — BP 140/78 | HR 60 | Ht 62.0 in | Wt 141.0 lb

## 2020-06-02 DIAGNOSIS — G8929 Other chronic pain: Secondary | ICD-10-CM | POA: Diagnosis not present

## 2020-06-02 DIAGNOSIS — Z8673 Personal history of transient ischemic attack (TIA), and cerebral infarction without residual deficits: Secondary | ICD-10-CM | POA: Diagnosis not present

## 2020-06-02 DIAGNOSIS — R9089 Other abnormal findings on diagnostic imaging of central nervous system: Secondary | ICD-10-CM

## 2020-06-02 DIAGNOSIS — S2242XD Multiple fractures of ribs, left side, subsequent encounter for fracture with routine healing: Secondary | ICD-10-CM | POA: Diagnosis not present

## 2020-06-02 DIAGNOSIS — G5701 Lesion of sciatic nerve, right lower limb: Secondary | ICD-10-CM

## 2020-06-02 DIAGNOSIS — M25512 Pain in left shoulder: Secondary | ICD-10-CM | POA: Diagnosis not present

## 2020-06-02 NOTE — Assessment & Plan Note (Signed)
Patient at the time of her fall did have an abnormal MRI of the brain.  She would like referral to neurology which we will place today.

## 2020-06-02 NOTE — Assessment & Plan Note (Signed)
We will continue to monitor.  We will likely do a repeat injection at follow-up.

## 2020-06-02 NOTE — Patient Instructions (Addendum)
Good to see you Injected the shoulder Exercise 3 times a week See me again in 4-6 weeks

## 2020-06-02 NOTE — Assessment & Plan Note (Addendum)
Improving at this time.  Discussed posture of the neck, discussed which activities to do which wants to avoid.  Continue same medications.  Encouraged x-rays which patient declined with patient making improvement.

## 2020-06-02 NOTE — Assessment & Plan Note (Signed)
Left shoulder pain.  Patient does have mild positive empty can.  Patient given injection today.  Tolerated procedure well.  I do feel there is a concern for patient having a rotator cuff tear but secondary to her other comorbidities she would not be a surgical patient.  Could consider the possibility of formal physical therapy.  Patient would like to try to do the exercises on her own follow-up again in 6 weeks

## 2020-07-15 ENCOUNTER — Ambulatory Visit: Payer: Medicare PPO | Admitting: Sports Medicine

## 2020-07-19 NOTE — Progress Notes (Signed)
[place] [employer] Phone: [phone] Subjective:   [name], am serving as a scribe for Dr. Hulan Saas.  This visit occurred during the SARS-CoV-2 public health emergency.  Safety protocols were in place, including screening questions prior to the visit, additional usage of staff PPE, and extensive cleaning of exam room while observing appropriate contact time as indicated for disinfecting solutions.    I'm seeing this patient by the request  of:  Wenda Low, MD  CC: L shoulder, LBP rib pain follow up   YPP:[id number]  06/02/2020 Patient at the time of her fall did have an abnormal MRI of the brain.  She would like referral to neurology which we will place today.  We will continue to monitor.  We will likely do a repeat injection at follow-up.  Left shoulder pain.  Patient does have mild positive empty can.  Patient given injection today.  Tolerated procedure well.  I do feel there is a concern for patient having a rotator cuff tear but secondary to her other comorbidities she would not be a surgical patient.  Could consider the possibility of formal physical therapy.  Patient would like to try to do the exercises on her own follow-up again in 6 weeks  Improving at this time.  Discussed posture of the neck, discussed which activities to do which wants to avoid.  Continue same medications.  Encouraged x-rays which patient declined with patient making improvement.  Update 07/20/2020 MIKENA MASONER is a 80 y.o. female coming in with complaint of L shoulder, LBP, and rib pain. Patient states that her R lower back and glute is somewhat worse since last visit. Painful whne sitting in hard chair.   Unable to use L arm due to pain. Injection did help but has worn off.    Seeing neurology as well neurology scheduled for 6/16  Patient is accompanied with daughter  Past Medical History:  Diagnosis Date   Arthritis     Hypercholesteremia    Hypertension    PONV (postoperative nausea and vomiting)    Past Surgical History:  Procedure Laterality Date   APPENDECTOMY     broken back     BUNIONECTOMY  2013   CATARACT EXTRACTION     right   HAMMER TOE SURGERY  2013   HEMATOMA EVACUATION  5 years ago   IR RADIOLOGIST EVAL & MGMT  12/04/2019   KNEE ARTHROSCOPY     right   LAPAROSCOPIC APPENDECTOMY N/A 10/24/2012   Procedure: APPENDECTOMY LAPAROSCOPIC;  Surgeon: Rolm Bookbinder, MD;  Location: [place];  Service: General;  Laterality: N/A;   [place]   REPLACEMENT TOTAL KNEE Right    TONSILLECTOMY   age 15   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   Procedure: TOTAL KNEE ARTHROPLASTY;  Surgeon: Mauri Pole, MD;  Location: [facility];  Service: Orthopedics;  Laterality: Right;   Social History   Socioeconomic History   Marital status: Married    Spouse name: Not on file   Number of children: Not on file   Years of education: Not on file   Highest education level: Not on file  Occupational History   Not on file  Tobacco Use   Smoking status: Former    Packs/day: 0.50    Years: 20.00    Pack years: 10.00    Types: Cigarettes    Quit date: 02/07/2011  Years since quitting: 9.4   Smokeless tobacco: Never  Vaping Use   Vaping Use: Never used  Substance and Sexual Activity   Alcohol use: Yes    Alcohol/week: 7.0 standard drinks    Types: 7 Standard drinks or equivalent per week   Drug use: No   Sexual activity: Not on file  Other Topics Concern   Not on file  Social History Narrative   Not on file   Social Determinants of Health   Financial Resource Strain: Not on file  Food Insecurity: Not on file  Transportation Needs: Not on file  Physical Activity: Not on file  Stress: Not on file  Social Connections: Not on file   Allergies  Allergen Reactions   Amlodipine Swelling   Hydrocodone Nausea And Vomiting   Sulfa  Antibiotics Rash   Family History  Problem Relation Age of Onset   Cancer Father        throat   Cancer Sister        non hodgkins lymphoma   Colon cancer Maternal Grandmother 91    Current Outpatient Medications (Endocrine & Metabolic):    calcitonin, salmon, (MIACALCIN/FORTICAL) 200 UNIT/ACT nasal spray, PLACE 1 SPRAY INTO ALTERNATE NOSTRILS DAILY   Current Outpatient Medications (Cardiovascular):    amLODipine (NORVASC) 5 MG tablet, Take 1 tablet (5 mg total) by mouth daily.   hydrochlorothiazide (HYDRODIURIL) 25 MG tablet, Take 25 mg by mouth every morning.   lovastatin (MEVACOR) 40 MG tablet, Take 20 mg by mouth at bedtime.   metoprolol succinate (TOPROL-XL) 25 MG 24 hr tablet, Take 25 mg by mouth daily.   olmesartan (BENICAR) 40 MG tablet, Take 40 mg by mouth daily.         Current Outpatient Medications (Other):    AMBULATORY NON FORMULARY MEDICATION, Rolling walker   Calcium Carbonate-Vitamin D (CALCIUM 600 + D PO), Take 1 tablet by mouth 2 (two) times daily.   cholecalciferol (VITAMIN D) 400 UNITS TABS, Take 400-800 Units by mouth 2 (two) times daily. Pt takes 400 mg at night. 800 mg qam   fish oil-omega-3 fatty acids 1000 MG capsule, Take 2 g by mouth 2 (two) times daily.   gabapentin (NEURONTIN) 100 MG capsule, Take 1 capsule (100 mg total) by mouth at bedtime.   Multiple Vitamins-Minerals (MULTIVITAMIN WITH MINERALS) tablet, Take 1 tablet by mouth daily.   Multiple Vitamins-Minerals (OCUVITE PRESERVISION PO), Take 2 capsules by mouth daily.   Red Yeast Rice Extract 600 MG CAPS, Take 600 mg by mouth 2 (two) times daily.   Terbinafine HCl 1 % SOLN, Spray to feet and nails once daily for foot fungus   tizanidine (ZANAFLEX) 2 MG capsule, Take 1 capsule (2 mg total) by mouth at bedtime as needed for muscle spasms.   triamcinolone cream (KENALOG) 0.5 %, Apply 1 application topically 2 (two) times daily. To affected areas.   TURMERIC PO, Take 500 mg by mouth 2 (two)  times daily.   Vitamin D, Ergocalciferol, (DRISDOL) 1.25 MG (50000 UT) CAPS capsule, TAKE 1 CAPSULE BY MOUTH EVERY 7 DAYS  Current Facility-Administered Medications (Other):    0.9 %  sodium chloride infusion    Review of Systems:  No headache, visual changes, nausea, vomiting, diarrhea, constipation, dizziness, abdominal pain, skin rash, fevers, chills, night sweats, weight loss, swollen lymph nodes,  chest pain, shortness of breath, mood changes. POSITIVE muscle aches, body aches, joint swelling and leg swelling  Objective  Blood pressure (!) 158/80, pulse 68, height 5\' 2"  (  1.575 m), weight 140 lb (63.5 kg), SpO2 99 %.   General: No apparent distress alert and oriented x3 mood and affect normal, dressed appropriately.  Respiratory: Patient's speak in full sentences and does not appear short of breath  Cardiovascular: 2+ lower extremity edema, non tender, no erythema  Gait antalgic using a cane MSK: Patient has difficulty with balance.  Pain on noted in the paraspinal musculature of the lumbar spine.  Patient also has severe pain in the right piriformis area again.  Difficulty with Corky Sox secondary to pain. Patient's left shoulder does have weakness noted on the rotator cuff and empty can.  Some mild improvement though in range of motion from previous exam.  Patient is neurovascular intact distally.  Contralateral arm shows severe abnormality noted of the right elbow.  Procedure: Real-time Ultrasound Guided Injection of right piriformis tendon sheath Device: GE Logiq Q7 Ultrasound guided injection is preferred based studies that show increased duration, increased effect, greater accuracy, decreased procedural pain, increased response rate, and decreased cost with ultrasound guided versus blind injection.  Verbal informed consent obtained.  Time-out conducted.  Noted no overlying erythema, induration, or other signs of local infection.  Skin prepped in a sterile fashion.  Local anesthesia:  Topical Ethyl chloride.  With sterile technique and under real time ultrasound guidance: With a 21-gauge 2 inch needle injected into the right piriformis with a total of 2 cc of 0.5% Marcaine and 1 cc of Kenalog 40 mg/mL.  No blood loss.  Patient was able to ambulate after the injection. Completed without difficulty  Pain immediately improved suggesting accurate placement of the medication.  Advised to call if fevers/chills, erythema, induration, drainage, or persistent bleeding.  Impression: Technically successful ultrasound guided injection.   Impression and Recommendations:     The above documentation has been reviewed and is accurate and complete Lyndal Pulley, DO

## 2020-07-20 ENCOUNTER — Encounter: Payer: Self-pay | Admitting: Family Medicine

## 2020-07-20 ENCOUNTER — Ambulatory Visit: Payer: Self-pay

## 2020-07-20 ENCOUNTER — Ambulatory Visit (INDEPENDENT_AMBULATORY_CARE_PROVIDER_SITE_OTHER): Payer: Medicare PPO | Admitting: Family Medicine

## 2020-07-20 ENCOUNTER — Other Ambulatory Visit: Payer: Self-pay

## 2020-07-20 VITALS — BP 158/80 | HR 68 | Ht 62.0 in | Wt 140.0 lb

## 2020-07-20 DIAGNOSIS — M25551 Pain in right hip: Secondary | ICD-10-CM

## 2020-07-20 DIAGNOSIS — M25512 Pain in left shoulder: Secondary | ICD-10-CM | POA: Diagnosis not present

## 2020-07-20 DIAGNOSIS — G5701 Lesion of sciatic nerve, right lower limb: Secondary | ICD-10-CM

## 2020-07-20 DIAGNOSIS — M545 Low back pain, unspecified: Secondary | ICD-10-CM

## 2020-07-20 DIAGNOSIS — G8929 Other chronic pain: Secondary | ICD-10-CM | POA: Diagnosis not present

## 2020-07-20 NOTE — Assessment & Plan Note (Signed)
I do believe the patient does have a partial tear of the rotator cuff.  Patient though is not a candidate for any surgical intervention.  Discussed the potential for advanced imaging but do not think it would change management.  We will consider the possibility of repeat injection at follow-up.  Follow-up again in 6 weeks

## 2020-07-20 NOTE — Assessment & Plan Note (Signed)
Patient given injection.  Patient declined another epidural.  Felt like she was never making any significant improvement.  Discussed which activities to do which wants to avoid.  Increase activity slowly.  Follow-up with me again 6 to 8 weeks

## 2020-07-20 NOTE — Patient Instructions (Signed)
Injected hip today See me again in 5 weeks for shoulder injection

## 2020-07-22 ENCOUNTER — Encounter: Payer: Self-pay | Admitting: Neurology

## 2020-07-22 ENCOUNTER — Ambulatory Visit (INDEPENDENT_AMBULATORY_CARE_PROVIDER_SITE_OTHER): Payer: Medicare PPO | Admitting: Neurology

## 2020-07-22 VITALS — BP 147/67 | HR 60 | Ht 62.0 in | Wt 140.0 lb

## 2020-07-22 DIAGNOSIS — M4807 Spinal stenosis, lumbosacral region: Secondary | ICD-10-CM | POA: Diagnosis not present

## 2020-07-22 DIAGNOSIS — I679 Cerebrovascular disease, unspecified: Secondary | ICD-10-CM | POA: Insufficient documentation

## 2020-07-22 DIAGNOSIS — R269 Unspecified abnormalities of gait and mobility: Secondary | ICD-10-CM | POA: Diagnosis not present

## 2020-07-22 DIAGNOSIS — M5416 Radiculopathy, lumbar region: Secondary | ICD-10-CM

## 2020-07-22 NOTE — Progress Notes (Signed)
Chief Complaint  Patient presents with   New Patient (Initial Visit)    She is here with her daughter, Marita [place]. Referred for abnormal gait and frequent falls. She went to the ED on 05/25/20 with injuries (reports four broken ribs and left rotator cuff tear). Recent MRI brain shows lacunar infarct.      ASSESSMENT AND PLAN  RYVER ZADROZNY is a 80 y.o. female  Multiple falls, significant progressive worsening gait abnormality  Multiple abnormal CT cervical, thoracic lumbar spine abnormalities.  Differentiation diagnosis include cervical/thoracic myelopathy, with superimposed lumbar radiculopathy  Proceed with MRI of cervical, lumbar spine  EMG/NCS.  Cerebra. vascular disease  After discussed with patient, want to complete evaluation with echocardiogram, ultrasound of carotid artery  Vascular risk factor of aging, hypertension, hyperlipidemia, high risk of fall, suggest her keep well hydration    DIAGNOSTIC DATA (LABS, IMAGING, TESTING) - I reviewed patient records, labs, notes, testing and imaging myself where available.  MRI on May 08 2020 1. No acute intracranial abnormality. 2. Atrophy and white matter disease is moderately advanced for age. This likely reflects the sequela of chronic microvascular ischemia. 3. 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  Left shoulder x ray 1. Trace degenerative glenoid spurring. 2. Calcification adjacent to the lesser trochanter which may represent calcific tendinopathy. 3. Left rib fractures which were seen on recent CT.   Pelvic x ray 1. No evidence of acute pelvis or left hip fracture. 2. Mild degenerative acetabular spurring.   HISTORICAL  Sarah Castro is a 80 year old female, seen in request by her primary care physician Dr. Wenda Low, for evaluation of gait abnormality, frequent fall, she is accompanied by her daughter Marita [place] at today's visit on July 22, 2020    I reviewed and summarized the referring  note. PMHx. 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 HTN Depression, Anxiety, on lexapro 10mg  daily HLD. Right elbow injury  did not see steps in 2019, broken, soft cast,  few weeks later, moved, healed wrong, have significant deformity right elbow  Patient had gradual onset gait abnormality for more than 6 years, initially contributed to mobility problem, chronic low back pain, she did have right knee replacement in 2016, still has left knee pain, receiving cortisone shots,  She graduated depend on protein since 2019, reported worsening low back pain, also radiating pain towards his right leg, right leg seems to give him more trouble,  She fell again in October 2021, I personally reviewed CT thoracic/lumbar spine, 2013 2021 mild compression fracture of T10, moderate compression fracture of T11 is well defined fracture line and intraosseous hematoma mild retropulsion of bone into the canal without significant stenosis from the fracture appears to be subacute or acute, severe compression fracture of T12, favor progression of fracture which was present in 2020, bony retropulsion with 25% stenosis of the canal, moderate compression fracture L1 that was acute, only retropulsion, superior endplate is 90% diameter stenosis  Multilevel lumbar degenerative changes 10 mm anterolisthesis L4-5 with severe subarticular stenosis on the right, and moderate right foraminal stenosis, moderate central canal stenosis.   Emergency room presentation May 08, 2020, fell when she turned her head in the bathroom, landed on the left shoulder, personally reviewed CT head without contrast no acute intracranial abnormality, CT of cervical spine moderate spondylosis of cervical spine with moderate multilevel disc disease, multilevel bilateral neural foraminal narrowing,  MRI of the brain showed no acute intracranial abnormality, generalized atrophy advanced supratentorium small vessel disease, remote  lacunar infarction  left basal ganglia  PHYSICAL EXAM:   Vitals:   07/22/20 0944  BP: (!) 147/67  Pulse: 60  Weight: 140 lb (63.5 kg)  Height: 5\' 2"  (1.575 m)   Not recorded     Body mass index is 25.61 kg/m.  PHYSICAL EXAMNIATION:  Gen: NAD, conversant, well nourised, well groomed                     Cardiovascular: Regular rate rhythm, no peripheral edema, warm, nontender. Eyes: Conjunctivae clear without exudates or hemorrhage Neck: Supple, no carotid bruits. Pulmonary: Clear to auscultation bilaterally   NEUROLOGICAL EXAM:  MENTAL STATUS: Speech:    Speech is normal; fluent and spontaneous with normal comprehension.  Cognition:     Orientation to time, place and person     Normal recent and remote memory     Normal Attention span and concentration     Normal Language, naming, repeating,spontaneous speech     Fund of knowledge   CRANIAL NERVES: CN II: Visual fields are full to confrontation. Pupils are round equal and briskly reactive to light. CN III, IV, VI: extraocular movement are normal. No ptosis. CN V: Facial sensation is intact to light touch CN VII: Face is symmetric with normal eye closure  CN VIII: Hearing is normal to causal conversation. CN IX, X: Phonation is normal. CN XI: Head turning and shoulder shrug are intact  MOTOR: Significant deformity of the right elbow limited range of motion, examination is also limited by right shoulder, there are antigravity movement of bilateral proximal upper extremity.  Normal bilateral distal upper extremity muscle strengths, she has mild bilateral hip flexion weakness, moderate right more than left ankle dorsiflexion/toe extension weakness  REFLEXES: Reflexes are 2+ and symmetric at the biceps, triceps, absent at the knees, and ankles. Plantar responses are extensor bilaterally   SENSORY: Length dependent decreased to light touch, pinprick and vibratory sensation to distal shin  COORDINATION: There is no trunk or limb  dysmetria noted.  GAIT/STANCE: She needs push-up to get up from seated position, very unsteady dragging right leg  REVIEW OF SYSTEMS:  Full 14 system review of systems performed and notable only for as above All other review of systems were negative.   ALLERGIES: Allergies  Allergen Reactions   Amlodipine Swelling   Hydrocodone Nausea And Vomiting   Sulfa Antibiotics Rash    HOME MEDICATIONS: Current Outpatient Medications  Medication Sig Dispense Refill   AMBULATORY NON FORMULARY MEDICATION Rolling walker 1 Units 0   amLODipine (NORVASC) 5 MG tablet Take 1 tablet (5 mg total) by mouth daily. 30 tablet 0   Calcium Carbonate-Vitamin D (CALCIUM 600 + D PO) Take 1 tablet by mouth 2 (two) times daily.     cholecalciferol (VITAMIN D) 400 UNITS TABS Take 400-800 Units by mouth 2 (two) times daily. Pt takes 400 mg at night. 800 mg qam     escitalopram (LEXAPRO) 10 MG tablet Take 1 tablet by mouth daily.     fish oil-omega-3 fatty acids 1000 MG capsule Take 2 g by mouth 2 (two) times daily.     hydrochlorothiazide (HYDRODIURIL) 25 MG tablet Take 25 mg by mouth every morning.     lovastatin (MEVACOR) 40 MG tablet Take 20 mg by mouth at bedtime.     metoprolol succinate (TOPROL-XL) 25 MG 24 hr tablet Take 25 mg by mouth daily.     Multiple Vitamins-Minerals (MULTIVITAMIN WITH MINERALS) tablet Take 1 tablet by mouth daily.  Multiple Vitamins-Minerals (OCUVITE PRESERVISION PO) Take 2 capsules by mouth daily.     olmesartan (BENICAR) 40 MG tablet Take 40 mg by mouth daily.     Red Yeast Rice Extract 600 MG CAPS Take 600 mg by mouth 2 (two) times daily.     Terbinafine HCl 1 % SOLN Spray to feet and nails once daily for foot fungus 125 mL 2   triamcinolone cream (KENALOG) 0.5 % Apply 1 application topically 2 (two) times daily. To affected areas. 30 g 3   TURMERIC PO Take 500 mg by mouth 2 (two) times daily.     Vitamin D, Ergocalciferol, (DRISDOL) 1.25 MG (50000 UT) CAPS capsule TAKE 1  CAPSULE BY MOUTH EVERY 7 DAYS 12 capsule 0   No current facility-administered medications for this visit.    PAST MEDICAL HISTORY: Past Medical History:  Diagnosis Date   Arthritis    Depression with anxiety    Hypercholesteremia    Hypertension    Macular degeneration    PONV (postoperative nausea and vomiting)    Stroke [facility])     PAST SURGICAL HISTORY: Past Surgical History:  Procedure Laterality Date   APPENDECTOMY     broken back     BUNIONECTOMY  2013   CATARACT EXTRACTION     right   HAMMER TOE SURGERY  2013   HEMATOMA EVACUATION  5 years ago   IR RADIOLOGIST EVAL & MGMT  12/04/2019   KNEE ARTHROSCOPY     right   LAPAROSCOPIC APPENDECTOMY N/A 10/24/2012   Procedure: APPENDECTOMY LAPAROSCOPIC;  Surgeon: Rolm Bookbinder, MD;  Location: [place];  Service: General;  Laterality: N/A;   LAPAROSCOPIC APPENDECTOMY     POSTERIOR TIBIAL TENDON REPAIR  1998   REPLACEMENT TOTAL KNEE Right    TONSILLECTOMY   age 15   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   Procedure: TOTAL KNEE ARTHROPLASTY;  Surgeon: Mauri Pole, MD;  Location: [facility];  Service: Orthopedics;  Laterality: Right;    FAMILY HISTORY: Family History  Problem Relation Age of Onset   Hypertension Mother    Hypertension Father    Cancer Father        throat   Throat cancer Father    Cancer Sister        non hodgkins lymphoma   Non-Hodgkin's lymphoma Sister    Non-Hodgkin's lymphoma Brother    Colon cancer Maternal Grandmother 68   Prostate cancer Maternal Uncle     SOCIAL HISTORY: Social History   Socioeconomic History   Marital status: Married    Spouse name: Not on file   Number of children: 2   Years of education: college   Highest education level: Bachelor's degree (e.g., BA, AB, BS)  Occupational History   Occupation: Retired  Tobacco Use   Smoking status: Former    Packs/day: 0.50    Years: 20.00    Pack years: 10.00    Types: Cigarettes    Quit date: 02/07/2011    Years since  quitting: 9.4   Smokeless tobacco: Never  Vaping Use   Vaping Use: Never used  Substance and Sexual Activity   Alcohol use: Not Currently    Comment: 2-3 glass of wine or vodka per day   Drug use: Never   Sexual activity: Not on file  Other Topics Concern   Not on file  Social History Narrative   Lives alone.   Left-handed.   One cup caffeine per day.   Social Determinants of Health  Financial Resource Strain: Not on file  Food Insecurity: Not on file  Transportation Needs: Not on file  Physical Activity: Not on file  Stress: Not on file  Social Connections: Not on file  Intimate Partner Violence: Not on file    Total time spent reviewing the chart, obtaining history, examined patient, ordering tests, documentation, consultations and family, care coordination was 17 minutes    Marcial Pacas, M.D. Ph.D.  [facility] [address], [place], [place] [address] Ph: [phone] Fax: [phone]  CC:  Wenda Low, [address] [place],  [place] [address]  Wenda Low, MD

## 2020-07-27 ENCOUNTER — Telehealth: Payer: Self-pay | Admitting: Neurology

## 2020-07-27 ENCOUNTER — Telehealth: Payer: Self-pay

## 2020-07-27 NOTE — Telephone Encounter (Signed)
MRI cervical & MRI lumbar spine approved by [facility]. PA #[id number] (07/27/20- 08/26/20). Sent orders to [place], they will call patient to schedule. Phone: [phone].

## 2020-07-27 NOTE — Telephone Encounter (Signed)
Referral from home health sent to [facility]. P: [phone]

## 2020-07-28 NOTE — Telephone Encounter (Signed)
Please call patient or her other contact, check with her why she refused the test suggested

## 2020-07-28 NOTE — Telephone Encounter (Signed)
Per [place] scheduling departments, patient has declined to schedule all tests ordered for her. This includes echo, doppler, and MRIs.

## 2020-07-29 NOTE — Telephone Encounter (Signed)
The patient had the following ordered: MRI cervical, MRI lumbar, NCV/EMG, ECHO, US carotid.  I called and offered to assist getting her scheduled. She still declined stating "I'm just too old".  I explained the rationale behind the ordered tests based on her medical history and symptoms. I also told her the results will help Dr. Krista Blue make the best medical decisions to keep her in as healthy as possible.  She told me she would think it over and call back if she would like to proceed with the plan.

## 2020-08-02 NOTE — Telephone Encounter (Signed)
Please call her daughter, she needs to have MRI cervical, lumbar and EMG/NCS  As priority to figure out why she fell so much. Previous CT cervical, thoracic, lumbar spine showed multiple pathology,  It is Ok to hold off ECHO and US carotid for now.

## 2020-08-02 NOTE — Telephone Encounter (Signed)
I called the patient's daughter on DPR and discussed Dr. Rhea Belton response with her. Also, reviewed the reasoning for the tests. She will discuss this with her mother and call back if she agrees to proceed.

## 2020-08-10 ENCOUNTER — Telehealth: Payer: Self-pay | Admitting: Neurology

## 2020-08-10 DIAGNOSIS — F411 Generalized anxiety disorder: Secondary | ICD-10-CM | POA: Diagnosis not present

## 2020-08-10 DIAGNOSIS — M8580 Other specified disorders of bone density and structure, unspecified site: Secondary | ICD-10-CM | POA: Diagnosis not present

## 2020-08-10 DIAGNOSIS — I10 Essential (primary) hypertension: Secondary | ICD-10-CM | POA: Diagnosis not present

## 2020-08-10 DIAGNOSIS — I872 Venous insufficiency (chronic) (peripheral): Secondary | ICD-10-CM | POA: Diagnosis not present

## 2020-08-10 DIAGNOSIS — M5090 Cervical disc disorder, unspecified, unspecified cervical region: Secondary | ICD-10-CM | POA: Diagnosis not present

## 2020-08-10 DIAGNOSIS — E782 Mixed hyperlipidemia: Secondary | ICD-10-CM | POA: Diagnosis not present

## 2020-08-10 DIAGNOSIS — M5136 Other intervertebral disc degeneration, lumbar region: Secondary | ICD-10-CM | POA: Diagnosis not present

## 2020-08-10 DIAGNOSIS — I700 Atherosclerosis of aorta: Secondary | ICD-10-CM | POA: Diagnosis not present

## 2020-08-10 DIAGNOSIS — R7303 Prediabetes: Secondary | ICD-10-CM | POA: Diagnosis not present

## 2020-08-10 NOTE — Telephone Encounter (Signed)
Pt is requesting a call back, has some information she would like to relay to Doctor.

## 2020-08-10 NOTE — Telephone Encounter (Signed)
I have called patient, she does need MRI of cervical, lumbar,   Please call her for MRI schedules,  Will hold EMG nerve conduction study now

## 2020-08-10 NOTE — Telephone Encounter (Signed)
She reports seeing her PCP, Dr. Wenda Low, today. Per the patient, he instructed her to hold off on the ordered MRI scans and NCV/EMG for now. She says he told her to try physical therapy first. She canceled her appts.

## 2020-08-10 NOTE — Telephone Encounter (Signed)
Pt originally declined MRI, please re-auth and schedule. Thanks.

## 2020-08-18 ENCOUNTER — Encounter: Payer: Medicare PPO | Admitting: Neurology

## 2020-08-23 ENCOUNTER — Other Ambulatory Visit: Payer: Self-pay

## 2020-08-23 ENCOUNTER — Ambulatory Visit
Admission: RE | Admit: 2020-08-23 | Discharge: 2020-08-23 | Disposition: A | Discharge status: Home / Self Care | Payer: Medicare PPO | Source: Ambulatory Visit | Attending: Neurology | Admitting: Neurology

## 2020-08-23 DIAGNOSIS — M4807 Spinal stenosis, lumbosacral region: Secondary | ICD-10-CM | POA: Diagnosis not present

## 2020-08-23 DIAGNOSIS — M48061 Spinal stenosis, lumbar region without neurogenic claudication: Secondary | ICD-10-CM | POA: Diagnosis not present

## 2020-08-23 DIAGNOSIS — R269 Unspecified abnormalities of gait and mobility: Secondary | ICD-10-CM

## 2020-08-23 DIAGNOSIS — I679 Cerebrovascular disease, unspecified: Secondary | ICD-10-CM

## 2020-08-23 DIAGNOSIS — M5416 Radiculopathy, lumbar region: Secondary | ICD-10-CM

## 2020-08-24 ENCOUNTER — Telehealth: Payer: Self-pay | Admitting: Neurology

## 2020-08-24 NOTE — Telephone Encounter (Signed)
  IMPRESSION:   This MRI of the cervical spine without contrast shows the following: 1.   The spinal cord has normal signal. 2.   Mild generalized cortical atrophy and mild chronic microvascular ischemic changes noted in the pons. 3.   At C4-C5, there is mild spinal stenosis due to minimal anterolisthesis and other degenerative change.  There is no nerve root compression. 4.   At C5-C6, there is moderate spinal stenosis due to retrolisthesis and other degenerative changes.  There is also moderately severe left and moderate right foraminal narrowing with potential for left C6 nerve root compression. 5.   At C6-C7, there is mild spinal stenosis due to degenerative changes causing moderate foraminal narrowing but no nerve root compression. 6.   Degenerative changes are also noted at C2-C3, C3-C4, C7-T1 and T1-T2 though there is no significant spinal stenosis or nerve root compression at these levels.   IMPRESSION: This MRI of the lumbar spine without contrast shows the following: 1.  Compression fractures of the T11, T12 and L1 vertebral bodies that have all occurred since the 08/13/2017 MRI.  These do not appear to be acute.  There is some posterior protrusion of the T12 and L1 vertebral bodies into the central canal causing mild spinal stenosis but no nerve root compression. 2.   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   3.   At L2-L3, there is mild spinal stenosis due to prominent epidural fat that has increased compared to the 2019 MRI.  There are stable mild degenerative changes.  There is no nerve root compression. 4.   At L3-L4, there is severe spinal stenosis due to very prominent posterior epidural fat and degenerative changes, only slightly progressed compared to the 2019 MRI.  There is moderate right foraminal narrowing that could be associated with right L3 nerve root compression. 5.   At L4-L5, there is 10 mm anterolisthesis, other degenerative changes and prominent epidural fat  combining to cause severe spinal stenosis, moderate foraminal narrowing and moderately severe right and moderate left lateral recess stenosis.  There is potential for right L5 nerve root compression.  Changes are stable compared to the 2019 MRI. 6.   At L5-S1, there is severe spinal stenosis due to epidural fat, increased compared to the 2019 MRI, and facet hypertrophy.  There is no nerve root compression.  Please call patient, MRI of cervical spine showed multilevel degenerative disc disease, most noticeable at C5-6, has moderate spinal stenosis, with variable foraminal narrowing, there was no evidence of spinal cord signal abnormality  MRI of lumbar spine showed multilevel degenerative changes, severe spinal stenosis at L 3 4, L4-5, L5-S1, progressed since 2019  Above findings would explain her gait abnormalities, I would suggest her come back for EMG nerve conduction study to complete the evaluation, if she do decided to come for the test, bring her family, so we can go over to films, and findings together

## 2020-08-25 NOTE — Telephone Encounter (Signed)
I spoke to the patient and provided her with the lumbar, cervical MRI results. She verbalized understanding. She is going to talk things over with her daughter and discuss the recommended NCV/EMG. She declined to schedule at this time but will call us back once she decides how she would like to proceed.

## 2020-08-25 NOTE — Progress Notes (Signed)
Sarah Castro Phone: [phone] Subjective:    I'm seeing this patient by the request  of:  Wenda Low, MD  I, Peterson Lombard, LAT, ATC acting as a scribe for Charlann Boxer, DO.  CC: Shoulder pain, back pain and hip pain follow-up  [id number]  07/20/2020 I do believe the patient does have a partial tear of the rotator cuff.  Patient though is not a candidate for any surgical intervention.  Discussed the potential for advanced imaging but do not think it would change management.  We will consider the possibility of repeat injection at follow-up.  Follow-up again in 6 weeks  Patient given injection.  Patient declined another epidural.  Felt like she was never making any significant improvement.  Discussed which activities to do which wants to avoid.  Increase activity slowly.  Follow-up with me again 6 to 8 weeks  Update 08/26/2020 Sarah Castro is a 80 y.o. female coming in with complaint of L shoulder and R hip pain. Patient states L shoulder is much improved. Pt reports R hip is bothering her when walking. Improved w/ IBU. Patient states that she was able to go to the beach and did relatively well.  If she does not take the ibuprofen the pain seems to come back.  Patient did see the neurologist to get his new MRIs of the back.  This was independently visualized by me showing the patient did have the compression fractures that were known previously as well as severe spinal stenosis mostly at the full lumbar spine.  Discussed with patient she is still considering doing a nerve conduction test.       Past Medical History:  Diagnosis Date   Arthritis    Depression with anxiety    Hypercholesteremia    Hypertension    Macular degeneration    PONV (postoperative nausea and vomiting)    Stroke [facility]    Past Surgical History:  Procedure Laterality Date   APPENDECTOMY     broken back     BUNIONECTOMY  2013    CATARACT EXTRACTION     right   HAMMER TOE SURGERY  2013   HEMATOMA EVACUATION  5 years ago   IR RADIOLOGIST EVAL & MGMT  12/04/2019   KNEE ARTHROSCOPY     right   LAPAROSCOPIC APPENDECTOMY N/A 10/24/2012   Procedure: APPENDECTOMY LAPAROSCOPIC;  Surgeon: Rolm Bookbinder, MD;  Location: [facility];  Service: General;  Laterality: N/A;   [address]   REPLACEMENT TOTAL KNEE Right    TONSILLECTOMY   age 44   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   Procedure: TOTAL KNEE ARTHROPLASTY;  Surgeon: Mauri Pole, MD;  Location: [facility];  Service: Orthopedics;  Laterality: Right;   Social History   Socioeconomic History   Marital status: Married    Spouse name: Not on file   Number of children: 2   Years of education: college   Highest education level: Bachelor's degree (e.g., BA, AB, BS)  Occupational History   Occupation: Retired  Tobacco Use   Smoking status: Former    Packs/day: 0.50    Years: 20.00    Pack years: 10.00    Types: Cigarettes    Quit date: 02/07/2011    Years since quitting: 9.5   Smokeless tobacco: Never  Vaping Use   Vaping Use: Never used  Substance and Sexual Activity  Alcohol use: Not Currently    Comment: 2-3 glass of wine or vodka per day   Drug use: Never   Sexual activity: Not on file  Other Topics Concern   Not on file  Social History Narrative   Lives alone.   Left-handed.   One cup caffeine per day.   Social Determinants of Health   Financial Resource Strain: Not on file  Food Insecurity: Not on file  Transportation Needs: Not on file  Physical Activity: Not on file  Stress: Not on file  Social Connections: Not on file   Allergies  Allergen Reactions   Amlodipine Swelling   Hydrocodone Nausea And Vomiting   Sulfa Antibiotics Rash   Family History  Problem Relation Age of Onset   Hypertension Mother    Hypertension Father    Cancer Father        throat   Throat cancer Father     Cancer Sister        non hodgkins lymphoma   Non-Hodgkin's lymphoma Sister    Non-Hodgkin's lymphoma Brother    Colon cancer Maternal Grandmother 109   Prostate cancer Maternal Uncle      Current Outpatient Medications (Cardiovascular):    amLODipine (NORVASC) 5 MG tablet, Take 1 tablet (5 mg total) by mouth daily.   hydrochlorothiazide (HYDRODIURIL) 25 MG tablet, Take 25 mg by mouth every morning.   lovastatin (MEVACOR) 40 MG tablet, Take 20 mg by mouth at bedtime.   metoprolol succinate (TOPROL-XL) 25 MG 24 hr tablet, Take 25 mg by mouth daily.   olmesartan (BENICAR) 40 MG tablet, Take 40 mg by mouth daily.     Current Outpatient Medications (Other):    AMBULATORY NON FORMULARY MEDICATION, Rolling walker   Calcium Carbonate-Vitamin D (CALCIUM 600 + D PO), Take 1 tablet by mouth 2 (two) times daily.   cholecalciferol (VITAMIN D) 400 UNITS TABS, Take 400-800 Units by mouth 2 (two) times daily. Pt takes 400 mg at night. 800 mg qam   escitalopram (LEXAPRO) 10 MG tablet, Take 1 tablet by mouth daily.   fish oil-omega-3 fatty acids 1000 MG capsule, Take 2 g by mouth 2 (two) times daily.   Multiple Vitamins-Minerals (MULTIVITAMIN WITH MINERALS) tablet, Take 1 tablet by mouth daily.   Multiple Vitamins-Minerals (OCUVITE PRESERVISION PO), Take 2 capsules by mouth daily.   Red Yeast Rice Extract 600 MG CAPS, Take 600 mg by mouth 2 (two) times daily.   Terbinafine HCl 1 % SOLN, Spray to feet and nails once daily for foot fungus   triamcinolone cream (KENALOG) 0.5 %, Apply 1 application topically 2 (two) times daily. To affected areas.   TURMERIC PO, Take 500 mg by mouth 2 (two) times daily.   Vitamin D, Ergocalciferol, (DRISDOL) 1.25 MG (50000 UT) CAPS capsule, TAKE 1 CAPSULE BY MOUTH EVERY 7 DAYS   Reviewed prior external information including notes and imaging from  primary care provider reviewed the imaging from the MRI of the lumbar spine and the cervical spine today. As well as  notes that were available from care everywhere and other healthcare systems.  Past medical history, social, surgical and family history all reviewed in electronic medical record.  No pertanent information unless stated regarding to the chief complaint.   Review of Systems:  No headache, visual changes, nausea, vomiting, diarrhea, constipation, dizziness, abdominal pain, skin rash, fevers, chills, night sweats, weight loss, swollen lymph nodes, joint swelling, chest pain, shortness of breath, mood changes. POSITIVE muscle aches, body aches  Objective  Blood pressure (!) 148/88, pulse 60, weight 140 lb (63.5 kg), SpO2 97 %.   General: No apparent distress alert and oriented x3 mood and affect normal, dressed appropriately.  Patient is accompanied with her daughter. HEENT: Patient is blind in the right eye. Respiratory: Patient's speak in full sentences and does not appear short of breath  Cardiovascular: No lower extremity edema, non tender, no erythema  Gait n antalgic using a cane for walking. Tightness noted in the piriformis on the right side but not as severe as it has been in the past.  Patient has tightness with straight leg test.  Does have some atrophy of the musculature of the lower extremities bilaterally.   Impression and Recommendations:    The above documentation has been reviewed and is accurate and complete Lyndal Pulley, DO

## 2020-08-26 ENCOUNTER — Other Ambulatory Visit: Payer: Self-pay

## 2020-08-26 ENCOUNTER — Ambulatory Visit (INDEPENDENT_AMBULATORY_CARE_PROVIDER_SITE_OTHER): Payer: Medicare PPO | Admitting: Family Medicine

## 2020-08-26 ENCOUNTER — Ambulatory Visit: Payer: Self-pay

## 2020-08-26 ENCOUNTER — Encounter: Payer: Self-pay | Admitting: Family Medicine

## 2020-08-26 VITALS — BP 148/88 | HR 60 | Wt 140.0 lb

## 2020-08-26 DIAGNOSIS — G8929 Other chronic pain: Secondary | ICD-10-CM | POA: Diagnosis not present

## 2020-08-26 DIAGNOSIS — G5701 Lesion of sciatic nerve, right lower limb: Secondary | ICD-10-CM | POA: Diagnosis not present

## 2020-08-26 DIAGNOSIS — G9519 Other vascular myelopathies: Secondary | ICD-10-CM

## 2020-08-26 DIAGNOSIS — M25512 Pain in left shoulder: Secondary | ICD-10-CM | POA: Diagnosis not present

## 2020-08-26 NOTE — Assessment & Plan Note (Signed)
Patient feel like she is doing better overall.  Discussed we can repeat the piriformis injections if needed.  Patient recently did have an MRI from an outside facility that was independently visualized by me showing the patient does have severe spinal stenosis.  Patient wants to avoid any other type of epidurals at this moment and would not want any surgical intervention.  Do not feel that anything else at this time would be warranted.  Patient is talking to the neurologist about a nerve conduction study.  Patient will follow up with me again in 6 weeks

## 2020-08-26 NOTE — Patient Instructions (Addendum)
Good to see you again  You are doing SO well!  Let's make no changes  Check in again in 6 weeks  Tell Roger hi!

## 2020-10-07 ENCOUNTER — Ambulatory Visit (INDEPENDENT_AMBULATORY_CARE_PROVIDER_SITE_OTHER): Payer: Medicare PPO | Admitting: Family Medicine

## 2020-10-07 ENCOUNTER — Encounter: Payer: Self-pay | Admitting: Family Medicine

## 2020-10-07 ENCOUNTER — Ambulatory Visit: Payer: Self-pay

## 2020-10-07 ENCOUNTER — Other Ambulatory Visit: Payer: Self-pay

## 2020-10-07 VITALS — BP 142/72 | HR 76 | Ht 62.0 in | Wt 141.0 lb

## 2020-10-07 DIAGNOSIS — M1712 Unilateral primary osteoarthritis, left knee: Secondary | ICD-10-CM

## 2020-10-07 DIAGNOSIS — G8929 Other chronic pain: Secondary | ICD-10-CM | POA: Diagnosis not present

## 2020-10-07 DIAGNOSIS — G9519 Other vascular myelopathies: Secondary | ICD-10-CM | POA: Diagnosis not present

## 2020-10-07 DIAGNOSIS — M25512 Pain in left shoulder: Secondary | ICD-10-CM

## 2020-10-07 DIAGNOSIS — S22080G Wedge compression fracture of T11-T12 vertebra, subsequent encounter for fracture with delayed healing: Secondary | ICD-10-CM | POA: Diagnosis not present

## 2020-10-07 MED ORDER — AMBULATORY NON FORMULARY MEDICATION: 1.0000 [IU] | Freq: Once | 0 refills | Status: AC

## 2020-10-07 MED ORDER — DULOXETINE HCL 20 MG PO CPEP: 20.0000 mg | ORAL_CAPSULE | Freq: Every day | ORAL | 0 refills | Status: DC

## 2020-10-07 NOTE — Patient Instructions (Addendum)
Stop Lexapro Start Cymbalta '20mg'$  for pain Continue to monitor back, hip and knee Ice after activity and before bed See me in 4-6 weeks

## 2020-10-07 NOTE — Progress Notes (Signed)
[place] Phone: [phone] Subjective:   Sarah Castro, am serving as a scribe for Dr. Hulan Saas. This visit occurred during the SARS-CoV-2 public health emergency.  Safety protocols were in place, including screening questions prior to the visit, additional usage of staff PPE, and extensive cleaning of exam room while observing appropriate contact time as indicated for disinfecting solutions.   I'm seeing this patient by the request  of:  Wenda Low, MD  CC: Back pain follow-up  [id number]  08/26/2020 Patient feel like she is doing better overall.  Discussed we can repeat the piriformis injections if needed.  Patient recently did have an MRI from an outside facility that was independently visualized by me showing the patient does have severe spinal stenosis.  Patient wants to avoid any other type of epidurals at this moment and would not want any surgical intervention.  Do not feel that anything else at this time would be warranted.  Patient is talking to the neurologist about a nerve conduction study.  Patient will follow up with me again in 6 weeks  Update 10/07/2020 Sarah Castro is a 80 y.o. female coming in with complaint of L shoulder and R piriformis. Patient states that she continues to have pain when walking in the R hip. Pain seems to be worse when she performs lumbar flexion.   Pain in L knee has increased since last visit. Patient notes sitting down in a chair that rotated and did not have arms .  Known arthritic changes.  L shoulder has improved and is not bothering her today.       Past Medical History:  Diagnosis Date   Arthritis    Depression with anxiety    Hypercholesteremia    Hypertension    Macular degeneration    PONV (postoperative nausea and vomiting)    Stroke [facility])    Past Surgical History:  Procedure Laterality Date   APPENDECTOMY     broken back      BUNIONECTOMY  2013   CATARACT EXTRACTION     right   HAMMER TOE SURGERY  2013   HEMATOMA EVACUATION  5 years ago   IR RADIOLOGIST EVAL & MGMT  12/04/2019   KNEE ARTHROSCOPY     right   LAPAROSCOPIC APPENDECTOMY N/A 10/24/2012   Procedure: APPENDECTOMY LAPAROSCOPIC;  Surgeon: Rolm Bookbinder, MD;  Location: [facility];  Service: General;  Laterality: N/A;   LAPAROSCOPIC APPENDECTOMY     POSTERIOR TIBIAL TENDON REPAIR  1998   REPLACEMENT TOTAL KNEE Right    TONSILLECTOMY   age 32   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   Procedure: TOTAL KNEE ARTHROPLASTY;  Surgeon: Mauri Pole, MD;  Location: [facility];  Service: Orthopedics;  Laterality: Right;   Social History   Socioeconomic History   Marital status: Married    Spouse name: Not on file   Number of children: 2   Years of education: college   Highest education level: Bachelor's degree (e.g., BA, AB, BS)  Occupational History   Occupation: Retired  Tobacco Use   Smoking status: Former    Packs/day: 0.50    Years: 20.00    Pack years: 10.00    Types: Cigarettes    Quit date: 02/07/2011    Years since quitting: 9.6   Smokeless tobacco: Never  Vaping Use   Vaping Use: Never used  Substance and Sexual Activity   Alcohol use: Not Currently  Comment: 2-3 glass of wine or vodka per day   Drug use: Never   Sexual activity: Not on file  Other Topics Concern   Not on file  Social History Narrative   Lives alone.   Left-handed.   One cup caffeine per day.   Social Determinants of Health   Financial Resource Strain: Not on file  Food Insecurity: Not on file  Transportation Needs: Not on file  Physical Activity: Not on file  Stress: Not on file  Social Connections: Not on file   Allergies  Allergen Reactions   Amlodipine Swelling   Hydrocodone Nausea And Vomiting   Sulfa Antibiotics Rash   Family History  Problem Relation Age of Onset   Hypertension Mother    Hypertension Father    Cancer Father        throat    Throat cancer Father    Cancer Sister        non hodgkins lymphoma   Non-Hodgkin's lymphoma Sister    Non-Hodgkin's lymphoma Brother    Colon cancer Maternal Grandmother 78   Prostate cancer Maternal Uncle      Current Outpatient Medications (Cardiovascular):    amLODipine (NORVASC) 5 MG tablet, Take 1 tablet (5 mg total) by mouth daily.   hydrochlorothiazide (HYDRODIURIL) 25 MG tablet, Take 25 mg by mouth every morning.   lovastatin (MEVACOR) 40 MG tablet, Take 20 mg by mouth at bedtime.   metoprolol succinate (TOPROL-XL) 25 MG 24 hr tablet, Take 25 mg by mouth daily.   olmesartan (BENICAR) 40 MG tablet, Take 40 mg by mouth daily.     Current Outpatient Medications (Other):    AMBULATORY NON FORMULARY MEDICATION, Rolling walker   AMBULATORY NON FORMULARY MEDICATION, 1 Units by Other route once for 1 dose.   Calcium Carbonate-Vitamin D (CALCIUM 600 + D PO), Take 1 tablet by mouth 2 (two) times daily.   cholecalciferol (VITAMIN D) 400 UNITS TABS, Take 400-800 Units by mouth 2 (two) times daily. Pt takes 400 mg at night. 800 mg qam   DULoxetine (CYMBALTA) 20 MG capsule, Take 1 capsule (20 mg total) by mouth daily.   escitalopram (LEXAPRO) 10 MG tablet, Take 1 tablet by mouth daily.   fish oil-omega-3 fatty acids 1000 MG capsule, Take 2 g by mouth 2 (two) times daily.   Multiple Vitamins-Minerals (MULTIVITAMIN WITH MINERALS) tablet, Take 1 tablet by mouth daily.   Multiple Vitamins-Minerals (OCUVITE PRESERVISION PO), Take 2 capsules by mouth daily.   Red Yeast Rice Extract 600 MG CAPS, Take 600 mg by mouth 2 (two) times daily.   Terbinafine HCl 1 % SOLN, Spray to feet and nails once daily for foot fungus   triamcinolone cream (KENALOG) 0.5 %, Apply 1 application topically 2 (two) times daily. To affected areas.   TURMERIC PO, Take 500 mg by mouth 2 (two) times daily.   Vitamin D, Ergocalciferol, (DRISDOL) 1.25 MG (50000 UT) CAPS capsule, TAKE 1 CAPSULE BY MOUTH EVERY 7  DAYS   Reviewed prior external information including notes and imaging from  primary care provider As well as notes that were available from care everywhere and other healthcare systems.  Past medical history, social, surgical and family history all reviewed in electronic medical record.  Castro pertanent information unless stated regarding to the chief complaint.   Review of Systems:  Castro headache, visual changes, nausea, vomiting, diarrhea, constipation, dizziness, abdominal pain, skin rash, fevers, chills, night sweats, weight loss, swollen lymph nodes, joint swelling, chest pain, shortness of  breath, mood changes. POSITIVE muscle aches, body aches  Objective  Blood pressure (!) 142/72, pulse 76, height '5\' 2"'$  (1.575 m), weight 141 lb (64 kg), SpO2 97 %.   General: Castro apparent distress alert and oriented x3 mood and affect normal, dressed appropriately.  HEENT: States pain.  Asymmetry  noted. MSK:   severe OA of multiple joints  Left knee does have pain with some ROM but only lacks 10 degrees of flexion  Low back exam does have tenderness to palpation.  Patient does have significant scoliosis noted.  Significant loss of lordosis.  Severely tender to palpation in the piriformis on the right side.  Castro atrophy noted of the lower extremities.  Neurovascularly intact distally though.    Impression and Recommendations:     The above documentation has been reviewed and is accurate and complete Lyndal Pulley, DO

## 2020-10-07 NOTE — Assessment & Plan Note (Signed)
The patient was to hold on injection.  He does have some swelling noted.  Patient is started on Cymbalta which does have some benefit for osteoarthritis of the knee.  Warned of potential side effects and patient will be given a handout from pharmacy as well.  Follow-up again in 4 to 6 weeks to see if any titration to 30 mg di is necessary.

## 2020-10-07 NOTE — Assessment & Plan Note (Signed)
\   history of spinal stenosis.  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  Patient does have minimal risk for falls but continues to have chronic pain.  Patient does have severe spinal stenosis and.  Does not want to have epidurals.  Discussed different medications and started on Cymbalta.  Side effects including mild progress.  Patient is accompanied with daughter who heard the potential side effects and will call us if anything occurs.  Patient will discontinue the Lexapro which she is on at the moment.  She is on a low-dose of 10 mg.  Does not feel like it made any significant change in pain or anything else.  Patient would like to primary care otherwise.  Patient to follow-up with me again in 4 to 6 weeks

## 2020-10-26 NOTE — Progress Notes (Signed)
Sarah Castro Phone: [phone] Subjective:   Sarah Castro, am serving as a scribe for Dr. Hulan Saas. This visit occurred during the SARS-CoV-2 public health emergency.  Safety protocols were in place, including screening questions prior to the visit, additional usage of staff PPE, and extensive cleaning of exam room while observing appropriate contact time as indicated for disinfecting solutions.   I'm seeing this patient by the request  of:  Sarah Low, MD  CC: Left knee pain  [id number]  10/07/2020 The patient was to hold on injection.  He does have some swelling noted.  Patient is started on Cymbalta which does have some benefit for osteoarthritis of the knee.  Warned of potential side effects and patient will be given a handout from pharmacy as well.  Follow-up again in 4 to 6 weeks to see if any titration to 30 mg di is necessary.   history of spinal stenosis.  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  Patient does have minimal risk for falls but continues to have chronic pain.  Patient does have severe spinal stenosis and.  Does not want to have epidurals.  Discussed different medications and started on Cymbalta.  Side effects including mild progress.  Patient is accompanied with daughter who heard the potential side effects and will call us if anything occurs.  Patient will discontinue the Lexapro which she is on at the moment.  She is on a Castro-dose of 10 mg.  Does not feel like it made any significant change in pain or anything else.  Patient would like to primary care otherwise.  Patient to follow-up with me again in 4 to 6 weeks  Update 10/27/2020 Sarah Castro is a 80 y.o. female coming in with complaint of L knee pain. Last injection December 2021. Patient states that she had increase in L knee swelling one week ago. Little pain associated with L knee  swelling. Daughter states patient injured knee a couple of weeks ago getting out of chair.  Patient states that it is worsening over the course of time and having increasing instability.     Past Medical History:  Diagnosis Date   Arthritis    Depression with anxiety    Hypercholesteremia    Hypertension    Macular degeneration    PONV (postoperative nausea and vomiting)    Stroke [facility])    Past Surgical History:  Procedure Laterality Date   APPENDECTOMY     broken back     BUNIONECTOMY  2013   CATARACT EXTRACTION     right   HAMMER TOE SURGERY  2013   HEMATOMA EVACUATION  5 years ago   IR RADIOLOGIST EVAL & MGMT  12/04/2019   KNEE ARTHROSCOPY     right   LAPAROSCOPIC APPENDECTOMY N/A 10/24/2012   Procedure: APPENDECTOMY LAPAROSCOPIC;  Surgeon: Rolm Bookbinder, MD;  Location: [place];  Service: General;  Laterality: N/A;   LAPAROSCOPIC APPENDECTOMY     POSTERIOR TIBIAL TENDON REPAIR  1998   REPLACEMENT TOTAL KNEE Right    TONSILLECTOMY   age 45   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   Procedure: TOTAL KNEE ARTHROPLASTY;  Surgeon: Mauri Pole, MD;  Location: [facility];  Service: Orthopedics;  Laterality: Right;   Social History   Socioeconomic History   Marital status: Married    Spouse name: Not on file   Number of children: 2  Years of education: college   Highest education level: Bachelor's degree (e.g., BA, AB, BS)  Occupational History   Occupation: Retired  Tobacco Use   Smoking status: Former    Packs/day: 0.50    Years: 20.00    Pack years: 10.00    Types: Cigarettes    Quit date: 02/07/2011    Years since quitting: 9.7   Smokeless tobacco: Never  Vaping Use   Vaping Use: Never used  Substance and Sexual Activity   Alcohol use: Not Currently    Comment: 2-3 glass of wine or vodka per day   Drug use: Never   Sexual activity: Not on file  Other Topics Concern   Not on file  Social History Narrative   Lives alone.   Left-handed.   One cup caffeine  per day.   Social Determinants of Health   Financial Resource Strain: Not on file  Food Insecurity: Not on file  Transportation Needs: Not on file  Physical Activity: Not on file  Stress: Not on file  Social Connections: Not on file   Allergies  Allergen Reactions   Amlodipine Swelling   Hydrocodone Nausea And Vomiting   Sulfa Antibiotics Rash   Family History  Problem Relation Age of Onset   Hypertension Mother    Hypertension Father    Cancer Father        throat   Throat cancer Father    Cancer Sister        non hodgkins lymphoma   Non-Hodgkin's lymphoma Sister    Non-Hodgkin's lymphoma Brother    Colon cancer Maternal Grandmother 43   Prostate cancer Maternal Uncle      Current Outpatient Medications (Cardiovascular):    amLODipine (NORVASC) 5 MG tablet, Take 1 tablet (5 mg total) by mouth daily.   hydrochlorothiazide (HYDRODIURIL) 25 MG tablet, Take 25 mg by mouth every morning.   lovastatin (MEVACOR) 40 MG tablet, Take 20 mg by mouth at bedtime.   metoprolol succinate (TOPROL-XL) 25 MG 24 hr tablet, Take 25 mg by mouth daily.   olmesartan (BENICAR) 40 MG tablet, Take 40 mg by mouth daily.     Current Outpatient Medications (Other):    AMBULATORY NON FORMULARY MEDICATION, Rolling walker   Calcium Carbonate-Vitamin D (CALCIUM 600 + D PO), Take 1 tablet by mouth 2 (two) times daily.   cholecalciferol (VITAMIN D) 400 UNITS TABS, Take 400-800 Units by mouth 2 (two) times daily. Pt takes 400 mg at night. 800 mg qam   DULoxetine (CYMBALTA) 20 MG capsule, Take 1 capsule (20 mg total) by mouth daily.   escitalopram (LEXAPRO) 10 MG tablet, Take 1 tablet by mouth daily.   fish oil-omega-3 fatty acids 1000 MG capsule, Take 2 g by mouth 2 (two) times daily.   Multiple Vitamins-Minerals (MULTIVITAMIN WITH MINERALS) tablet, Take 1 tablet by mouth daily.   Multiple Vitamins-Minerals (OCUVITE PRESERVISION PO), Take 2 capsules by mouth daily.   Red Yeast Rice Extract 600 MG  CAPS, Take 600 mg by mouth 2 (two) times daily.   Terbinafine HCl 1 % SOLN, Spray to feet and nails once daily for foot fungus   triamcinolone cream (KENALOG) 0.5 %, Apply 1 application topically 2 (two) times daily. To affected areas.   TURMERIC PO, Take 500 mg by mouth 2 (two) times daily.   Vitamin D, Ergocalciferol, (DRISDOL) 1.25 MG (50000 UT) CAPS capsule, TAKE 1 CAPSULE BY MOUTH EVERY 7 DAYS   Reviewed prior external information including notes and imaging from  primary care provider As well as notes that were available from care everywhere and other healthcare systems.  Past medical history, social, surgical and family history all reviewed in electronic medical record.  Castro pertanent information unless stated regarding to the chief complaint.   Review of Systems:  Castro headache, visual changes, nausea, vomiting, diarrhea, constipation, dizziness, abdominal pain, skin rash, fevers, chills, night sweats, weight loss, swollen lymph nodes, body aches, joint swelling, chest pain, shortness of breath, mood changes. POSITIVE muscle aches  Objective  Blood pressure 138/72, pulse 69, height 5\' 2"  (1.575 m), weight 140 lb (63.5 kg), SpO2 95 %.   General: Castro apparent distress alert and oriented x3 mood and affect normal, dressed appropriately.  HEENT: Pupils equal, extraocular movements intact  Respiratory: Patient's speak in full sentences and does not appear short of breath  Cardiovascular: 3+ pitting edema on the left compared to 1+ pitting edema on the right lower extremity.  Patient is severely tender to palpation over the calf noted.  Castro erythema of the skin Castro noted. Gait severely antalgic walking with the aid of her daughter as well as a cane MSK: Left knee does have swelling noted.  Decrease in extension and flexion.  Instability with valgus and varus force.  Procedure: Real-time Ultrasound Guided Injection of left knee Device: GE Logiq Q7 Ultrasound guided injection is preferred based  studies that show increased duration, increased effect, greater accuracy, decreased procedural pain, increased response rate, and decreased cost with ultrasound guided versus blind injection.  Verbal informed consent obtained.  Time-out conducted.  Noted Castro overlying erythema, induration, or other signs of local infection.  Skin prepped in a sterile fashion.  Local anesthesia: Topical Ethyl chloride.  With sterile technique and under real time ultrasound guidance: With a 22-gauge 2 inch needle patient was injected with 4 cc of 0.5% Marcaine and aspirated 55 cc of straw light-colored fluid then injected 1 cc of Kenalog 40 mg/dL. This was from a superior lateral approach.  Completed without difficulty  Pain immediately resolved suggesting accurate placement of the medication.  Advised to call if fevers/chills, erythema, induration, drainage, or persistent bleeding.  Impression: Technically successful ultrasound guided injection.   Impression and Recommendations:     The above documentation has been reviewed and is accurate and complete Lyndal Pulley, DO

## 2020-10-27 ENCOUNTER — Ambulatory Visit: Payer: Self-pay

## 2020-10-27 ENCOUNTER — Other Ambulatory Visit: Payer: Self-pay

## 2020-10-27 ENCOUNTER — Encounter: Payer: Self-pay | Admitting: Family Medicine

## 2020-10-27 ENCOUNTER — Ambulatory Visit (INDEPENDENT_AMBULATORY_CARE_PROVIDER_SITE_OTHER): Payer: Medicare PPO | Admitting: Family Medicine

## 2020-10-27 ENCOUNTER — Ambulatory Visit (HOSPITAL_COMMUNITY)
Admission: RE | Admit: 2020-10-27 | Discharge: 2020-10-27 | Disposition: A | Discharge status: Home / Self Care | Payer: Medicare PPO | Source: Ambulatory Visit | Attending: Cardiology | Admitting: Cardiology

## 2020-10-27 VITALS — BP 138/72 | HR 69 | Ht 62.0 in | Wt 140.0 lb

## 2020-10-27 DIAGNOSIS — M1712 Unilateral primary osteoarthritis, left knee: Secondary | ICD-10-CM | POA: Diagnosis not present

## 2020-10-27 DIAGNOSIS — M79662 Pain in left lower leg: Secondary | ICD-10-CM

## 2020-10-27 DIAGNOSIS — M25562 Pain in left knee: Secondary | ICD-10-CM

## 2020-10-27 DIAGNOSIS — R600 Localized edema: Secondary | ICD-10-CM

## 2020-10-27 NOTE — Patient Instructions (Addendum)
  [facility]  ([facility] [employer] in [facility]) [address] [place], [place] [address] [phone] 3:00pm today arrive at 2:45pm See me again as scheduled

## 2020-10-27 NOTE — Assessment & Plan Note (Addendum)
Large joint effusion noted today.  Patient did have 55 cc of straw light-colored fluid aspirated today.  Given a steroid injection and hopefully patient will do well.  Last injection was nearly 9 months ago.  Continue on the Cymbalta at this point and may need to increase to 30 mg at follow-up.  Follow-up with me again in 4 to 5 weeks. Due to the swelling in the lower extremity being different than the contralateral side as well as tenderness in the calf and patient being a fairly high risk we will get a Doppler to rule out deep venous thrombosis.

## 2020-10-29 ENCOUNTER — Other Ambulatory Visit: Payer: Self-pay | Admitting: Family Medicine

## 2020-11-04 ENCOUNTER — Ambulatory Visit: Payer: Medicare PPO | Admitting: Family Medicine

## 2020-11-11 ENCOUNTER — Ambulatory Visit: Payer: Medicare PPO | Admitting: Family Medicine

## 2020-11-22 DIAGNOSIS — M21332 Wrist drop, left wrist: Secondary | ICD-10-CM | POA: Diagnosis not present

## 2020-11-23 NOTE — Progress Notes (Signed)
[place] Phone: [phone] Subjective:   Sarah Castro, am serving as a scribe for Dr. Hulan Saas. This visit occurred during the SARS-CoV-2 public health emergency.  Safety protocols were in place, including screening questions prior to the visit, additional usage of staff PPE, and extensive cleaning of exam room while observing appropriate contact time as indicated for disinfecting solutions.   I'm seeing this patient by the request  of:  Wenda Low, MD  CC:   [id number]  10/27/2020 Large joint effusion noted today.  Patient did have 55 cc of straw light-colored fluid aspirated today.  Given a steroid injection and hopefully patient will do well.  Last injection was nearly 9 months ago.  Continue on the Cymbalta at this point and may need to increase to 30 mg at follow-up.  Follow-up with me again in 4 to 5 weeks. Due to the swelling in the lower extremity being different than the contralateral side as well as tenderness in the calf and patient being a fairly high risk we will get a Doppler to rule out deep venous thrombosis.  Update 11/24/2020 Sarah Castro is a 80 y.o. female coming in with complaint of L knee pain. Patient states that her L knee pain has subsided.   Since Saturday patient has not been able to extend wrist after falling asleep for 15 minutes. Unable to write or eat well with this hand.   Patient is having worsening left shoulder pain.  Has Left shoulder pain with radiculopathy intermittently.  Has had more of arthritis with a rotator cuff tear.     Past Medical History:  Diagnosis Date   Arthritis    Depression with anxiety    Hypercholesteremia    Hypertension    Macular degeneration    PONV (postoperative nausea and vomiting)    Stroke [facility])    Past Surgical History:  Procedure Laterality Date   APPENDECTOMY     broken back     BUNIONECTOMY  2013   CATARACT  EXTRACTION     right   HAMMER TOE SURGERY  2013   HEMATOMA EVACUATION  5 years ago   IR RADIOLOGIST EVAL & MGMT  12/04/2019   KNEE ARTHROSCOPY     right   LAPAROSCOPIC APPENDECTOMY N/A 10/24/2012   Procedure: APPENDECTOMY LAPAROSCOPIC;  Surgeon: Rolm Bookbinder, MD;  Location: [place];  Service: General;  Laterality: N/A;   LAPAROSCOPIC APPENDECTOMY     POSTERIOR TIBIAL TENDON REPAIR  1998   REPLACEMENT TOTAL KNEE Right    TONSILLECTOMY   age 66   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   Procedure: TOTAL KNEE ARTHROPLASTY;  Surgeon: Mauri Pole, MD;  Location: [facility];  Service: Orthopedics;  Laterality: Right;   Social History   Socioeconomic History   Marital status: Married    Spouse name: Not on file   Number of children: 2   Years of education: college   Highest education level: Bachelor's degree (e.g., BA, AB, BS)  Occupational History   Occupation: Retired  Tobacco Use   Smoking status: Former    Packs/day: 0.50    Years: 20.00    Pack years: 10.00    Types: Cigarettes    Quit date: 02/07/2011    Years since quitting: 9.8   Smokeless tobacco: Never  Vaping Use   Vaping Use: Never used  Substance and Sexual Activity   Alcohol use: Not Currently    Comment:  2-3 glass of wine or vodka per day   Drug use: Never   Sexual activity: Not on file  Other Topics Concern   Not on file  Social History Narrative   Lives alone.   Left-handed.   One cup caffeine per day.   Social Determinants of Health   Financial Resource Strain: Not on file  Food Insecurity: Not on file  Transportation Needs: Not on file  Physical Activity: Not on file  Stress: Not on file  Social Connections: Not on file   Allergies  Allergen Reactions   Amlodipine Swelling   Hydrocodone Nausea And Vomiting   Sulfa Antibiotics Rash   Family History  Problem Relation Age of Onset   Hypertension Mother    Hypertension Father    Cancer Father        throat   Throat cancer Father    Cancer  Sister        non hodgkins lymphoma   Non-Hodgkin's lymphoma Sister    Non-Hodgkin's lymphoma Brother    Colon cancer Maternal Grandmother 73   Prostate cancer Maternal Uncle      Current Outpatient Medications (Cardiovascular):    amLODipine (NORVASC) 5 MG tablet, Take 1 tablet (5 mg total) by mouth daily.   hydrochlorothiazide (HYDRODIURIL) 25 MG tablet, Take 25 mg by mouth every morning.   lovastatin (MEVACOR) 40 MG tablet, Take 20 mg by mouth at bedtime.   metoprolol succinate (TOPROL-XL) 25 MG 24 hr tablet, Take 25 mg by mouth daily.   olmesartan (BENICAR) 40 MG tablet, Take 40 mg by mouth daily.     Current Outpatient Medications (Other):    AMBULATORY NON FORMULARY MEDICATION, Rolling walker   Calcium Carbonate-Vitamin D (CALCIUM 600 + D PO), Take 1 tablet by mouth 2 (two) times daily.   cholecalciferol (VITAMIN D) 400 UNITS TABS, Take 400-800 Units by mouth 2 (two) times daily. Pt takes 400 mg at night. 800 mg qam   DULoxetine (CYMBALTA) 20 MG capsule, TAKE 1 CAPSULE(20 MG) BY MOUTH DAILY   escitalopram (LEXAPRO) 10 MG tablet, Take 1 tablet by mouth daily.   fish oil-omega-3 fatty acids 1000 MG capsule, Take 2 g by mouth 2 (two) times daily.   Multiple Vitamins-Minerals (MULTIVITAMIN WITH MINERALS) tablet, Take 1 tablet by mouth daily.   Multiple Vitamins-Minerals (OCUVITE PRESERVISION PO), Take 2 capsules by mouth daily.   Red Yeast Rice Extract 600 MG CAPS, Take 600 mg by mouth 2 (two) times daily.   Terbinafine HCl 1 % SOLN, Spray to feet and nails once daily for foot fungus   triamcinolone cream (KENALOG) 0.5 %, Apply 1 application topically 2 (two) times daily. To affected areas.   TURMERIC PO, Take 500 mg by mouth 2 (two) times daily.   Vitamin D, Ergocalciferol, (DRISDOL) 1.25 MG (50000 UT) CAPS capsule, TAKE 1 CAPSULE BY MOUTH EVERY 7 DAYS   Reviewed prior external information including notes and imaging from  primary care provider As well as notes that were  available from care everywhere and other healthcare systems.  Past medical history, social, surgical and family history all reviewed in electronic medical record.  Castro pertanent information unless stated regarding to the chief complaint.   Review of Systems:  Castro headache, visual changes, nausea, vomiting, diarrhea, constipation, dizziness, abdominal pain, skin rash, fevers, chills, night sweats, weight loss, swollen lymph nodes, body aches, joint swelling, chest pain, shortness of breath, mood changes. POSITIVE muscle aches  Objective  Blood pressure 132/70, pulse 77, height 5'  2" (1.575 m), weight 137 lb (62.1 kg), SpO2 98 %.   General: Castro apparent distress alert and oriented x3 mood and affect normal, dressed appropriately.  HEENT: Pupils asymmetry noted Respiratory: Patient's speak in full sentences and does not appear short of breath  Cardiovascular: 1+ lower extremity edema, mild tender, Castro erythema  Gait n severely antalgic using a cane to help walk. Significant arthritic changes of multiple joints.  Left shoulder does have weakness noted of the rotator cuff.   Procedure: Real-time Ultrasound Guided Injection of left glenohumeral joint Device: GE Logiq E  Ultrasound guided injection is preferred based studies that show increased duration, increased effect, greater accuracy, decreased procedural pain, increased response rate with ultrasound guided versus blind injection.  Verbal informed consent obtained.  Time-out conducted.  Noted Castro overlying erythema, induration, or other signs of local infection.  Skin prepped in a sterile fashion.  Local anesthesia: Topical Ethyl chloride.  With sterile technique and under real time ultrasound guidance:  Joint visualized.  21g 2 inch needle inserted posterior approach. Pictures taken for needle placement. Patient did have injection of 2 cc of 0.5% Marcaine, and 1cc of Kenalog 40 mg/dL. Completed without difficulty  Pain immediately resolved  suggesting accurate placement of the medication.  Advised to call if fevers/chills, erythema, induration, drainage, or persistent bleeding.  Impression: Technically successful ultrasound guided injection.  Procedure: Real-time Ultrasound Guided Injection of left acromioclavicular joint Device: GE Logiq Q7 Ultrasound guided injection is preferred based studies that show increased duration, increased effect, greater accuracy, decreased procedural pain, increased response rate, and decreased cost with ultrasound guided versus blind injection.  Verbal informed consent obtained.  Time-out conducted.  Noted Castro overlying erythema, induration, or other signs of local infection.  Skin prepped in a sterile fashion.  Local anesthesia: Topical Ethyl chloride.  With sterile technique and under real time ultrasound guidance: With a 25-gauge half inch needle injecting 0.5 cc of 0.5% Marcaine and 0.5 cc of Kenalog 40 mg/mL Completed without difficulty  Pain immediately resolved suggesting accurate placement of the medication.  Advised to call if fevers/chills, erythema, induration, drainage, or persistent bleeding.  Impression: Technically successful ultrasound guided injection.   Impression and Recommendations:     The above documentation has been reviewed and is accurate and complete Lyndal Pulley, DO

## 2020-11-24 ENCOUNTER — Ambulatory Visit (INDEPENDENT_AMBULATORY_CARE_PROVIDER_SITE_OTHER): Payer: Medicare PPO | Admitting: Family Medicine

## 2020-11-24 ENCOUNTER — Ambulatory Visit: Payer: Self-pay

## 2020-11-24 ENCOUNTER — Encounter: Payer: Self-pay | Admitting: Family Medicine

## 2020-11-24 ENCOUNTER — Other Ambulatory Visit: Payer: Self-pay

## 2020-11-24 VITALS — BP 132/70 | HR 77 | Ht 62.0 in | Wt 137.0 lb

## 2020-11-24 DIAGNOSIS — G8929 Other chronic pain: Secondary | ICD-10-CM

## 2020-11-24 DIAGNOSIS — M25512 Pain in left shoulder: Secondary | ICD-10-CM | POA: Diagnosis not present

## 2020-11-24 DIAGNOSIS — M25562 Pain in left knee: Secondary | ICD-10-CM | POA: Diagnosis not present

## 2020-11-24 DIAGNOSIS — M19011 Primary osteoarthritis, right shoulder: Secondary | ICD-10-CM | POA: Diagnosis not present

## 2020-11-24 MED ORDER — DULOXETINE HCL 20 MG PO CPEP: 40.0000 mg | ORAL_CAPSULE | Freq: Every day | ORAL | 1 refills | Status: DC

## 2020-11-24 NOTE — Assessment & Plan Note (Signed)
Chronic problem with exacerbation.  Likely contributing.  He did have effusion noted.  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  Patient responded well to the injection with improvement in range of motion and strength.  Follow-up again in 4 to 6 weeks

## 2020-11-24 NOTE — Assessment & Plan Note (Signed)
Repeat injection given today, tolerated the procedure well.  Patient hopefully will make significant benefit.  Do feel the underlying arthritic changes is likely contributing as well.  Increase patient's Cymbalta to 40 mg daily.  Follow-up again in 4 to 6 weeks

## 2020-11-24 NOTE — Patient Instructions (Addendum)
Wrist shoulder resolve in 10 days 2 shoulder injections today See me again in 5-6 weeks

## 2020-12-01 DIAGNOSIS — M25532 Pain in left wrist: Secondary | ICD-10-CM | POA: Diagnosis not present

## 2020-12-20 DIAGNOSIS — E871 Hypo-osmolality and hyponatremia: Secondary | ICD-10-CM | POA: Diagnosis not present

## 2020-12-20 DIAGNOSIS — M8580 Other specified disorders of bone density and structure, unspecified site: Secondary | ICD-10-CM | POA: Diagnosis not present

## 2020-12-20 DIAGNOSIS — R7303 Prediabetes: Secondary | ICD-10-CM | POA: Diagnosis not present

## 2020-12-20 DIAGNOSIS — M5090 Cervical disc disorder, unspecified, unspecified cervical region: Secondary | ICD-10-CM | POA: Diagnosis not present

## 2020-12-20 DIAGNOSIS — I872 Venous insufficiency (chronic) (peripheral): Secondary | ICD-10-CM | POA: Diagnosis not present

## 2020-12-20 DIAGNOSIS — E782 Mixed hyperlipidemia: Secondary | ICD-10-CM | POA: Diagnosis not present

## 2020-12-20 DIAGNOSIS — M1990 Unspecified osteoarthritis, unspecified site: Secondary | ICD-10-CM | POA: Diagnosis not present

## 2020-12-20 DIAGNOSIS — Z0000 Encounter for general adult medical examination without abnormal findings: Secondary | ICD-10-CM | POA: Diagnosis not present

## 2020-12-20 DIAGNOSIS — F411 Generalized anxiety disorder: Secondary | ICD-10-CM | POA: Diagnosis not present

## 2020-12-20 DIAGNOSIS — Z1389 Encounter for screening for other disorder: Secondary | ICD-10-CM | POA: Diagnosis not present

## 2020-12-20 DIAGNOSIS — I10 Essential (primary) hypertension: Secondary | ICD-10-CM | POA: Diagnosis not present

## 2020-12-24 NOTE — Progress Notes (Signed)
[name] [address] [place] Phone: [phone] Subjective:   [name], am serving as a scribe for Dr. Hulan Saas. This visit occurred during the SARS-CoV-2 public health emergency.  Safety protocols were in place, including screening questions prior to the visit, additional usage of staff PPE, and extensive cleaning of exam room while observing appropriate contact time as indicated for disinfecting solutions.   I'm seeing this patient by the request  of:  Wenda Low, MD  CC: Pain in multiple joints  HMC:[id number]  11/24/2020 Repeat injection given today, tolerated the procedure well.  Patient hopefully will make significant benefit.  Do feel the underlying arthritic changes is likely contributing as well.  Increase patient's Cymbalta to 40 mg daily.  Follow-up again in 4 to 6 weeks Chronic problem with exacerbation.  Likely contributing.  He did have effusion noted.  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  Patient responded well to the injection with improvement in range of motion and strength.  Follow-up again in 4 to 6 weeks  Updated 12/27/2020 Sarah Castro is a 80 y.o. female coming in with complaint of left knee and bilateral shoulder pain acromioclavicular injection given at last exam. Knee pain is very little. Shoulders also progressively getting better. No new complaints      Past Medical History:  Diagnosis Date   Arthritis    Depression with anxiety    Hypercholesteremia    Hypertension    Macular degeneration    PONV (postoperative nausea and vomiting)    Stroke [facility])    Past Surgical History:  Procedure Laterality Date   APPENDECTOMY     broken back     BUNIONECTOMY  2013   CATARACT EXTRACTION     right   HAMMER TOE SURGERY  2013   HEMATOMA EVACUATION  5 years ago   IR RADIOLOGIST EVAL & MGMT  12/04/2019   KNEE ARTHROSCOPY     right   LAPAROSCOPIC APPENDECTOMY N/A  10/24/2012   Procedure: APPENDECTOMY LAPAROSCOPIC;  Surgeon: Rolm Bookbinder, MD;  Location: [place];  Service: General;  Laterality: N/A;   LAPAROSCOPIC APPENDECTOMY     POSTERIOR TIBIAL TENDON REPAIR  1998   REPLACEMENT TOTAL KNEE Right    TONSILLECTOMY   age 72   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   Procedure: TOTAL KNEE ARTHROPLASTY;  Surgeon: Mauri Pole, MD;  Location: [facility];  Service: Orthopedics;  Laterality: Right;   Social History   Socioeconomic History   Marital status: Married    Spouse name: Not on file   Number of children: 2   Years of education: college   Highest education level: Bachelor's degree (e.g., BA, AB, BS)  Occupational History   Occupation: Retired  Tobacco Use   Smoking status: Former    Packs/day: 0.50    Years: 20.00    Pack years: 10.00    Types: Cigarettes    Quit date: 02/07/2011    Years since quitting: 9.8   Smokeless tobacco: Never  Vaping Use   Vaping Use: Never used  Substance and Sexual Activity   Alcohol use: Not Currently    Comment: 2-3 glass of wine or vodka per day   Drug use: Never   Sexual activity: Not on file  Other Topics Concern   Not on file  Social History Narrative   Lives alone.   Left-handed.   One cup caffeine per day.   Social Determinants  of Health   Financial Resource Strain: Not on file  Food Insecurity: Not on file  Transportation Needs: Not on file  Physical Activity: Not on file  Stress: Not on file  Social Connections: Not on file   Allergies  Allergen Reactions   Amlodipine Swelling   Hydrocodone Nausea And Vomiting   Sulfa Antibiotics Rash   Family History  Problem Relation Age of Onset   Hypertension Mother    Hypertension Father    Cancer Father        throat   Throat cancer Father    Cancer Sister        non hodgkins lymphoma   Non-Hodgkin's lymphoma Sister    Non-Hodgkin's lymphoma Brother    Colon cancer Maternal Grandmother 62   Prostate cancer Maternal Uncle       Current Outpatient Medications (Cardiovascular):    amLODipine (NORVASC) 5 MG tablet, Take 1 tablet (5 mg total) by mouth daily.   hydrochlorothiazide (HYDRODIURIL) 25 MG tablet, Take 25 mg by mouth every morning.   lovastatin (MEVACOR) 40 MG tablet, Take 20 mg by mouth at bedtime.   metoprolol succinate (TOPROL-XL) 25 MG 24 hr tablet, Take 25 mg by mouth daily.   olmesartan (BENICAR) 40 MG tablet, Take 40 mg by mouth daily.     Current Outpatient Medications (Other):    AMBULATORY NON FORMULARY MEDICATION, Rolling walker   Calcium Carbonate-Vitamin D (CALCIUM 600 + D PO), Take 1 tablet by mouth 2 (two) times daily.   cholecalciferol (VITAMIN D) 400 UNITS TABS, Take 400-800 Units by mouth 2 (two) times daily. Pt takes 400 mg at night. 800 mg qam   fish oil-omega-3 fatty acids 1000 MG capsule, Take 2 g by mouth 2 (two) times daily.   Multiple Vitamins-Minerals (MULTIVITAMIN WITH MINERALS) tablet, Take 1 tablet by mouth daily.   Multiple Vitamins-Minerals (OCUVITE PRESERVISION PO), Take 2 capsules by mouth daily.   Red Yeast Rice Extract 600 MG CAPS, Take 600 mg by mouth 2 (two) times daily.   Terbinafine HCl 1 % SOLN, Spray to feet and nails once daily for foot fungus   triamcinolone cream (KENALOG) 0.5 %, Apply 1 application topically 2 (two) times daily. To affected areas.   TURMERIC PO, Take 500 mg by mouth 2 (two) times daily.   Vitamin D, Ergocalciferol, (DRISDOL) 1.25 MG (50000 UT) CAPS capsule, TAKE 1 CAPSULE BY MOUTH EVERY 7 DAYS    Review of Systems:  No headache, visual changes, nausea, vomiting, diarrhea, constipation, dizziness, abdominal pain, skin rash, fevers, chills, night sweats, weight loss, swollen lymph nodes,  chest pain, shortness of breath, mood changes. POSITIVE muscle aches, body aches, joint swelling  Objective  Blood pressure 136/76, pulse 68, height 5\' 2"  (1.575 m), weight 137 lb (62.1 kg), SpO2 98 %.   General: No apparent distress alert and oriented  x3 mood and affect normal, dressed appropriately.  Patient is sitting comfortably. Antalgic gait noted.    Impression and Recommendations:     The above documentation has been reviewed and is accurate and complete Lyndal Pulley, DO

## 2020-12-27 ENCOUNTER — Other Ambulatory Visit: Payer: Self-pay

## 2020-12-27 ENCOUNTER — Ambulatory Visit (INDEPENDENT_AMBULATORY_CARE_PROVIDER_SITE_OTHER): Payer: Medicare PPO | Admitting: Family Medicine

## 2020-12-27 ENCOUNTER — Encounter: Payer: Self-pay | Admitting: Family Medicine

## 2020-12-27 ENCOUNTER — Ambulatory Visit: Payer: Self-pay

## 2020-12-27 VITALS — BP 136/76 | HR 68 | Ht 62.0 in | Wt 137.0 lb

## 2020-12-27 DIAGNOSIS — M12811 Other specific arthropathies, not elsewhere classified, right shoulder: Secondary | ICD-10-CM | POA: Diagnosis not present

## 2020-12-27 DIAGNOSIS — R609 Edema, unspecified: Secondary | ICD-10-CM | POA: Diagnosis not present

## 2020-12-27 DIAGNOSIS — R600 Localized edema: Secondary | ICD-10-CM

## 2020-12-27 DIAGNOSIS — M1712 Unilateral primary osteoarthritis, left knee: Secondary | ICD-10-CM | POA: Diagnosis not present

## 2020-12-27 NOTE — Patient Instructions (Signed)
  See me when you need me

## 2020-12-27 NOTE — Assessment & Plan Note (Signed)
Knee is feeling much better at this time as well.  No significant changes.  Knows we are here if anything else is necessary.

## 2020-12-27 NOTE — Assessment & Plan Note (Signed)
Patient is doing very well at this time.  No significant changes.  Follow-up with me as needed

## 2020-12-27 NOTE — Assessment & Plan Note (Signed)
Patient has peripheral edema bilaterally with pitting edema.  Discussed with patient that this could be secondary to inactivity, patient recently did switch back to her Lexapro.  Discussed compression and elevation.  Patient has been followed by primary care physician who also has gotten labs recently which patient as well as daughter states are unremarkable.  Discontinued the Cymbalta secondary to constipation.

## 2021-04-07 NOTE — Progress Notes (Signed)
?Sarah Castro D.O. ?[facility] ?[place] ?Phone: [phone] ?Subjective:   ?I, Vilma Meckel, am serving as a Education administrator for Dr. Hulan Saas. ?This visit occurred during the SARS-CoV-2 public health emergency.  Safety protocols were in place, including screening questions prior to the visit, additional usage of staff PPE, and extensive cleaning of exam room while observing appropriate contact time as indicated for disinfecting solutions.  ? ?I'm seeing this patient by the request  of:  Wenda Low, MD ? ?CC: Left knee pain ? ?[id number]  ?12/27/2020 ?Patient has peripheral edema bilaterally with pitting edema.  Discussed with patient that this could be secondary to inactivity, patient recently did switch back to her Lexapro.  Discussed compression and elevation.  Patient has been followed by primary care physician who also has gotten labs recently which patient as well as daughter states are unremarkable.  Discontinued the Cymbalta secondary to constipation. ? ?Knee is feeling much better at this time as well.  No significant changes.  Knows we are here if anything else is necessary. ? ?Patient is doing very well at this time.  No significant changes.  Follow-up with me as needed ? ?Updated 04/12/2021 ?Sarah Castro is a 81 y.o. female coming in with complaint of knee pain. Same pain and has gotten worse within the last month. No other complaints.  Patient states that it continues to worsen.  Making it difficult to do daily activities.  Sometimes can even give her difficulty at night.  Feels like it is swollen. ? ? ? ?  ? ?Past Medical History:  ?Diagnosis Date  ? Arthritis   ? Depression with anxiety   ? Hypercholesteremia   ? Hypertension   ? Macular degeneration   ? PONV (postoperative nausea and vomiting)   ? Stroke [facility])   ? ?Past Surgical History:  ?Procedure Laterality Date  ? APPENDECTOMY    ? broken back    ? BUNIONECTOMY  2013  ? CATARACT EXTRACTION    ?  right  ? HAMMER TOE SURGERY  2013  ? HEMATOMA EVACUATION  5 years ago  ? IR RADIOLOGIST EVAL & MGMT  12/04/2019  ? KNEE ARTHROSCOPY    ? right  ? LAPAROSCOPIC APPENDECTOMY N/A 10/24/2012  ? Procedure: APPENDECTOMY LAPAROSCOPIC;  Surgeon: Rolm Bookbinder, MD;  Location: [place];  Service: General;  Laterality: N/A;  ? LAPAROSCOPIC APPENDECTOMY    ? POSTERIOR TIBIAL TENDON REPAIR  1998  ? REPLACEMENT TOTAL KNEE Right   ? TONSILLECTOMY   age 54  ? 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  ? Procedure: TOTAL KNEE ARTHROPLASTY;  Surgeon: Mauri Pole, MD;  Location: [facility];  Service: Orthopedics;  Laterality: Right;  ? ?Social History  ? ?Socioeconomic History  ? Marital status: Married  ?  Spouse name: Not on file  ? Number of children: 2  ? Years of education: college  ? Highest education level: Bachelor's degree (e.g., BA, AB, BS)  ?Occupational History  ? Occupation: Retired  ?Tobacco Use  ? Smoking status: Former  ?  Packs/day: 0.50  ?  Years: 20.00  ?  Pack years: 10.00  ?  Types: Cigarettes  ?  Quit date: 02/07/2011  ?  Years since quitting: 10.1  ? Smokeless tobacco: Never  ?Vaping Use  ? Vaping Use: Never used  ?Substance and Sexual Activity  ? Alcohol use: Not Currently  ?  Comment: 2-3 glass of wine or vodka per day  ? Drug use: Never  ?  Sexual activity: Not on file  ?Other Topics Concern  ? Not on file  ?Social History Narrative  ? Lives alone.  ? Left-handed.  ? One cup caffeine per day.  ? ?Social Determinants of Health  ? ?Financial Resource Strain: Not on file  ?Food Insecurity: Not on file  ?Transportation Needs: Not on file  ?Physical Activity: Not on file  ?Stress: Not on file  ?Social Connections: Not on file  ? ?Allergies  ?Allergen Reactions  ? Amlodipine Swelling  ? Hydrocodone Nausea And Vomiting  ? Sulfa Antibiotics Rash  ? ?Family History  ?Problem Relation Age of Onset  ? Hypertension Mother   ? Hypertension Father   ? Cancer Father   ?     throat  ? Throat cancer Father   ? Cancer Sister   ?      non hodgkins lymphoma  ? Non-Hodgkin's lymphoma Sister   ? Non-Hodgkin's lymphoma Brother   ? Colon cancer Maternal Grandmother 63  ? Prostate cancer Maternal Uncle   ? ? ? ?Current Outpatient Medications (Cardiovascular):  ?  amLODipine (NORVASC) 5 MG tablet, Take 1 tablet (5 mg total) by mouth daily. ?  hydrochlorothiazide (HYDRODIURIL) 25 MG tablet, Take 25 mg by mouth every morning. ?  lovastatin (MEVACOR) 40 MG tablet, Take 20 mg by mouth at bedtime. ?  metoprolol succinate (TOPROL-XL) 25 MG 24 hr tablet, Take 25 mg by mouth daily. ?  olmesartan (BENICAR) 40 MG tablet, Take 40 mg by mouth daily. ? ? ? ? ?Current Outpatient Medications (Other):  ?  AMBULATORY NON FORMULARY MEDICATION, Rolling walker ?  Calcium Carbonate-Vitamin D (CALCIUM 600 + D PO), Take 1 tablet by mouth 2 (two) times daily. ?  cholecalciferol (VITAMIN D) 400 UNITS TABS, Take 400-800 Units by mouth 2 (two) times daily. Pt takes 400 mg at night. 800 mg qam ?  fish oil-omega-3 fatty acids 1000 MG capsule, Take 2 g by mouth 2 (two) times daily. ?  Multiple Vitamins-Minerals (MULTIVITAMIN WITH MINERALS) tablet, Take 1 tablet by mouth daily. ?  Multiple Vitamins-Minerals (OCUVITE PRESERVISION PO), Take 2 capsules by mouth daily. ?  Red Yeast Rice Extract 600 MG CAPS, Take 600 mg by mouth 2 (two) times daily. ?  Terbinafine HCl 1 % SOLN, Spray to feet and nails once daily for foot fungus ?  triamcinolone cream (KENALOG) 0.5 %, Apply 1 application topically 2 (two) times daily. To affected areas. ?  TURMERIC PO, Take 500 mg by mouth 2 (two) times daily. ?  Vitamin D, Ergocalciferol, (DRISDOL) 1.25 MG (50000 UT) CAPS capsule, TAKE 1 CAPSULE BY MOUTH EVERY 7 DAYS ? ? ? ?Review of Systems: ? No headache, visual changes, nausea, vomiting, diarrhea, constipation, dizziness, abdominal pain, skin rash, fevers, chills, night sweats, weight loss, swollen lymph nodes, chest pain, shortness of breath, mood changes. POSITIVE muscle aches, body aches, joint  swelling ? ?Objective  ?Blood pressure 130/78, pulse 72, height 5\' 2"  (1.575 m), weight 138 lb (62.6 kg), SpO2 95 %. ?  ?General: No apparent distress alert and oriented x3 mood and affect normal, dressed appropriately.  ?HEENT: Pupils patient does have cataracts noted of the right eye ?Respiratory: Patient's speak in full sentences and does not appear short of breath  ?Cardiovascular: 2+ pitting edema ?Gait antalgic walking with the aid of a cane ?Arthritic changes of multiple joints ?Left knee does have effusion noted, limited range of motion in all planes, patient does have instability noted with valgus and varus force. ? ?After  informed written and verbal consent, patient was seated on exam table. Left knee was prepped with alcohol swab and utilizing anterolateral approach, patient's left knee space was injected with 4:1  marcaine 0.5%: Kenalog 40mg /dL. Patient tolerated the procedure well without immediate complications. ? ?  ?Impression and Recommendations:  ?  ?The above documentation has been reviewed and is accurate and complete Lyndal Pulley, DO ? ? ? ?

## 2021-04-12 ENCOUNTER — Encounter: Payer: Self-pay | Admitting: Family Medicine

## 2021-04-12 ENCOUNTER — Ambulatory Visit (INDEPENDENT_AMBULATORY_CARE_PROVIDER_SITE_OTHER): Payer: Medicare (Managed Care) | Admitting: Family Medicine

## 2021-04-12 ENCOUNTER — Other Ambulatory Visit: Payer: Self-pay

## 2021-04-12 DIAGNOSIS — M1712 Unilateral primary osteoarthritis, left knee: Secondary | ICD-10-CM

## 2021-04-12 NOTE — Patient Instructions (Addendum)
Knee injection today ?Good to see you! ?Standing appointment every 7-8 weeks ?

## 2021-04-12 NOTE — Assessment & Plan Note (Signed)
Chronic problem with exacerbation.  Discussed with her things to do and which ones to avoid, increase activity slowly.  Follow-up with me again in 6 to 8 weeks could be candidate for viscosupplementation but hopefully patient will continue to improve. ?

## 2021-04-26 NOTE — Progress Notes (Signed)
?Sarah Castro D.O. ?[facility] ?[place] ?Phone: [phone] ?Subjective:   ?I, Sarah Castro, am serving as a scribe for Dr. Hulan Saas. ? ?Patient was measured and fitted for an off-the-shelf brace. Adjustments were made to brace to ensure proper fit.  ? ? ?I'm seeing this patient by the request  of:  Wenda Low, MD ? ?CC: Left knee pain ? ?[id number]  ?04/12/2021 ?Chronic problem with exacerbation.  Discussed with her things to do and which ones to avoid, increase activity slowly.  Follow-up with me again in 6 to 8 weeks could be candidate for viscosupplementation but hopefully patient will continue to improve. ? ?Update 04/27/2021 ?Sarah Castro is a 81 y.o. female coming in with complaint of L knee pain. Patient states that her pain is the same last visit. She does not feel that steroid injection gave her any relief.  ? ?  ? ?Past Medical History:  ?Diagnosis Date  ? Arthritis   ? Depression with anxiety   ? Hypercholesteremia   ? Hypertension   ? Macular degeneration   ? PONV (postoperative nausea and vomiting)   ? Stroke [facility])   ? ?Past Surgical History:  ?Procedure Laterality Date  ? APPENDECTOMY    ? broken back    ? BUNIONECTOMY  2013  ? CATARACT EXTRACTION    ? right  ? HAMMER TOE SURGERY  2013  ? HEMATOMA EVACUATION  5 years ago  ? IR RADIOLOGIST EVAL & MGMT  12/04/2019  ? KNEE ARTHROSCOPY    ? right  ? LAPAROSCOPIC APPENDECTOMY N/A 10/24/2012  ? Procedure: APPENDECTOMY LAPAROSCOPIC;  Surgeon: Rolm Bookbinder, MD;  Location: [place];  Service: General;  Laterality: N/A;  ? LAPAROSCOPIC APPENDECTOMY    ? POSTERIOR TIBIAL TENDON REPAIR  1998  ? REPLACEMENT TOTAL KNEE Right   ? TONSILLECTOMY   age 83  ? 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  ? Procedure: TOTAL KNEE ARTHROPLASTY;  Surgeon: Mauri Pole, MD;  Location: [facility];  Service: Orthopedics;  Laterality: Right;  ? ?Social History  ? ?Socioeconomic History  ? Marital status: Married  ?  Spouse  name: Not on file  ? Number of children: 2  ? Years of education: college  ? Highest education level: Bachelor's degree (e.g., BA, AB, BS)  ?Occupational History  ? Occupation: Retired  ?Tobacco Use  ? Smoking status: Former  ?  Packs/day: 0.50  ?  Years: 20.00  ?  Pack years: 10.00  ?  Types: Cigarettes  ?  Quit date: 02/07/2011  ?  Years since quitting: 10.2  ? Smokeless tobacco: Never  ?Vaping Use  ? Vaping Use: Never used  ?Substance and Sexual Activity  ? Alcohol use: Not Currently  ?  Comment: 2-3 glass of wine or vodka per day  ? Drug use: Never  ? Sexual activity: Not on file  ?Other Topics Concern  ? Not on file  ?Social History Narrative  ? Lives alone.  ? Left-handed.  ? One cup caffeine per day.  ? ?Social Determinants of Health  ? ?Financial Resource Strain: Not on file  ?Food Insecurity: Not on file  ?Transportation Needs: Not on file  ?Physical Activity: Not on file  ?Stress: Not on file  ?Social Connections: Not on file  ? ?Allergies  ?Allergen Reactions  ? Amlodipine Swelling  ? Hydrocodone Nausea And Vomiting  ? Sulfa Antibiotics Rash  ? ?Family History  ?Problem Relation Age of Onset  ? Hypertension Mother   ?  Hypertension Father   ? Cancer Father   ?     throat  ? Throat cancer Father   ? Cancer Sister   ?     non hodgkins lymphoma  ? Non-Hodgkin's lymphoma Sister   ? Non-Hodgkin's lymphoma Brother   ? Colon cancer Maternal Grandmother 48  ? Prostate cancer Maternal Uncle   ? ? ? ?Current Outpatient Medications (Cardiovascular):  ?  amLODipine (NORVASC) 5 MG tablet, Take 1 tablet (5 mg total) by mouth daily. ?  hydrochlorothiazide (HYDRODIURIL) 25 MG tablet, Take 25 mg by mouth every morning. ?  lovastatin (MEVACOR) 40 MG tablet, Take 20 mg by mouth at bedtime. ?  metoprolol succinate (TOPROL-XL) 25 MG 24 hr tablet, Take 25 mg by mouth daily. ?  olmesartan (BENICAR) 40 MG tablet, Take 40 mg by mouth daily. ? ? ? ? ?Current Outpatient Medications (Other):  ?  AMBULATORY NON FORMULARY MEDICATION,  Rolling walker ?  Calcium Carbonate-Vitamin D (CALCIUM 600 + D PO), Take 1 tablet by mouth 2 (two) times daily. ?  cholecalciferol (VITAMIN D) 400 UNITS TABS, Take 400-800 Units by mouth 2 (two) times daily. Pt takes 400 mg at night. 800 mg qam ?  fish oil-omega-3 fatty acids 1000 MG capsule, Take 2 g by mouth 2 (two) times daily. ?  Multiple Vitamins-Minerals (MULTIVITAMIN WITH MINERALS) tablet, Take 1 tablet by mouth daily. ?  Multiple Vitamins-Minerals (OCUVITE PRESERVISION PO), Take 2 capsules by mouth daily. ?  Red Yeast Rice Extract 600 MG CAPS, Take 600 mg by mouth 2 (two) times daily. ?  Terbinafine HCl 1 % SOLN, Spray to feet and nails once daily for foot fungus ?  triamcinolone cream (KENALOG) 0.5 %, Apply 1 application topically 2 (two) times daily. To affected areas. ?  TURMERIC PO, Take 500 mg by mouth 2 (two) times daily. ?  Vitamin D, Ergocalciferol, (DRISDOL) 1.25 MG (50000 UT) CAPS capsule, TAKE 1 CAPSULE BY MOUTH EVERY 7 DAYS ? ? ?Reviewed prior external information including notes and imaging from  ?primary care provider ?As well as notes that were available from care everywhere and other healthcare systems. ? ?Past medical history, social, surgical and family history all reviewed in electronic medical record.  No pertanent information unless stated regarding to the chief complaint.  ? ?Review of Systems: ? No headache, visual changes, nausea, vomiting, diarrhea, constipation, dizziness, abdominal pain, skin rash, fevers, chills, night sweats, weight loss, swollen lymph nodes,  chest pain, shortness of breath, mood changes. POSITIVE muscle aches, joint swelling ? ?Objective  ?Blood pressure (!) 142/64, pulse 70, height '5\' 2"'$  (1.575 m), weight 136 lb (61.7 kg), SpO2 93 %. ?  ?General: No apparent distress alert and oriented x3 mood and affect normal, dressed appropriately.  ?HEENT: Pupils equal, extraocular movements intact  ?Gait severely antalgic using the aid of a walker ?MSK: Significant  inflammation and effusion noted of the left knee today.  Patient has limited range of motion secondary to pain. ? ?Procedure: Real-time Ultrasound Guided Injection of left knee ?Device: GE Logiq Q7 ?Ultrasound guided injection is preferred based studies that show increased duration, increased effect, greater accuracy, decreased procedural pain, increased response rate, and decreased cost with ultrasound guided versus blind injection.  ?Verbal informed consent obtained.  ?Time-out conducted.  ?Noted no overlying erythema, induration, or other signs of local infection.  ?Skin prepped in a sterile fashion.  ?Local anesthesia: Topical Ethyl chloride.  ?With sterile technique and under real time ultrasound guidance: With a 22-gauge 2  inch needle patient was injected with 4 cc of 0.5% Marcaine and aspirated greater than 50 cc of straw-colored fluid and then injected 1 cc of Kenalog 40 mg/dL. This was from a superior lateral approach.  ?Completed without difficulty  ?Pain immediately improved suggesting accurate placement of the medication.  ?Advised to call if fevers/chills, erythema, induration, drainage, or persistent bleeding.  ?Impression: Technically successful ultrasound guided injection. ? ?  ?Impression and Recommendations:  ?  ? ?The above documentation has been reviewed and is accurate and complete Lyndal Pulley, DO ? ? ? ?

## 2021-04-27 ENCOUNTER — Ambulatory Visit: Payer: Self-pay

## 2021-04-27 ENCOUNTER — Encounter: Payer: Self-pay | Admitting: Family Medicine

## 2021-04-27 ENCOUNTER — Ambulatory Visit (INDEPENDENT_AMBULATORY_CARE_PROVIDER_SITE_OTHER): Payer: Medicare (Managed Care) | Admitting: Family Medicine

## 2021-04-27 ENCOUNTER — Other Ambulatory Visit: Payer: Self-pay

## 2021-04-27 VITALS — BP 142/64 | HR 70 | Ht 62.0 in | Wt 136.0 lb

## 2021-04-27 DIAGNOSIS — M1712 Unilateral primary osteoarthritis, left knee: Secondary | ICD-10-CM

## 2021-04-27 DIAGNOSIS — M2550 Pain in unspecified joint: Secondary | ICD-10-CM | POA: Diagnosis not present

## 2021-04-27 NOTE — Patient Instructions (Addendum)
Drained knee today ?Keep other appointment ?Have fun in [place] ?

## 2021-04-27 NOTE — Assessment & Plan Note (Signed)
Repeat aspiration done today.  Chronic problem with exacerbation.  Patient given another injection as well.  Patient hopefully will respond.  Do think patient should consider the possibility of viscosupplementation and still awaiting approval from insurance patient is considering the possibility of surgical intervention and if she would like to like to refer her accordingly.  From comorbidities that does make patient will benefit from a brace.  I do think the patient will hopefully make some improvement after this wound and will be traveling in [place] can follow-up with [place] or surgery ?

## 2021-04-28 ENCOUNTER — Other Ambulatory Visit: Payer: Self-pay | Admitting: Family Medicine

## 2021-04-28 LAB — SYNOVIAL FLUID ANALYSIS, COMPLETE
Basophils, %: 0 %
Eosinophils-Synovial: 0 % (ref 0–2)
Lymphocytes-Synovial Fld: 60 % (ref 0–74)
Monocyte/Macrophage: 15 % (ref 0–69)
Neutrophil, Synovial: 25 % — ABNORMAL HIGH (ref 0–24)
Synoviocytes, %: 0 % (ref 0–15)
WBC, Synovial: 1083 cells/uL — ABNORMAL HIGH (ref <150)

## 2021-04-28 MED ORDER — DOXYCYCLINE HYCLATE 100 MG PO TABS: 100.0000 mg | ORAL_TABLET | Freq: Two times a day (BID) | ORAL | 0 refills | Status: DC

## 2021-06-01 NOTE — Progress Notes (Signed)
?Charlann Boxer D.O. ?[facility] ?[place] ?Phone: [phone] ?Subjective:   ?I, Sarah Castro, am serving as a Education administrator for Dr. Hulan Saas. ?This visit occurred during the SARS-CoV-2 public health emergency.  Safety protocols were in place, including screening questions prior to the visit, additional usage of staff PPE, and extensive cleaning of exam room while observing appropriate contact time as indicated for disinfecting solutions.  ? ?I'm seeing this patient by the request  of:  Wenda Low, MD ? ?CC: Left knee pain ? ?[id number]  ?04/27/2021 ?Repeat aspiration done today.  Chronic problem with exacerbation.  Patient given another injection as well.  Patient hopefully will respond.  Do think patient should consider the possibility of viscosupplementation and still awaiting approval from insurance patient is considering the possibility of surgical intervention and if she would like to like to refer her accordingly.  From comorbidities that does make patient will benefit from a brace.  I do think the patient will hopefully make some improvement after this wound and will be traveling in [place] can follow-up with [place] or surgery ? ?Updated 06/02/2021 ?Sarah Castro is a 81 y.o. female coming in with complaint of left knee pain. Keeping the brace on most of the time. Taking the fluid off and the injection feels like it didn't do much, but is maintaining. No new complaints. ? ? ? ?  ? ?Past Medical History:  ?Diagnosis Date  ? Arthritis   ? Depression with anxiety   ? Hypercholesteremia   ? Hypertension   ? Macular degeneration   ? PONV (postoperative nausea and vomiting)   ? Stroke [facility])   ? ?Past Surgical History:  ?Procedure Laterality Date  ? APPENDECTOMY    ? broken back    ? BUNIONECTOMY  2013  ? CATARACT EXTRACTION    ? right  ? HAMMER TOE SURGERY  2013  ? HEMATOMA EVACUATION  5 years ago  ? IR RADIOLOGIST EVAL & MGMT  12/04/2019  ? KNEE ARTHROSCOPY    ? right   ? LAPAROSCOPIC APPENDECTOMY N/A 10/24/2012  ? Procedure: APPENDECTOMY LAPAROSCOPIC;  Surgeon: Rolm Bookbinder, MD;  Location: [place];  Service: General;  Laterality: N/A;  ? LAPAROSCOPIC APPENDECTOMY    ? POSTERIOR TIBIAL TENDON REPAIR  1998  ? REPLACEMENT TOTAL KNEE Right   ? TONSILLECTOMY   age 34  ? 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  ? Procedure: TOTAL KNEE ARTHROPLASTY;  Surgeon: Mauri Pole, MD;  Location: [facility];  Service: Orthopedics;  Laterality: Right;  ? ?Social History  ? ?Socioeconomic History  ? Marital status: Married  ?  Spouse name: Not on file  ? Number of children: 2  ? Years of education: college  ? Highest education level: Bachelor's degree (e.g., BA, AB, BS)  ?Occupational History  ? Occupation: Retired  ?Tobacco Use  ? Smoking status: Former  ?  Packs/day: 0.50  ?  Years: 20.00  ?  Pack years: 10.00  ?  Types: Cigarettes  ?  Quit date: 02/07/2011  ?  Years since quitting: 10.3  ? Smokeless tobacco: Never  ?Vaping Use  ? Vaping Use: Never used  ?Substance and Sexual Activity  ? Alcohol use: Not Currently  ?  Comment: 2-3 glass of wine or vodka per day  ? Drug use: Never  ? Sexual activity: Not on file  ?Other Topics Concern  ? Not on file  ?Social History Narrative  ? Lives alone.  ? Left-handed.  ?  One cup caffeine per day.  ? ?Social Determinants of Health  ? ?Financial Resource Strain: Not on file  ?Food Insecurity: Not on file  ?Transportation Needs: Not on file  ?Physical Activity: Not on file  ?Stress: Not on file  ?Social Connections: Not on file  ? ?Allergies  ?Allergen Reactions  ? Amlodipine Swelling  ? Hydrocodone Nausea And Vomiting  ? Sulfa Antibiotics Rash  ? ?Family History  ?Problem Relation Age of Onset  ? Hypertension Mother   ? Hypertension Father   ? Cancer Father   ?     throat  ? Throat cancer Father   ? Cancer Sister   ?     non hodgkins lymphoma  ? Non-Hodgkin's lymphoma Sister   ? Non-Hodgkin's lymphoma Brother   ? Colon cancer Maternal Grandmother 80  ? Prostate  cancer Maternal Uncle   ? ? ? ?Current Outpatient Medications (Cardiovascular):  ?  amLODipine (NORVASC) 5 MG tablet, Take 1 tablet (5 mg total) by mouth daily. ?  hydrochlorothiazide (HYDRODIURIL) 25 MG tablet, Take 25 mg by mouth every morning. ?  lovastatin (MEVACOR) 40 MG tablet, Take 20 mg by mouth at bedtime. ?  metoprolol succinate (TOPROL-XL) 25 MG 24 hr tablet, Take 25 mg by mouth daily. ?  olmesartan (BENICAR) 40 MG tablet, Take 40 mg by mouth daily. ? ? ? ? ?Current Outpatient Medications (Other):  ?  AMBULATORY NON FORMULARY MEDICATION, Rolling walker ?  Calcium Carbonate-Vitamin D (CALCIUM 600 + D PO), Take 1 tablet by mouth 2 (two) times daily. ?  cholecalciferol (VITAMIN D) 400 UNITS TABS, Take 400-800 Units by mouth 2 (two) times daily. Pt takes 400 mg at night. 800 mg qam ?  doxycycline (VIBRA-TABS) 100 MG tablet, Take 1 tablet (100 mg total) by mouth 2 (two) times daily. ?  fish oil-omega-3 fatty acids 1000 MG capsule, Take 2 g by mouth 2 (two) times daily. ?  Multiple Vitamins-Minerals (MULTIVITAMIN WITH MINERALS) tablet, Take 1 tablet by mouth daily. ?  Multiple Vitamins-Minerals (OCUVITE PRESERVISION PO), Take 2 capsules by mouth daily. ?  Red Yeast Rice Extract 600 MG CAPS, Take 600 mg by mouth 2 (two) times daily. ?  Terbinafine HCl 1 % SOLN, Spray to feet and nails once daily for foot fungus ?  triamcinolone cream (KENALOG) 0.5 %, Apply 1 application topically 2 (two) times daily. To affected areas. ?  TURMERIC PO, Take 500 mg by mouth 2 (two) times daily. ?  Vitamin D, Ergocalciferol, (DRISDOL) 1.25 MG (50000 UT) CAPS capsule, TAKE 1 CAPSULE BY MOUTH EVERY 7 DAYS ? ? ?Reviewed prior external information including notes and imaging from  ?primary care provider ?As well as notes that were available from care everywhere and other healthcare systems. ? ?Past medical history, social, surgical and family history all reviewed in electronic medical record.  No pertanent information unless stated  regarding to the chief complaint.  ? ?Review of Systems: ? No headache, visual changes, nausea, vomiting, diarrhea, constipation, dizziness, abdominal pain, skin rash, fevers, chills, night sweats, weight loss, swollen lymph nodes, body aches, , chest pain, shortness of breath, mood changes. POSITIVE muscle aches, joint swelling ? ?Objective  ?Blood pressure 126/74, pulse 60, height '5\' 2"'$  (1.575 m), weight 135 lb (61.2 kg), SpO2 96 %. ?  ?General: No apparent distress alert and oriented x3 mood and affect normal, dressed appropriately.  ?HEENT: Pupils equal with patient not having side in the right ?Respiratory: Patient's speak in full sentences and does not appear  short of breath  ?Cardiovascular: No lower extremity edema, non tender, no erythema  ?Gait antalgic walking with the aid of a cane ?Severe OA of the knee, instability noted, only trace effusion noted of the knee at the moment though.  Is improved from previous exam ? ?After informed written and verbal consent, patient was seated on exam table. Left knee was prepped with alcohol swab and utilizing anterolateral approach, patient's left knee space was injected with 40 mg per 3 mL of Monovisc (sodium hyaluronate) in a prefilled syringe was injected easily into the knee through a 22-gauge needle..Patient tolerated the procedure well without immediate complications. ?  ?Impression and Recommendations:  ?  ?The above documentation has been reviewed and is accurate and complete Lyndal Pulley, DO ? ? ? ?

## 2021-06-02 ENCOUNTER — Ambulatory Visit (INDEPENDENT_AMBULATORY_CARE_PROVIDER_SITE_OTHER): Payer: Medicare (Managed Care) | Admitting: Family Medicine

## 2021-06-02 DIAGNOSIS — M1712 Unilateral primary osteoarthritis, left knee: Secondary | ICD-10-CM | POA: Diagnosis not present

## 2021-06-02 MED ORDER — DOXYCYCLINE HYCLATE 100 MG PO TABS: 100.0000 mg | ORAL_TABLET | Freq: Two times a day (BID) | ORAL | 0 refills | Status: DC

## 2021-06-02 NOTE — Assessment & Plan Note (Signed)
Patient does have arthritic changes.  Viscosupplementation given today, tolerated the procedure well.  Hopefully will make some improvement.  Increase icing regimen and home exercises otherwise.  Patient would otherwise need a replacement. ?

## 2021-06-02 NOTE — Patient Instructions (Addendum)
Gel injection today ?Always good to see you ?Doxycycline prescribed ?See you again in 6-8 weeks ?

## 2021-06-22 DIAGNOSIS — I700 Atherosclerosis of aorta: Secondary | ICD-10-CM | POA: Diagnosis not present

## 2021-06-22 DIAGNOSIS — E782 Mixed hyperlipidemia: Secondary | ICD-10-CM | POA: Diagnosis not present

## 2021-06-22 DIAGNOSIS — I872 Venous insufficiency (chronic) (peripheral): Secondary | ICD-10-CM | POA: Diagnosis not present

## 2021-06-22 DIAGNOSIS — M5136 Other intervertebral disc degeneration, lumbar region: Secondary | ICD-10-CM | POA: Diagnosis not present

## 2021-06-22 DIAGNOSIS — I679 Cerebrovascular disease, unspecified: Secondary | ICD-10-CM | POA: Diagnosis not present

## 2021-06-22 DIAGNOSIS — R7303 Prediabetes: Secondary | ICD-10-CM | POA: Diagnosis not present

## 2021-06-22 DIAGNOSIS — I10 Essential (primary) hypertension: Secondary | ICD-10-CM | POA: Diagnosis not present

## 2021-06-22 DIAGNOSIS — R269 Unspecified abnormalities of gait and mobility: Secondary | ICD-10-CM | POA: Diagnosis not present

## 2021-06-22 DIAGNOSIS — I639 Cerebral infarction, unspecified: Secondary | ICD-10-CM | POA: Diagnosis not present

## 2021-07-19 NOTE — Progress Notes (Unsigned)
[place] Phone: [phone] Subjective:   [name], am serving as a scribe for Dr. Hulan Saas.   I'm seeing this patient by the request  of:  Wenda Low, MD  CC: Knee and back follow-up  [id number]  06/02/2021 Patient does have arthritic changes.  Viscosupplementation given today, tolerated the procedure well.  Hopefully will make some improvement.  Increase icing regimen and home exercises otherwise.  Patient would otherwise need a replacement.  Updated 07/20/2021 Sarah Castro is a 81 y.o. female coming in with complaint of knee and back pain regarding the knees patient was given viscosupplementation 6 weeks ago. Pain is worse than last visit over lateral aspect of L knee. States gel worked for only 2 days.-0       Past Medical History:  Diagnosis Date   Arthritis    Depression with anxiety    Hypercholesteremia    Hypertension    Macular degeneration    PONV (postoperative nausea and vomiting)    Stroke [facility])    Past Surgical History:  Procedure Laterality Date   APPENDECTOMY     broken back     BUNIONECTOMY  2013   CATARACT EXTRACTION     right   HAMMER TOE SURGERY  2013   HEMATOMA EVACUATION  5 years ago   IR RADIOLOGIST EVAL & MGMT  12/04/2019   KNEE ARTHROSCOPY     right   LAPAROSCOPIC APPENDECTOMY N/A 10/24/2012   Procedure: APPENDECTOMY LAPAROSCOPIC;  Surgeon: Rolm Bookbinder, MD;  Location: [place];  Service: General;  Laterality: N/A;   [place]   REPLACEMENT TOTAL KNEE Right    TONSILLECTOMY   age 18   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   Procedure: TOTAL KNEE ARTHROPLASTY;  Surgeon: Mauri Pole, MD;  Location: [facility];  Service: Orthopedics;  Laterality: Right;   Social History   Socioeconomic History   Marital status: Married    Spouse name: Not on file   Number of children: 2   Years of  education: college   Highest education level: Bachelor's degree (e.g., BA, AB, BS)  Occupational History   Occupation: Retired  Tobacco Use   Smoking status: Former    Packs/day: 0.50    Years: 20.00    Total pack years: 10.00    Types: Cigarettes    Quit date: 02/07/2011    Years since quitting: 10.4   Smokeless tobacco: Never  Vaping Use   Vaping Use: Never used  Substance and Sexual Activity   Alcohol use: Not Currently    Comment: 2-3 glass of wine or vodka per day   Drug use: Never   Sexual activity: Not on file  Other Topics Concern   Not on file  Social History Narrative   Lives alone.   Left-handed.   One cup caffeine per day.   Social Determinants of Health   Financial Resource Strain: Not on file  Food Insecurity: Not on file  Transportation Needs: Not on file  Physical Activity: Not on file  Stress: Not on file  Social Connections: Not on file   Allergies  Allergen Reactions   Amlodipine Swelling   Hydrocodone Nausea And Vomiting   Sulfa Antibiotics Rash   Family History  Problem Relation Age of Onset   Hypertension Mother    Hypertension Father    Cancer Father  throat   Throat cancer Father    Cancer Sister        non hodgkins lymphoma   Non-Hodgkin's lymphoma Sister    Non-Hodgkin's lymphoma Brother    Colon cancer Maternal Grandmother 65   Prostate cancer Maternal Uncle      Current Outpatient Medications (Cardiovascular):    amLODipine (NORVASC) 5 MG tablet, Take 1 tablet (5 mg total) by mouth daily.   hydrochlorothiazide (HYDRODIURIL) 25 MG tablet, Take 25 mg by mouth every morning.   lovastatin (MEVACOR) 40 MG tablet, Take 20 mg by mouth at bedtime.   metoprolol succinate (TOPROL-XL) 25 MG 24 hr tablet, Take 25 mg by mouth daily.   olmesartan (BENICAR) 40 MG tablet, Take 40 mg by mouth daily.     Current Outpatient Medications (Other):    AMBULATORY NON FORMULARY MEDICATION, Rolling walker   Calcium Carbonate-Vitamin D  (CALCIUM 600 + D PO), Take 1 tablet by mouth 2 (two) times daily.   cholecalciferol (VITAMIN D) 400 UNITS TABS, Take 400-800 Units by mouth 2 (two) times daily. Pt takes 400 mg at night. 800 mg qam   doxycycline (VIBRA-TABS) 100 MG tablet, Take 1 tablet (100 mg total) by mouth 2 (two) times daily.   fish oil-omega-3 fatty acids 1000 MG capsule, Take 2 g by mouth 2 (two) times daily.   Multiple Vitamins-Minerals (MULTIVITAMIN WITH MINERALS) tablet, Take 1 tablet by mouth daily.   Multiple Vitamins-Minerals (OCUVITE PRESERVISION PO), Take 2 capsules by mouth daily.   Red Yeast Rice Extract 600 MG CAPS, Take 600 mg by mouth 2 (two) times daily.   Terbinafine HCl 1 % SOLN, Spray to feet and nails once daily for foot fungus   triamcinolone cream (KENALOG) 0.5 %, Apply 1 application topically 2 (two) times daily. To affected areas.   TURMERIC PO, Take 500 mg by mouth 2 (two) times daily.   Vitamin D, Ergocalciferol, (DRISDOL) 1.25 MG (50000 UT) CAPS capsule, TAKE 1 CAPSULE BY MOUTH EVERY 7 DAYS   Reviewed prior external information including notes and imaging from  primary care provider As well as notes that were available from care everywhere and other healthcare systems.  Past medical history, social, surgical and family history all reviewed in electronic medical record.  No pertanent information unless stated regarding to the chief complaint.   Review of Systems:  No headache, visual changes, nausea, vomiting, diarrhea, constipation, dizziness, abdominal pain, skin rash, fevers, chills, night sweats, weight loss, swollen lymph nodes,  chest pain, shortness of breath, mood changes. POSITIVE muscle aches, body aches, joint swelling  Objective  Blood pressure (!) 148/70, pulse 74, height '5\' 2"'$  (1.575 m), weight 139 lb (63 kg), SpO2 96 %.   General: No apparent distress alert and oriented x3 mood and affect normal, dressed appropriately.  Respiratory: Patient's speak in full sentences and does not  appear short of breath  Cardiovascular: 3+ pitting lower extremity edema, tender, no erythema  Patient's knee on the left side does have some instability with valgus and varus force.  Patient does have tenderness to palpation diffusely on the knee.  Good flexion but lacks the last 10 degrees of extension.    After informed written and verbal consent, patient was seated on exam table. Left knee was prepped with alcohol swab and utilizing anterolateral approach, patient's left knee space was injected with 4:1  marcaine 0.5%: Kenalog '40mg'$ /dL. Patient tolerated the procedure well without immediate complications. Impression and Recommendations:      The above documentation has  been reviewed and is accurate and complete Lyndal Pulley, DO

## 2021-07-20 ENCOUNTER — Ambulatory Visit (INDEPENDENT_AMBULATORY_CARE_PROVIDER_SITE_OTHER): Payer: Medicare (Managed Care) | Admitting: Family Medicine

## 2021-07-20 ENCOUNTER — Encounter: Payer: Self-pay | Admitting: Family Medicine

## 2021-07-20 DIAGNOSIS — M1712 Unilateral primary osteoarthritis, left knee: Secondary | ICD-10-CM

## 2021-07-20 NOTE — Patient Instructions (Addendum)
Injected L knee today See me again in 10-12 weeks

## 2021-07-20 NOTE — Assessment & Plan Note (Signed)
Patient has failed all other conservative therapy at this point.  Did discuss the possibility of PRP but do not know if it would be significantly beneficial with the severity of patient's symptoms.  This is a chronic problem that is worsening at the moment.  Discussed avoiding other activities when possible.  Follow-up with me again in 10 to 12 weeks otherwise for more injections if necessary.

## 2021-08-24 DIAGNOSIS — R600 Localized edema: Secondary | ICD-10-CM | POA: Diagnosis not present

## 2021-08-24 DIAGNOSIS — I10 Essential (primary) hypertension: Secondary | ICD-10-CM | POA: Diagnosis not present

## 2021-08-24 DIAGNOSIS — I872 Venous insufficiency (chronic) (peripheral): Secondary | ICD-10-CM | POA: Diagnosis not present

## 2021-09-07 DIAGNOSIS — I872 Venous insufficiency (chronic) (peripheral): Secondary | ICD-10-CM | POA: Diagnosis not present

## 2021-09-07 DIAGNOSIS — I10 Essential (primary) hypertension: Secondary | ICD-10-CM | POA: Diagnosis not present

## 2021-09-07 DIAGNOSIS — M179 Osteoarthritis of knee, unspecified: Secondary | ICD-10-CM | POA: Diagnosis not present

## 2021-09-14 DIAGNOSIS — I10 Essential (primary) hypertension: Secondary | ICD-10-CM | POA: Diagnosis not present

## 2021-09-14 DIAGNOSIS — R600 Localized edema: Secondary | ICD-10-CM | POA: Diagnosis not present

## 2021-09-27 NOTE — Progress Notes (Unsigned)
[place] Phone: [phone] Subjective:   [name], am serving as a scribe for Dr. Hulan Saas.   I'm seeing this patient by the request  of:  Wenda Low, MD  CC: left knee pain f/u  [id number]  07/20/2021 Patient has failed all other conservative therapy at this point.  Did discuss the possibility of PRP but do not know if it would be significantly beneficial with the severity of patient's symptoms.  This is a chronic problem that is worsening at the moment.  Discussed avoiding other activities when possible.  Follow-up with me again in 10 to 12 weeks otherwise for more injections if necessary.  Update 09/28/2021 Sarah Castro is a 81 y.o. female coming in with complaint of L knee pain. Patient states that she is in more pain since last visit. Pain with every step. No pain with sitting.       Past Medical History:  Diagnosis Date   Arthritis    Depression with anxiety    Hypercholesteremia    Hypertension    Macular degeneration    PONV (postoperative nausea and vomiting)    Stroke [facility])    Past Surgical History:  Procedure Laterality Date   APPENDECTOMY     broken back     BUNIONECTOMY  2013   CATARACT EXTRACTION     right   HAMMER TOE SURGERY  2013   HEMATOMA EVACUATION  5 years ago   IR RADIOLOGIST EVAL & MGMT  12/04/2019   KNEE ARTHROSCOPY     right   LAPAROSCOPIC APPENDECTOMY N/A 10/24/2012   Procedure: APPENDECTOMY LAPAROSCOPIC;  Surgeon: Rolm Bookbinder, MD;  Location: [place];  Service: General;  Laterality: N/A;   LAPAROSCOPIC APPENDECTOMY     POSTERIOR TIBIAL TENDON REPAIR  1998   REPLACEMENT TOTAL KNEE Right    TONSILLECTOMY   age 29   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   Procedure: TOTAL KNEE ARTHROPLASTY;  Surgeon: Mauri Pole, MD;  Location: [facility];  Service: Orthopedics;  Laterality: Right;   Social History   Socioeconomic History   Marital status:  Married    Spouse name: Not on file   Number of children: 2   Years of education: college   Highest education level: Bachelor's degree (e.g., BA, AB, BS)  Occupational History   Occupation: Retired  Tobacco Use   Smoking status: Former    Packs/day: 0.50    Years: 20.00    Total pack years: 10.00    Types: Cigarettes    Quit date: 02/07/2011    Years since quitting: 10.6   Smokeless tobacco: Never  Vaping Use   Vaping Use: Never used  Substance and Sexual Activity   Alcohol use: Not Currently    Comment: 2-3 glass of wine or vodka per day   Drug use: Never   Sexual activity: Not on file  Other Topics Concern   Not on file  Social History Narrative   Lives alone.   Left-handed.   One cup caffeine per day.   Social Determinants of Health   Financial Resource Strain: Not on file  Food Insecurity: Not on file  Transportation Needs: Not on file  Physical Activity: Not on file  Stress: Not on file  Social Connections: Not on file   Allergies  Allergen Reactions   Amlodipine Swelling   Hydrocodone Nausea And Vomiting   Sulfa Antibiotics Rash   Family History  Problem  Relation Age of Onset   Hypertension Mother    Hypertension Father    Cancer Father        throat   Throat cancer Father    Cancer Sister        non hodgkins lymphoma   Non-Hodgkin's lymphoma Sister    Non-Hodgkin's lymphoma Brother    Colon cancer Maternal Grandmother 42   Prostate cancer Maternal Uncle      Current Outpatient Medications (Cardiovascular):    amLODipine (NORVASC) 5 MG tablet, Take 1 tablet (5 mg total) by mouth daily.   hydrochlorothiazide (HYDRODIURIL) 25 MG tablet, Take 25 mg by mouth every morning.   lovastatin (MEVACOR) 40 MG tablet, Take 20 mg by mouth at bedtime.   metoprolol succinate (TOPROL-XL) 25 MG 24 hr tablet, Take 25 mg by mouth daily.   olmesartan (BENICAR) 40 MG tablet, Take 40 mg by mouth daily.     Current Outpatient Medications (Other):    AMBULATORY NON  FORMULARY MEDICATION, Rolling walker   Calcium Carbonate-Vitamin D (CALCIUM 600 + D PO), Take 1 tablet by mouth 2 (two) times daily.   cholecalciferol (VITAMIN D) 400 UNITS TABS, Take 400-800 Units by mouth 2 (two) times daily. Pt takes 400 mg at night. 800 mg qam   doxycycline (VIBRA-TABS) 100 MG tablet, Take 1 tablet (100 mg total) by mouth 2 (two) times daily.   fish oil-omega-3 fatty acids 1000 MG capsule, Take 2 g by mouth 2 (two) times daily.   Multiple Vitamins-Minerals (MULTIVITAMIN WITH MINERALS) tablet, Take 1 tablet by mouth daily.   Multiple Vitamins-Minerals (OCUVITE PRESERVISION PO), Take 2 capsules by mouth daily.   Red Yeast Rice Extract 600 MG CAPS, Take 600 mg by mouth 2 (two) times daily.   Terbinafine HCl 1 % SOLN, Spray to feet and nails once daily for foot fungus   triamcinolone cream (KENALOG) 0.5 %, Apply 1 application topically 2 (two) times daily. To affected areas.   TURMERIC PO, Take 500 mg by mouth 2 (two) times daily.   Vitamin D, Ergocalciferol, (DRISDOL) 1.25 MG (50000 UT) CAPS capsule, TAKE 1 CAPSULE BY MOUTH EVERY 7 DAYS   Reviewed prior external information including notes and imaging from  primary care provider As well as notes that were available from care everywhere and other healthcare systems.  Past medical history, social, surgical and family history all reviewed in electronic medical record.  No pertanent information unless stated regarding to the chief complaint.   Review of Systems:  No headache, visual changes, nausea, vomiting, diarrhea, constipation, dizziness, abdominal pain, skin rash, fevers, chills, night sweats, weight loss, swollen lymph nodes,  chest pain, shortness of breath, mood changes. POSITIVE muscle aches, body aches, joint swelling  Objective  Blood pressure (!) 158/82, pulse 73, height '5\' 2"'$  (1.575 m), weight 137 lb (62.1 kg), SpO2 98 %.   General: No apparent distress alert and oriented x3 mood and affect normal, dressed  appropriately.  Respiratory: Patient's speak in full sentences and does not appear short of breath  Cardiovascular: 2+ lower extremity edema, tender, no erythema  Knee exam shows severe effusion, instability noted limited ROM as noted.  Significant effusion noted of the left knee.  Patient has limited range of motion noted in all planes  After informed written and verbal consent, patient was seated on exam table. Left knee was prepped with alcohol swab and utilizing anterolateral approach, patient's left knee space was injected with 4:1  marcaine 0.5%: Kenalog '40mg'$ /dL. Patient tolerated the procedure well  without immediate complications.    Impression and Recommendations:    The above documentation has been reviewed and is accurate and complete Lyndal Pulley, DO

## 2021-09-28 ENCOUNTER — Encounter: Payer: Self-pay | Admitting: Family Medicine

## 2021-09-28 ENCOUNTER — Ambulatory Visit (INDEPENDENT_AMBULATORY_CARE_PROVIDER_SITE_OTHER): Payer: Medicare (Managed Care) | Admitting: Family Medicine

## 2021-09-28 VITALS — BP 158/82 | HR 73 | Ht 62.0 in | Wt 137.0 lb

## 2021-09-28 DIAGNOSIS — M1712 Unilateral primary osteoarthritis, left knee: Secondary | ICD-10-CM | POA: Diagnosis not present

## 2021-09-28 NOTE — Patient Instructions (Addendum)
Dr. Aurea Graff office will call you Injected knee today See me in 3 months

## 2021-09-28 NOTE — Assessment & Plan Note (Signed)
Worsening pain in this chronic problem.  No longer responding to the injections.  Not having more pain even with the injections.  Increasing instability.  Would like to be referred to orthopedic surgery to discuss the possibility of surgical intervention.  Did warn patient is considered a fairly high risk surgical candidate and that physical therapy will be difficult but at the moment patient's quality of life is fairly limited.  Patient is accompanied with daughter and agree that they should discuss the surgical intervention and will refer at this moment.  Follow-up with me otherwise in 3 months to make sure other complaints are doing well.

## 2021-10-04 DIAGNOSIS — I10 Essential (primary) hypertension: Secondary | ICD-10-CM | POA: Diagnosis not present

## 2021-10-04 DIAGNOSIS — M179 Osteoarthritis of knee, unspecified: Secondary | ICD-10-CM | POA: Diagnosis not present

## 2021-11-03 DIAGNOSIS — Z23 Encounter for immunization: Secondary | ICD-10-CM | POA: Diagnosis not present

## 2021-12-05 DIAGNOSIS — I10 Essential (primary) hypertension: Secondary | ICD-10-CM | POA: Diagnosis not present

## 2021-12-26 ENCOUNTER — Ambulatory Visit: Payer: Medicare (Managed Care) | Admitting: Family Medicine

## 2022-01-06 NOTE — Progress Notes (Unsigned)
[facility] [place] Phone: [phone] Subjective:   [name], am serving as a scribe for Dr. Hulan Saas.  I'm seeing this patient by the request  of:  Wenda Low, MD  CC: Left knee pain pain  [id number]  09/28/2021 Worsening pain in this chronic problem.  No longer responding to the injections.  Not having more pain even with the injections.  Increasing instability.  Would like to be referred to orthopedic surgery to discuss the possibility of surgical intervention.  Did warn patient is considered a fairly high risk surgical candidate and that physical therapy will be difficult but at the moment patient's quality of life is fairly limited.  Patient is accompanied with daughter and agree that they should discuss the surgical intervention and will refer at this moment.  Follow-up with me otherwise in 3 months to make sure other complaints are doing well.       Update 01/10/2022 Sarah Castro is a 81 y.o. female coming in with complaint of L knee pain. Patient states that it keeps having instability as well continues to walk with a cane had seen another provider and is considering surgery.  Since we have seen patient patient did go see the orthopedic surgeon.  Was to consider the possibility of surgical intervention.     Past Medical History:  Diagnosis Date   Arthritis    Depression with anxiety    Hypercholesteremia    Hypertension    Macular degeneration    PONV (postoperative nausea and vomiting)    Stroke [facility])    Past Surgical History:  Procedure Laterality Date   APPENDECTOMY     broken back     BUNIONECTOMY  2013   CATARACT EXTRACTION     right   HAMMER TOE SURGERY  2013   HEMATOMA EVACUATION  5 years ago   IR RADIOLOGIST EVAL & MGMT  12/04/2019   KNEE ARTHROSCOPY     right   LAPAROSCOPIC APPENDECTOMY N/A 10/24/2012   Procedure: APPENDECTOMY LAPAROSCOPIC;  Surgeon: Rolm Bookbinder,  MD;  Location: [facility];  Service: General;  Laterality: N/A;   LAPAROSCOPIC APPENDECTOMY     POSTERIOR TIBIAL TENDON REPAIR  1998   REPLACEMENT TOTAL KNEE Right    TONSILLECTOMY   age 65   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   Procedure: TOTAL KNEE ARTHROPLASTY;  Surgeon: Mauri Pole, MD;  Location: [facility];  Service: Orthopedics;  Laterality: Right;   Social History   Socioeconomic History   Marital status: Married    Spouse name: Not on file   Number of children: 2   Years of education: college   Highest education level: Bachelor's degree (e.g., BA, AB, BS)  Occupational History   Occupation: Retired  Tobacco Use   Smoking status: Former    Packs/day: 0.50    Years: 20.00    Total pack years: 10.00    Types: Cigarettes    Quit date: 02/07/2011    Years since quitting: 10.9   Smokeless tobacco: Never  Vaping Use   Vaping Use: Never used  Substance and Sexual Activity   Alcohol use: Not Currently    Comment: 2-3 glass of wine or vodka per day   Drug use: Never   Sexual activity: Not on file  Other Topics Concern   Not on file  Social History Narrative   Lives alone.   Left-handed.   One cup caffeine per day.  Social Determinants of Health   Financial Resource Strain: Not on file  Food Insecurity: Not on file  Transportation Needs: Not on file  Physical Activity: Not on file  Stress: Not on file  Social Connections: Not on file   Allergies  Allergen Reactions   Amlodipine Swelling   Hydrocodone Nausea And Vomiting   Sulfa Antibiotics Rash   Family History  Problem Relation Age of Onset   Hypertension Mother    Hypertension Father    Cancer Father        throat   Throat cancer Father    Cancer Sister        non hodgkins lymphoma   Non-Hodgkin's lymphoma Sister    Non-Hodgkin's lymphoma Brother    Colon cancer Maternal Grandmother 38   Prostate cancer Maternal Uncle      Current Outpatient Medications (Cardiovascular):    amLODipine (NORVASC) 5  MG tablet, Take 1 tablet (5 mg total) by mouth daily.   hydrochlorothiazide (HYDRODIURIL) 25 MG tablet, Take 25 mg by mouth every morning.   lovastatin (MEVACOR) 40 MG tablet, Take 20 mg by mouth at bedtime.   metoprolol succinate (TOPROL-XL) 25 MG 24 hr tablet, Take 25 mg by mouth daily.   olmesartan (BENICAR) 40 MG tablet, Take 40 mg by mouth daily.     Current Outpatient Medications (Other):    AMBULATORY NON FORMULARY MEDICATION, Rolling walker   Calcium Carbonate-Vitamin D (CALCIUM 600 + D PO), Take 1 tablet by mouth 2 (two) times daily.   cholecalciferol (VITAMIN D) 400 UNITS TABS, Take 400-800 Units by mouth 2 (two) times daily. Pt takes 400 mg at night. 800 mg qam   doxycycline (VIBRA-TABS) 100 MG tablet, Take 1 tablet (100 mg total) by mouth 2 (two) times daily.   fish oil-omega-3 fatty acids 1000 MG capsule, Take 2 g by mouth 2 (two) times daily.   Multiple Vitamins-Minerals (MULTIVITAMIN WITH MINERALS) tablet, Take 1 tablet by mouth daily.   Multiple Vitamins-Minerals (OCUVITE PRESERVISION PO), Take 2 capsules by mouth daily.   Red Yeast Rice Extract 600 MG CAPS, Take 600 mg by mouth 2 (two) times daily.   Terbinafine HCl 1 % SOLN, Spray to feet and nails once daily for foot fungus   triamcinolone cream (KENALOG) 0.5 %, Apply 1 application topically 2 (two) times daily. To affected areas.   TURMERIC PO, Take 500 mg by mouth 2 (two) times daily.   Vitamin D, Ergocalciferol, (DRISDOL) 1.25 MG (50000 UT) CAPS capsule, TAKE 1 CAPSULE BY MOUTH EVERY 7 DAYS   Reviewed prior external information including notes and imaging from  primary care provider As well as notes that were available from care everywhere and other healthcare systems.  Past medical history, social, surgical and family history all reviewed in electronic medical record.  No pertanent information unless stated regarding to the chief complaint.   Review of Systems:  No headache, visual changes, nausea, vomiting,  diarrhea, constipation, dizziness, abdominal pain, skin rash, fevers, chills, night sweats, weight loss, swollen lymph nodes,  chest pain, shortness of breath, mood changes. POSITIVE muscle aches, body aches, joint swelling  Objective  Blood pressure (!) 122/50, pulse (!) 58, height '5\' 2"'$  (1.575 m), weight 143 lb (64.9 kg), SpO2 96 %.   General: No apparent distress alert and oriented x3 mood and affect normal, dressed appropriately.  HEENT: Pupils equal, extraocular movements intact  Respiratory: Patient's speak in full sentences and does not appear short of breath  Cardiovascular: 2+ lower extremity edema,  non tender, no erythema  Severely antalgic using a cane to help with walking.  Has significant instability of the left knee noted.  Significant crepitus noted.  Does have a chronic effusion noted.  After informed written and verbal consent, patient was seated on exam table. Left knee was prepped with alcohol swab and utilizing anterolateral approach, patient's left knee space was injected with 4:1  marcaine 0.5%: Kenalog '40mg'$ /dL. Patient tolerated the procedure well without immediate complications.    Impression and Recommendations:     The above documentation has been reviewed and is accurate and complete Lyndal Pulley, DO

## 2022-01-10 ENCOUNTER — Encounter: Payer: Self-pay | Admitting: Family Medicine

## 2022-01-10 ENCOUNTER — Ambulatory Visit (INDEPENDENT_AMBULATORY_CARE_PROVIDER_SITE_OTHER): Payer: Medicare (Managed Care) | Admitting: Family Medicine

## 2022-01-10 VITALS — BP 122/50 | HR 58 | Ht 62.0 in | Wt 143.0 lb

## 2022-01-10 DIAGNOSIS — M1712 Unilateral primary osteoarthritis, left knee: Secondary | ICD-10-CM | POA: Diagnosis not present

## 2022-01-10 DIAGNOSIS — E871 Hypo-osmolality and hyponatremia: Secondary | ICD-10-CM | POA: Diagnosis not present

## 2022-01-10 DIAGNOSIS — E782 Mixed hyperlipidemia: Secondary | ICD-10-CM | POA: Diagnosis not present

## 2022-01-10 DIAGNOSIS — I639 Cerebral infarction, unspecified: Secondary | ICD-10-CM | POA: Diagnosis not present

## 2022-01-10 DIAGNOSIS — Z1331 Encounter for screening for depression: Secondary | ICD-10-CM | POA: Diagnosis not present

## 2022-01-10 DIAGNOSIS — R54 Age-related physical debility: Secondary | ICD-10-CM | POA: Diagnosis not present

## 2022-01-10 DIAGNOSIS — I700 Atherosclerosis of aorta: Secondary | ICD-10-CM | POA: Diagnosis not present

## 2022-01-10 DIAGNOSIS — M1990 Unspecified osteoarthritis, unspecified site: Secondary | ICD-10-CM | POA: Diagnosis not present

## 2022-01-10 DIAGNOSIS — Z0000 Encounter for general adult medical examination without abnormal findings: Secondary | ICD-10-CM | POA: Diagnosis not present

## 2022-01-10 DIAGNOSIS — M8580 Other specified disorders of bone density and structure, unspecified site: Secondary | ICD-10-CM | POA: Diagnosis not present

## 2022-01-10 DIAGNOSIS — M5136 Other intervertebral disc degeneration, lumbar region: Secondary | ICD-10-CM | POA: Diagnosis not present

## 2022-01-10 DIAGNOSIS — I872 Venous insufficiency (chronic) (peripheral): Secondary | ICD-10-CM | POA: Diagnosis not present

## 2022-01-10 DIAGNOSIS — I10 Essential (primary) hypertension: Secondary | ICD-10-CM | POA: Diagnosis not present

## 2022-01-10 DIAGNOSIS — R7303 Prediabetes: Secondary | ICD-10-CM | POA: Diagnosis not present

## 2022-01-10 NOTE — Assessment & Plan Note (Signed)
Chronic, with worsening symptoms.  Likely will be having a replacement in the future.  Discussed with patient about icing regimen and home exercises, which activities to do and which ones to avoid.  Increase activity slowly.  Follow-up again in 3 months if needed but hopefully we may be considering the beforehand.Marland Kitchen

## 2022-03-06 DIAGNOSIS — I872 Venous insufficiency (chronic) (peripheral): Secondary | ICD-10-CM | POA: Diagnosis not present

## 2022-03-06 DIAGNOSIS — M5136 Other intervertebral disc degeneration, lumbar region: Secondary | ICD-10-CM | POA: Diagnosis not present

## 2022-03-06 DIAGNOSIS — I10 Essential (primary) hypertension: Secondary | ICD-10-CM | POA: Diagnosis not present

## 2022-03-06 DIAGNOSIS — M1990 Unspecified osteoarthritis, unspecified site: Secondary | ICD-10-CM | POA: Diagnosis not present

## 2022-03-27 DIAGNOSIS — I10 Essential (primary) hypertension: Secondary | ICD-10-CM | POA: Diagnosis not present

## 2022-05-16 DIAGNOSIS — R7303 Prediabetes: Secondary | ICD-10-CM | POA: Diagnosis not present

## 2022-05-16 DIAGNOSIS — I679 Cerebrovascular disease, unspecified: Secondary | ICD-10-CM | POA: Diagnosis not present

## 2022-05-16 DIAGNOSIS — M5136 Other intervertebral disc degeneration, lumbar region: Secondary | ICD-10-CM | POA: Diagnosis not present

## 2022-05-16 DIAGNOSIS — I272 Pulmonary hypertension, unspecified: Secondary | ICD-10-CM | POA: Diagnosis not present

## 2022-05-16 DIAGNOSIS — I700 Atherosclerosis of aorta: Secondary | ICD-10-CM | POA: Diagnosis not present

## 2022-05-16 DIAGNOSIS — I10 Essential (primary) hypertension: Secondary | ICD-10-CM | POA: Diagnosis not present

## 2022-05-16 DIAGNOSIS — F325 Major depressive disorder, single episode, in full remission: Secondary | ICD-10-CM | POA: Diagnosis not present

## 2022-05-16 DIAGNOSIS — R269 Unspecified abnormalities of gait and mobility: Secondary | ICD-10-CM | POA: Diagnosis not present

## 2022-05-16 DIAGNOSIS — J439 Emphysema, unspecified: Secondary | ICD-10-CM | POA: Diagnosis not present

## 2022-05-16 DIAGNOSIS — E782 Mixed hyperlipidemia: Secondary | ICD-10-CM | POA: Diagnosis not present

## 2022-06-16 DIAGNOSIS — M1712 Unilateral primary osteoarthritis, left knee: Secondary | ICD-10-CM | POA: Diagnosis not present

## 2022-07-25 DIAGNOSIS — Z01818 Encounter for other preprocedural examination: Secondary | ICD-10-CM | POA: Diagnosis not present

## 2022-07-25 DIAGNOSIS — I10 Essential (primary) hypertension: Secondary | ICD-10-CM | POA: Diagnosis not present

## 2022-07-25 DIAGNOSIS — I872 Venous insufficiency (chronic) (peripheral): Secondary | ICD-10-CM | POA: Diagnosis not present

## 2022-07-25 DIAGNOSIS — M179 Osteoarthritis of knee, unspecified: Secondary | ICD-10-CM | POA: Diagnosis not present

## 2022-08-01 DIAGNOSIS — M79671 Pain in right foot: Secondary | ICD-10-CM | POA: Diagnosis not present

## 2022-08-04 NOTE — Progress Notes (Unsigned)
[name] [address] [place] [address] Phone: [phone] Subjective:   [name], am serving as a scribe for Dr. Antoine Primas.  I'm seeing this patient by the request  of:  Georgann Housekeeper, MD  CC: Right foot pain  ION:[id number]  Sarah Castro is a 82 y.o. female coming in with complaint of R foot pain. Last seen for L knee pain in December 2023. TKR schedule for July 2024. Patient states had xray at [facility] last week. Pain started a couple of weeks ago. Sharp at times, pain not consistent. No home treatment has helped.       Past Medical History:  Diagnosis Date   Arthritis    Depression with anxiety    Hypercholesteremia    Hypertension    Macular degeneration    PONV (postoperative nausea and vomiting)    Stroke [facility])    Past Surgical History:  Procedure Laterality Date   APPENDECTOMY     broken back     BUNIONECTOMY  2013   CATARACT EXTRACTION     right   HAMMER TOE SURGERY  2013   HEMATOMA EVACUATION  5 years ago   IR RADIOLOGIST EVAL & MGMT  12/04/2019   KNEE ARTHROSCOPY     right   LAPAROSCOPIC APPENDECTOMY N/A 10/24/2012   Procedure: APPENDECTOMY LAPAROSCOPIC;  Surgeon: Emelia Loron, MD;  Location: [facility];  Service: General;  Laterality: N/A;   LAPAROSCOPIC APPENDECTOMY     POSTERIOR TIBIAL TENDON REPAIR  1998   REPLACEMENT TOTAL KNEE Right    TONSILLECTOMY   age 89   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   Procedure: TOTAL KNEE ARTHROPLASTY;  Surgeon: [name], MD;  Location: [facility];  Service: Orthopedics;  Laterality: Right;   Social History   Socioeconomic History   Marital status: Married    Spouse name: Not on file   Number of children: 2   Years of education: college   Highest education level: Bachelor's degree (e.g., BA, AB, BS)  Occupational History   Occupation: Retired  Tobacco Use   Smoking status: Former    Packs/day: 0.50    Years: 20.00    Additional pack years: 0.00     Total pack years: 10.00    Types: Cigarettes    Quit date: 02/07/2011    Years since quitting: 11.5   Smokeless tobacco: Never  Vaping Use   Vaping Use: Never used  Substance and Sexual Activity   Alcohol use: Not Currently    Comment: 2-3 glass of wine or vodka per day   Drug use: Never   Sexual activity: Not on file  Other Topics Concern   Not on file  Social History Narrative   Lives alone.   Left-handed.   One cup caffeine per day.   Social Determinants of Health   Financial Resource Strain: Not on file  Food Insecurity: Not on file  Transportation Needs: Not on file  Physical Activity: Not on file  Stress: Not on file  Social Connections: Not on file   Allergies  Allergen Reactions   Amlodipine Swelling   Hydrocodone Nausea And Vomiting   Sulfa Antibiotics Rash   Family History  Problem Relation Age of Onset   Hypertension Mother    Hypertension Father    Cancer Father        throat   Throat cancer Father    Cancer Sister        non hodgkins lymphoma  Non-Hodgkin's lymphoma Sister    Non-Hodgkin's lymphoma Brother    Colon cancer Maternal Grandmother 42   Prostate cancer Maternal Uncle      Current Outpatient Medications (Cardiovascular):    amLODipine (NORVASC) 5 MG tablet, Take 1 tablet (5 mg total) by mouth daily.   hydrochlorothiazide (HYDRODIURIL) 25 MG tablet, Take 25 mg by mouth every morning.   lovastatin (MEVACOR) 40 MG tablet, Take 20 mg by mouth at bedtime.   metoprolol succinate (TOPROL-XL) 25 MG 24 hr tablet, Take 25 mg by mouth daily.   olmesartan (BENICAR) 40 MG tablet, Take 40 mg by mouth daily.     Current Outpatient Medications (Other):    AMBULATORY NON FORMULARY MEDICATION, Rolling walker   Calcium Carbonate-Vitamin D (CALCIUM 600 + D PO), Take 1 tablet by mouth 2 (two) times daily.   cholecalciferol (VITAMIN D) 400 UNITS TABS, Take 400-800 Units by mouth 2 (two) times daily. Pt takes 400 mg at night. 800 mg qam   doxycycline  (VIBRA-TABS) 100 MG tablet, Take 1 tablet (100 mg total) by mouth 2 (two) times daily.   fish oil-omega-3 fatty acids 1000 MG capsule, Take 2 g by mouth 2 (two) times daily.   Multiple Vitamins-Minerals (MULTIVITAMIN WITH MINERALS) tablet, Take 1 tablet by mouth daily.   Multiple Vitamins-Minerals (OCUVITE PRESERVISION PO), Take 2 capsules by mouth daily.   Red Yeast Rice Extract 600 MG CAPS, Take 600 mg by mouth 2 (two) times daily.   Terbinafine HCl 1 % SOLN, Spray to feet and nails once daily for foot fungus   triamcinolone cream (KENALOG) 0.5 %, Apply 1 application topically 2 (two) times daily. To affected areas.   TURMERIC PO, Take 500 mg by mouth 2 (two) times daily.   Vitamin D, Ergocalciferol, (DRISDOL) 1.25 MG (50000 UT) CAPS capsule, TAKE 1 CAPSULE BY MOUTH EVERY 7 DAYS   Reviewed prior external information including notes and imaging from  primary care provider As well as notes that were available from care everywhere and other healthcare systems.  Past medical history, social, surgical and family history all reviewed in electronic medical record.  No pertanent information unless stated regarding to the chief complaint.   Review of Systems:  No headache, visual changes, nausea, vomiting, diarrhea, constipation, dizziness, abdominal pain, skin rash, fevers, chills, night sweats, weight loss, swollen lymph nodes,  chest pain, shortness of breath, mood changes. POSITIVE muscle aches, body aches, joint swelling, lower extremity swelling  Objective  Blood pressure (!) 146/74, pulse 79, height 5\' 2"  (1.575 m), weight 138 lb (62.6 kg), SpO2 93 %.   General: No apparent distress alert and oriented x3 mood and affect normal, dressed appropriately.  HEENT: Pupils equal, extraocular movements intact  Respiratory: Patient's speak in full sentences and does not appear short of breath  Severe antalgic gait noted.  Patient does have 2+ pitting edema in the lower extremities.  Patient does have  tenderness to palpation over the medial aspect of the ankle and foot.  Patient does have significant breakdown of the longitudinal and transverse arch of the foot  Limited muscular skeletal ultrasound was performed and interpreted by Antoine Primas, M  Limited ultrasound shows the patient does have narrowing of the medial joint space of follow-up of the foot.  Seems to be consistent with arthritis.  Patient does have what appears to be a fairly large intersubstance tearing noted of the posterior tibialis tendon with some mild retraction noted at the insertion of the navicular bone. Impression: Posterior tibialis  tear    Impression and Recommendations:     The above documentation has been reviewed and is accurate and complete Judi Saa, DO

## 2022-08-07 ENCOUNTER — Ambulatory Visit (INDEPENDENT_AMBULATORY_CARE_PROVIDER_SITE_OTHER): Payer: Medicare (Managed Care) | Admitting: Family Medicine

## 2022-08-07 ENCOUNTER — Other Ambulatory Visit: Payer: Self-pay

## 2022-08-07 ENCOUNTER — Encounter: Payer: Self-pay | Admitting: Family Medicine

## 2022-08-07 VITALS — BP 146/74 | HR 79 | Ht 62.0 in | Wt 138.0 lb

## 2022-08-07 DIAGNOSIS — M79671 Pain in right foot: Secondary | ICD-10-CM | POA: Diagnosis not present

## 2022-08-07 DIAGNOSIS — M25562 Pain in left knee: Secondary | ICD-10-CM | POA: Diagnosis not present

## 2022-08-07 DIAGNOSIS — M66871 Spontaneous rupture of other tendons, right ankle and foot: Secondary | ICD-10-CM

## 2022-08-07 NOTE — Assessment & Plan Note (Signed)
Appears to be chronic, discussed heel lift, icing regimen, home exercises.  Worsening symptoms can consider injection.  With patient's comorbidities would not be a surgical candidate.  Patient is actually going to be scheduled for surgery with a left total knee replacement at this moment.  Will see patient again in 6 weeks after surgery.

## 2022-08-07 NOTE — Patient Instructions (Signed)
Do prescribed exercises at least 3x a week Carbon fiber plate You have 14 days to return or exchange your brace Call [phone], then return the brace to our office Ice at end of day Arnica lotion See you again in 4-6 weeks

## 2022-08-11 NOTE — Patient Instructions (Signed)
DUE TO COVID-19 ONLY TWO VISITORS  (aged 82 and older)  ARE ALLOWED TO COME WITH YOU AND STAY IN THE WAITING ROOM ONLY DURING PRE OP AND PROCEDURE.   **NO VISITORS ARE ALLOWED IN THE SHORT STAY AREA OR RECOVERY ROOM!!**  IF YOU WILL BE ADMITTED INTO THE HOSPITAL YOU ARE ALLOWED ONLY FOUR SUPPORT PEOPLE DURING VISITATION HOURS ONLY (7 AM -8PM)   The support person(s) must pass our screening, gel in and out, and wear a mask at all times, including in the patient's room. Patients must also wear a mask when staff or their support person are in the room. Visitors GUEST BADGE MUST BE WORN VISIBLY  One adult visitor may remain with you overnight and MUST be in the room by 8 P.M.     Your procedure is scheduled on: 08/24/22   Report to [facility] Main Entrance    Report to admitting at : 9:00 AM   Call this number if you have problems the morning of surgery [phone]   Do not eat food :After Midnight.   After Midnight you may have the following liquids until : 8:30 AM DAY OF SURGERY  Water Black Coffee (sugar ok, NO MILK/CREAM OR CREAMERS)  Tea (sugar ok, NO MILK/CREAM OR CREAMERS) regular and decaf                             Plain Jell-O (NO RED)                                           Fruit ices (not with fruit pulp, NO RED)                                     Popsicles (NO RED)                                                                  Juice: apple, WHITE grape, WHITE cranberry Sports drinks like Gatorade (NO RED)   The day of surgery:  Drink ONE (1) Pre-Surgery Clear Ensure at : 8:30 AM the morning of surgery. Drink in one sitting. Do not sip.  This drink was given to you during your hospital  pre-op appointment visit. Nothing else to drink after completing the  Pre-Surgery Clear Ensure or G2.          If you have questions, please contact your surgeon's office.   Oral Hygiene is also important to reduce your risk of infection.                                     Remember - BRUSH YOUR TEETH THE MORNING OF SURGERY WITH YOUR REGULAR TOOTHPASTE  DENTURES WILL BE REMOVED PRIOR TO SURGERY PLEASE DO NOT APPLY "Poly grip" OR ADHESIVES!!!   Do NOT smoke after Midnight   Take these medicines the morning of surgery with A SIP OF WATER: escitalopram,doxazosin . Clonidine (as needed)  You may not have any metal on your body including hair pins, jewelry, and body piercing             Do not wear make-up, lotions, powders, perfumes/cologne, or deodorant  Do not wear nail polish including gel and S&S, artificial/acrylic nails, or any other type of covering on natural nails including finger and toenails. If you have artificial nails, gel coating, etc. that needs to be removed by a nail salon please have this removed prior to surgery or surgery may need to be canceled/ delayed if the surgeon/ anesthesia feels like they are unable to be safely monitored.   Do not shave  48 hours prior to surgery.   Do not bring valuables to the hospital. [facility] IS NOT             RESPONSIBLE   FOR VALUABLES.   Contacts, glasses, or bridgework may not be worn into surgery.   Bring small overnight bag day of surgery.   DO NOT BRING YOUR HOME MEDICATIONS TO THE HOSPITAL. PHARMACY WILL DISPENSE MEDICATIONS LISTED ON YOUR MEDICATION LIST TO YOU DURING YOUR ADMISSION IN THE HOSPITAL!    Patients discharged on the day of surgery will not be allowed to drive home.  Someone NEEDS to stay with you for the first 24 hours after anesthesia.   Special Instructions: Bring a copy of your healthcare power of attorney and living will documents         the day of surgery if you haven't scanned them before.              Please read over the following fact sheets you were given: IF YOU HAVE QUESTIONS ABOUT YOUR PRE-OP INSTRUCTIONS PLEASE CALL [phone]      Pre-operative 5 CHG Bath Instructions   You can play a key role in reducing the risk of infection  after surgery. Your skin needs to be as free of germs as possible. You can reduce the number of germs on your skin by washing with CHG (chlorhexidine gluconate) soap before surgery. CHG is an antiseptic soap that kills germs and continues to kill germs even after washing.   DO NOT use if you have an allergy to chlorhexidine/CHG or antibacterial soaps. If your skin becomes reddened or irritated, stop using the CHG and notify one of our RNs at : [phone].   Please shower with the CHG soap starting 4 days before surgery using the following schedule:     Please keep in mind the following:  DO NOT shave, including legs and underarms, starting the day of your first shower.   You may shave your face at any point before/day of surgery.  Place clean sheets on your bed the day you start using CHG soap. Use a clean washcloth (not used since being washed) for each shower. DO NOT sleep with pets once you start using the CHG.   CHG Shower Instructions:  If you choose to wash your hair and private area, wash first with your normal shampoo/soap.  After you use shampoo/soap, rinse your hair and body thoroughly to remove shampoo/soap residue.  Turn the water OFF and apply about 3 tablespoons (45 ml) of CHG soap to a CLEAN washcloth.  Apply CHG soap ONLY FROM YOUR NECK DOWN TO YOUR TOES (washing for 3-5 minutes)  DO NOT use CHG soap on face, private areas, open wounds, or sores.  Pay special attention to the area where your surgery is being performed.  If  you are having back surgery, having someone wash your back for you may be helpful. Wait 2 minutes after CHG soap is applied, then you may rinse off the CHG soap.  Pat dry with a clean towel  Put on clean clothes/pajamas   If you choose to wear lotion, please use ONLY the CHG-compatible lotions on the back of this paper.     Additional instructions for the day of surgery: DO NOT APPLY any lotions, deodorants, cologne, or perfumes.   Put on  clean/comfortable clothes.  Brush your teeth.  Ask your nurse before applying any prescription medications to the skin.      CHG Compatible Lotions   Aveeno Moisturizing lotion  Cetaphil Moisturizing Cream  Cetaphil Moisturizing Lotion  Clairol Herbal Essence Moisturizing Lotion, Dry Skin  Clairol Herbal Essence Moisturizing Lotion, Extra Dry Skin  Clairol Herbal Essence Moisturizing Lotion, Normal Skin  Curel Age Defying Therapeutic Moisturizing Lotion with Alpha Hydroxy  Curel Extreme Care Body Lotion  Curel Soothing Hands Moisturizing Hand Lotion  Curel Therapeutic Moisturizing Cream, Fragrance-Free  Curel Therapeutic Moisturizing Lotion, Fragrance-Free  Curel Therapeutic Moisturizing Lotion, Original Formula  Eucerin Daily Replenishing Lotion  Eucerin Dry Skin Therapy Plus Alpha Hydroxy Crme  Eucerin Dry Skin Therapy Plus Alpha Hydroxy Lotion  Eucerin Original Crme  Eucerin Original Lotion  Eucerin Plus Crme Eucerin Plus Lotion  Eucerin TriLipid Replenishing Lotion  Keri Anti-Bacterial Hand Lotion  Keri Deep Conditioning Original Lotion Dry Skin Formula Softly Scented  Keri Deep Conditioning Original Lotion, Fragrance Free Sensitive Skin Formula  Keri Lotion Fast Absorbing Fragrance Free Sensitive Skin Formula  Keri Lotion Fast Absorbing Softly Scented Dry Skin Formula  Keri Original Lotion  Keri Skin Renewal Lotion Keri Silky Smooth Lotion  Keri Silky Smooth Sensitive Skin Lotion  Nivea Body Creamy Conditioning Oil  Nivea Body Extra Enriched Lotion  Nivea Body Original Lotion  Nivea Body Sheer Moisturizing Lotion Nivea Crme  Nivea Skin Firming Lotion  NutraDerm 30 Skin Lotion  NutraDerm Skin Lotion  NutraDerm Therapeutic Skin Cream  NutraDerm Therapeutic Skin Lotion  ProShield Protective Hand Cream  Provon moisturizing lotion   Incentive Spirometer  An incentive spirometer is a tool that can help keep your lungs clear and active. This tool measures how well  you are filling your lungs with each breath. Taking long deep breaths may help reverse or decrease the chance of developing breathing (pulmonary) problems (especially infection) following: A long period of time when you are unable to move or be active. BEFORE THE PROCEDURE  If the spirometer includes an indicator to show your best effort, your nurse or respiratory therapist will set it to a desired goal. If possible, sit up straight or lean slightly forward. Try not to slouch. Hold the incentive spirometer in an upright position. INSTRUCTIONS FOR USE  Sit on the edge of your bed if possible, or sit up as far as you can in bed or on a chair. Hold the incentive spirometer in an upright position. Breathe out normally. Place the mouthpiece in your mouth and seal your lips tightly around it. Breathe in slowly and as deeply as possible, raising the piston or the ball toward the top of the column. Hold your breath for 3-5 seconds or for as long as possible. Allow the piston or ball to fall to the bottom of the column. Remove the mouthpiece from your mouth and breathe out normally. Rest for a few seconds and repeat Steps 1 through 7 at least 10 times every 1-2  hours when you are awake. Take your time and take a few normal breaths between deep breaths. The spirometer may include an indicator to show your best effort. Use the indicator as a goal to work toward during each repetition. After each set of 10 deep breaths, practice coughing to be sure your lungs are clear. If you have an incision (the cut made at the time of surgery), support your incision when coughing by placing a pillow or rolled up towels firmly against it. Once you are able to get out of bed, walk around indoors and cough well. You may stop using the incentive spirometer when instructed by your caregiver.  RISKS AND COMPLICATIONS Take your time so you do not get dizzy or light-headed. If you are in pain, you may need to take or ask for  pain medication before doing incentive spirometry. It is harder to take a deep breath if you are having pain. AFTER USE Rest and breathe slowly and easily. It can be helpful to keep track of a log of your progress. Your caregiver can provide you with a simple table to help with this. If you are using the spirometer at home, follow these instructions: SEEK MEDICAL CARE IF:  You are having difficultly using the spirometer. You have trouble using the spirometer as often as instructed. Your pain medication is not giving enough relief while using the spirometer. You develop fever of 100.5 F (38.1 C) or higher. SEEK IMMEDIATE MEDICAL CARE IF:  You cough up bloody sputum that had not been present before. You develop fever of 102 F (38.9 C) or greater. You develop worsening pain at or near the incision site. MAKE SURE YOU:  Understand these instructions. Will watch your condition. Will get help right away if you are not doing well or get worse. Document Released: 06/05/2006 Document Revised: 04/17/2011 Document Reviewed: 08/06/2006 [facility] Patient Information 2014 [place], [place].   ________________________________________________________________________

## 2022-08-14 ENCOUNTER — Encounter (HOSPITAL_COMMUNITY)
Admission: RE | Admit: 2022-08-14 | Discharge: 2022-08-14 | Disposition: A | Discharge status: Home / Self Care | Payer: Medicare (Managed Care) | Source: Ambulatory Visit | Attending: Orthopedic Surgery | Admitting: Orthopedic Surgery

## 2022-08-14 ENCOUNTER — Other Ambulatory Visit: Payer: Self-pay

## 2022-08-14 ENCOUNTER — Encounter (HOSPITAL_COMMUNITY): Payer: Self-pay

## 2022-08-14 VITALS — BP 160/67 | HR 61 | Temp 98.2°F | Ht 62.0 in | Wt 135.0 lb

## 2022-08-14 DIAGNOSIS — I679 Cerebrovascular disease, unspecified: Secondary | ICD-10-CM | POA: Insufficient documentation

## 2022-08-14 DIAGNOSIS — Z01818 Encounter for other preprocedural examination: Secondary | ICD-10-CM | POA: Diagnosis not present

## 2022-08-14 HISTORY — DX: Prediabetes: R73.03

## 2022-08-14 LAB — BASIC METABOLIC PANEL
Anion gap: 10 (ref 5–15)
BUN: 18 mg/dL (ref 8–23)
CO2: 27 mmol/L (ref 22–32)
Calcium: 9.2 mg/dL (ref 8.9–10.3)
Chloride: 99 mmol/L (ref 98–111)
Creatinine, Ser: 0.71 mg/dL (ref 0.44–1.00)
GFR, Estimated: >60 mL/min (ref >60)
Glucose, Bld: 100 mg/dL — ABNORMAL HIGH (ref 70–99)
Potassium: 4 mmol/L (ref 3.5–5.1)
Sodium: 136 mmol/L (ref 135–145)

## 2022-08-14 LAB — CBC
HCT: 37 % (ref 36.0–46.0)
Hemoglobin: 12.4 g/dL (ref 12.0–15.0)
MCH: 31.6 pg (ref 26.0–34.0)
MCHC: 33.5 g/dL (ref 30.0–36.0)
MCV: 94.1 fL (ref 80.0–100.0)
Platelets: 191 10*3/uL (ref 150–400)
RBC: 3.93 MIL/uL (ref 3.87–5.11)
RDW: 12.7 % (ref 11.5–15.5)
WBC: 6.6 10*3/uL (ref 4.0–10.5)
nRBC: 0 % (ref 0.0–0.2)

## 2022-08-14 LAB — SURGICAL PCR SCREEN
MRSA, PCR: NEGATIVE
Staphylococcus aureus: NEGATIVE

## 2022-08-14 NOTE — Progress Notes (Addendum)
For Short Stay: COVID SWAB appointment date:  Bowel Prep reminder:   For Anesthesia: PCP - Dr. Georgann Housekeeper: Clearance: 07/25/22: Chart. Cardiologist -   Chest x-ray -  EKG -  Stress Test -  ECHO -  Cardiac Cath -  Pacemaker/ICD device last checked: Pacemaker orders received: Device Rep notified:  Spinal Cord Stimulator: N/A  Sleep Study - N/A CPAP -   Fasting Blood Sugar - N/A Checks Blood Sugar _____ times a day Date and result of last Hgb A1c-  Last dose of GLP1 agonist- N/A GLP1 instructions:   Last dose of SGLT-2 inhibitors- N/A SGLT-2 instructions:   Blood Thinner Instructions: N/A Aspirin Instructions: Last Dose:  Activity level: Can go up a flight of stairs and activities of daily living without stopping and without chest pain and/or shortness of breath   Able to exercise without chest pain and/or shortness of breath  Anesthesia review: Hx: HTN,Right BBB,CVA  Patient denies shortness of breath, fever, cough and chest pain at PAT appointment   Patient verbalized understanding of instructions that were given to them at the PAT appointment. Patient was also instructed that they will need to review over the PAT instructions again at home before surgery.

## 2022-08-24 ENCOUNTER — Ambulatory Visit (HOSPITAL_BASED_OUTPATIENT_CLINIC_OR_DEPARTMENT_OTHER): Payer: Medicare (Managed Care) | Admitting: Anesthesiology

## 2022-08-24 ENCOUNTER — Other Ambulatory Visit: Payer: Self-pay

## 2022-08-24 ENCOUNTER — Encounter (HOSPITAL_COMMUNITY): Payer: Self-pay | Admitting: Orthopedic Surgery

## 2022-08-24 ENCOUNTER — Encounter (HOSPITAL_COMMUNITY)
Admission: RE | Disposition: A | Discharge status: Home / Self Care | Payer: Self-pay | Source: Home / Self Care | Attending: Orthopedic Surgery

## 2022-08-24 ENCOUNTER — Observation Stay (HOSPITAL_COMMUNITY)
Admission: RE | Admit: 2022-08-24 | Discharge: 2022-08-25 | Disposition: A | Discharge status: Home / Self Care | Payer: Medicare (Managed Care) | Attending: Orthopedic Surgery | Admitting: Orthopedic Surgery

## 2022-08-24 ENCOUNTER — Ambulatory Visit (HOSPITAL_COMMUNITY): Payer: Medicare (Managed Care) | Admitting: Physician Assistant

## 2022-08-24 DIAGNOSIS — Z87891 Personal history of nicotine dependence: Secondary | ICD-10-CM | POA: Insufficient documentation

## 2022-08-24 DIAGNOSIS — I10 Essential (primary) hypertension: Secondary | ICD-10-CM | POA: Insufficient documentation

## 2022-08-24 DIAGNOSIS — M1712 Unilateral primary osteoarthritis, left knee: Principal | ICD-10-CM | POA: Insufficient documentation

## 2022-08-24 DIAGNOSIS — Z96651 Presence of right artificial knee joint: Secondary | ICD-10-CM | POA: Diagnosis not present

## 2022-08-24 DIAGNOSIS — I679 Cerebrovascular disease, unspecified: Secondary | ICD-10-CM | POA: Diagnosis not present

## 2022-08-24 DIAGNOSIS — G8918 Other acute postprocedural pain: Secondary | ICD-10-CM | POA: Diagnosis not present

## 2022-08-24 DIAGNOSIS — Z96652 Presence of left artificial knee joint: Principal | ICD-10-CM

## 2022-08-24 DIAGNOSIS — Z79899 Other long term (current) drug therapy: Secondary | ICD-10-CM | POA: Insufficient documentation

## 2022-08-24 DIAGNOSIS — Z8673 Personal history of transient ischemic attack (TIA), and cerebral infarction without residual deficits: Secondary | ICD-10-CM | POA: Insufficient documentation

## 2022-08-24 HISTORY — PX: TOTAL KNEE ARTHROPLASTY: SHX125

## 2022-08-24 SURGERY — ARTHROPLASTY, KNEE, TOTAL
Anesthesia: Spinal | Site: Knee | Laterality: Left

## 2022-08-24 MED ORDER — ACETAMINOPHEN 500 MG PO TABS
1000.0000 mg | ORAL_TABLET | Freq: Once | ORAL | Status: AC
  Administered 2022-08-24: 1000 mg via ORAL
  Filled 2022-08-24: qty 2

## 2022-08-24 MED ORDER — METHOCARBAMOL 500 MG PO TABS
500.0000 mg | ORAL_TABLET | Freq: Four times a day (QID) | ORAL | Status: DC | PRN
  Administered 2022-08-25: 500 mg via ORAL
  Filled 2022-08-24 (×2): qty 1

## 2022-08-24 MED ORDER — ESCITALOPRAM OXALATE 10 MG PO TABS
10.0000 mg | ORAL_TABLET | Freq: Every day | ORAL | Status: DC
  Administered 2022-08-25: 10 mg via ORAL
  Filled 2022-08-24: qty 1

## 2022-08-24 MED ORDER — FENTANYL CITRATE PF 50 MCG/ML IJ SOSY: 25.0000 ug | PREFILLED_SYRINGE | INTRAMUSCULAR | Status: DC | PRN

## 2022-08-24 MED ORDER — HYDRALAZINE HCL 20 MG/ML IJ SOLN
10.0000 mg | Freq: Once | INTRAMUSCULAR | Status: AC
  Administered 2022-08-24: 10 mg via INTRAVENOUS

## 2022-08-24 MED ORDER — CEFAZOLIN SODIUM-DEXTROSE 2-4 GM/100ML-% IV SOLN
2.0000 g | Freq: Four times a day (QID) | INTRAVENOUS | Status: AC
  Administered 2022-08-24 (×2): 2 g via INTRAVENOUS
  Filled 2022-08-24 (×2): qty 100

## 2022-08-24 MED ORDER — SODIUM CHLORIDE (PF) 0.9 % IJ SOLN
INTRAMUSCULAR | Status: AC
  Filled 2022-08-24: qty 50

## 2022-08-24 MED ORDER — 0.9 % SODIUM CHLORIDE (POUR BTL) OPTIME
TOPICAL | Status: DC | PRN
  Administered 2022-08-24: 1000 mL

## 2022-08-24 MED ORDER — PROPOFOL 1000 MG/100ML IV EMUL
INTRAVENOUS | Status: AC
  Filled 2022-08-24: qty 100

## 2022-08-24 MED ORDER — PHENYLEPHRINE HCL-NACL 20-0.9 MG/250ML-% IV SOLN
INTRAVENOUS | Status: DC | PRN
  Administered 2022-08-24: 10 ug/min via INTRAVENOUS

## 2022-08-24 MED ORDER — DEXAMETHASONE SODIUM PHOSPHATE 10 MG/ML IJ SOLN
10.0000 mg | Freq: Once | INTRAMUSCULAR | Status: AC
  Administered 2022-08-25: 10 mg via INTRAVENOUS
  Filled 2022-08-24: qty 1

## 2022-08-24 MED ORDER — PHENYLEPHRINE 80 MCG/ML (10ML) SYRINGE FOR IV PUSH (FOR BLOOD PRESSURE SUPPORT)
PREFILLED_SYRINGE | INTRAVENOUS | Status: AC
  Filled 2022-08-24: qty 10

## 2022-08-24 MED ORDER — METOCLOPRAMIDE HCL 5 MG/ML IJ SOLN: 5.0000 mg | Freq: Three times a day (TID) | INTRAMUSCULAR | Status: DC | PRN

## 2022-08-24 MED ORDER — METOCLOPRAMIDE HCL 5 MG PO TABS: 5.0000 mg | ORAL_TABLET | Freq: Three times a day (TID) | ORAL | Status: DC | PRN

## 2022-08-24 MED ORDER — BUPIVACAINE-EPINEPHRINE (PF) 0.25% -1:200000 IJ SOLN
INTRAMUSCULAR | Status: DC | PRN
  Administered 2022-08-24: 30 mL

## 2022-08-24 MED ORDER — PROPOFOL 10 MG/ML IV BOLUS
INTRAVENOUS | Status: DC | PRN
Start: 2022-08-24 — End: 2022-08-24
  Administered 2022-08-24: 30 mg via INTRAVENOUS

## 2022-08-24 MED ORDER — PHENOL 1.4 % MT LIQD: 1.0000 | OROMUCOSAL | Status: DC | PRN

## 2022-08-24 MED ORDER — SODIUM CHLORIDE 0.9 % IR SOLN
Status: DC | PRN
  Administered 2022-08-24: 1000 mL

## 2022-08-24 MED ORDER — FENTANYL CITRATE PF 50 MCG/ML IJ SOSY
100.0000 ug | PREFILLED_SYRINGE | Freq: Once | INTRAMUSCULAR | Status: AC
  Administered 2022-08-24: 50 ug via INTRAVENOUS
  Filled 2022-08-24: qty 2

## 2022-08-24 MED ORDER — SODIUM CHLORIDE 0.9 % IV SOLN: INTRAVENOUS | Status: DC

## 2022-08-24 MED ORDER — DIPHENHYDRAMINE HCL 12.5 MG/5ML PO ELIX: 12.5000 mg | ORAL_SOLUTION | ORAL | Status: DC | PRN

## 2022-08-24 MED ORDER — ROPIVACAINE HCL 5 MG/ML IJ SOLN
INTRAMUSCULAR | Status: DC | PRN
  Administered 2022-08-24: 20 mL via PERINEURAL

## 2022-08-24 MED ORDER — PRAVASTATIN SODIUM 20 MG PO TABS
20.0000 mg | ORAL_TABLET | Freq: Every day | ORAL | Status: DC
  Filled 2022-08-24: qty 1

## 2022-08-24 MED ORDER — DOCUSATE SODIUM 100 MG PO CAPS
100.0000 mg | ORAL_CAPSULE | Freq: Two times a day (BID) | ORAL | Status: DC
  Administered 2022-08-24 – 2022-08-25 (×2): 100 mg via ORAL
  Filled 2022-08-24 (×2): qty 1

## 2022-08-24 MED ORDER — BISACODYL 10 MG RE SUPP: 10.0000 mg | Freq: Every day | RECTAL | Status: DC | PRN

## 2022-08-24 MED ORDER — BUPIVACAINE HCL (PF) 0.25 % IJ SOLN
INTRAMUSCULAR | Status: AC
  Filled 2022-08-24: qty 30

## 2022-08-24 MED ORDER — DOXAZOSIN MESYLATE 4 MG PO TABS
4.0000 mg | ORAL_TABLET | Freq: Two times a day (BID) | ORAL | Status: DC
  Administered 2022-08-24 – 2022-08-25 (×2): 4 mg via ORAL
  Filled 2022-08-24 (×2): qty 1

## 2022-08-24 MED ORDER — CEFAZOLIN SODIUM-DEXTROSE 2-4 GM/100ML-% IV SOLN
2.0000 g | INTRAVENOUS | Status: AC
  Administered 2022-08-24: 2 g via INTRAVENOUS
  Filled 2022-08-24: qty 100

## 2022-08-24 MED ORDER — ONDANSETRON HCL 4 MG/2ML IJ SOLN
INTRAMUSCULAR | Status: DC | PRN
  Administered 2022-08-24: 4 mg via INTRAVENOUS

## 2022-08-24 MED ORDER — ACETAMINOPHEN 500 MG PO TABS
1000.0000 mg | ORAL_TABLET | Freq: Four times a day (QID) | ORAL | Status: DC
  Administered 2022-08-24 – 2022-08-25 (×4): 1000 mg via ORAL
  Filled 2022-08-24 (×3): qty 2

## 2022-08-24 MED ORDER — KETOROLAC TROMETHAMINE 30 MG/ML IJ SOLN
INTRAMUSCULAR | Status: DC | PRN
  Administered 2022-08-24: 30 mg

## 2022-08-24 MED ORDER — ASPIRIN 81 MG PO CHEW
81.0000 mg | CHEWABLE_TABLET | Freq: Two times a day (BID) | ORAL | Status: DC
  Administered 2022-08-24 – 2022-08-25 (×2): 81 mg via ORAL
  Filled 2022-08-24 (×2): qty 1

## 2022-08-24 MED ORDER — ONDANSETRON HCL 4 MG PO TABS: 4.0000 mg | ORAL_TABLET | Freq: Four times a day (QID) | ORAL | Status: DC | PRN

## 2022-08-24 MED ORDER — POVIDONE-IODINE 10 % EX SWAB: 2.0000 | Freq: Once | CUTANEOUS | Status: DC

## 2022-08-24 MED ORDER — LACTATED RINGERS IV SOLN: INTRAVENOUS | Status: DC

## 2022-08-24 MED ORDER — CLONIDINE HCL 0.1 MG PO TABS: 0.0500 mg | ORAL_TABLET | Freq: Every day | ORAL | Status: DC | PRN

## 2022-08-24 MED ORDER — TRANEXAMIC ACID-NACL 1000-0.7 MG/100ML-% IV SOLN
1000.0000 mg | Freq: Once | INTRAVENOUS | Status: AC
  Administered 2022-08-24: 1000 mg via INTRAVENOUS
  Filled 2022-08-24: qty 100

## 2022-08-24 MED ORDER — PROPOFOL 500 MG/50ML IV EMUL
INTRAVENOUS | Status: DC | PRN
  Administered 2022-08-24: 50 ug/kg/min via INTRAVENOUS

## 2022-08-24 MED ORDER — DEXAMETHASONE SODIUM PHOSPHATE 10 MG/ML IJ SOLN
INTRAMUSCULAR | Status: DC | PRN
  Administered 2022-08-24: 10 mg

## 2022-08-24 MED ORDER — ONDANSETRON HCL 4 MG/2ML IJ SOLN: 4.0000 mg | Freq: Four times a day (QID) | INTRAMUSCULAR | Status: DC | PRN

## 2022-08-24 MED ORDER — ONDANSETRON HCL 4 MG/2ML IJ SOLN: 4.0000 mg | Freq: Once | INTRAMUSCULAR | Status: DC | PRN

## 2022-08-24 MED ORDER — METHOCARBAMOL 500 MG IVPB - SIMPLE MED: 500.0000 mg | Freq: Four times a day (QID) | INTRAVENOUS | Status: DC | PRN

## 2022-08-24 MED ORDER — HYDRALAZINE HCL 20 MG/ML IJ SOLN
INTRAMUSCULAR | Status: AC
  Filled 2022-08-24: qty 1

## 2022-08-24 MED ORDER — ONDANSETRON HCL 4 MG/2ML IJ SOLN
INTRAMUSCULAR | Status: AC
  Filled 2022-08-24: qty 2

## 2022-08-24 MED ORDER — TRANEXAMIC ACID-NACL 1000-0.7 MG/100ML-% IV SOLN
1000.0000 mg | INTRAVENOUS | Status: AC
  Administered 2022-08-24: 1000 mg via INTRAVENOUS
  Filled 2022-08-24: qty 100

## 2022-08-24 MED ORDER — CHLORHEXIDINE GLUCONATE 0.12 % MT SOLN
15.0000 mL | Freq: Once | OROMUCOSAL | Status: AC
  Administered 2022-08-24: 15 mL via OROMUCOSAL

## 2022-08-24 MED ORDER — IRBESARTAN 300 MG PO TABS
300.0000 mg | ORAL_TABLET | Freq: Every day | ORAL | Status: DC
  Administered 2022-08-25: 300 mg via ORAL
  Filled 2022-08-24: qty 2
  Filled 2022-08-24 (×2): qty 1

## 2022-08-24 MED ORDER — MELOXICAM 15 MG PO TABS
15.0000 mg | ORAL_TABLET | Freq: Every day | ORAL | Status: DC
  Administered 2022-08-25: 15 mg via ORAL
  Filled 2022-08-24: qty 1

## 2022-08-24 MED ORDER — POLYETHYLENE GLYCOL 3350 17 G PO PACK
17.0000 g | PACK | Freq: Two times a day (BID) | ORAL | Status: DC
  Administered 2022-08-24 – 2022-08-25 (×2): 17 g via ORAL
  Filled 2022-08-24 (×2): qty 1

## 2022-08-24 MED ORDER — PRAVASTATIN SODIUM 20 MG PO TABS
20.0000 mg | ORAL_TABLET | Freq: Every day | ORAL | Status: DC
  Administered 2022-08-24: 20 mg via ORAL
  Filled 2022-08-24: qty 1

## 2022-08-24 MED ORDER — METOPROLOL SUCCINATE ER 25 MG PO TB24
25.0000 mg | ORAL_TABLET | Freq: Every evening | ORAL | Status: DC
  Filled 2022-08-24: qty 1

## 2022-08-24 MED ORDER — OXYCODONE HCL 5 MG PO TABS
10.0000 mg | ORAL_TABLET | ORAL | Status: DC | PRN
  Administered 2022-08-24 – 2022-08-25 (×2): 10 mg via ORAL
  Filled 2022-08-24: qty 2

## 2022-08-24 MED ORDER — STERILE WATER FOR IRRIGATION IR SOLN
Status: DC | PRN
  Administered 2022-08-24: 2000 mL

## 2022-08-24 MED ORDER — DEXAMETHASONE SODIUM PHOSPHATE 10 MG/ML IJ SOLN
INTRAMUSCULAR | Status: AC
  Filled 2022-08-24: qty 1

## 2022-08-24 MED ORDER — PHENYLEPHRINE 80 MCG/ML (10ML) SYRINGE FOR IV PUSH (FOR BLOOD PRESSURE SUPPORT)
PREFILLED_SYRINGE | INTRAVENOUS | Status: DC | PRN
  Administered 2022-08-24: 160 ug via INTRAVENOUS

## 2022-08-24 MED ORDER — MIDAZOLAM HCL 2 MG/2ML IJ SOLN: 2.0000 mg | Freq: Once | INTRAMUSCULAR | Status: DC

## 2022-08-24 MED ORDER — OXYCODONE HCL 5 MG PO TABS
5.0000 mg | ORAL_TABLET | ORAL | Status: DC | PRN
  Administered 2022-08-24: 5 mg via ORAL
  Filled 2022-08-24: qty 2
  Filled 2022-08-24 (×2): qty 1

## 2022-08-24 MED ORDER — MENTHOL 3 MG MT LOZG: 1.0000 | LOZENGE | OROMUCOSAL | Status: DC | PRN

## 2022-08-24 MED ORDER — SODIUM CHLORIDE (PF) 0.9 % IJ SOLN
INTRAMUSCULAR | Status: DC | PRN
  Administered 2022-08-24: 30 mL via INTRAVENOUS

## 2022-08-24 MED ORDER — METOPROLOL SUCCINATE ER 25 MG PO TB24
25.0000 mg | ORAL_TABLET | ORAL | Status: DC
  Administered 2022-08-24: 25 mg via ORAL
  Filled 2022-08-24: qty 1

## 2022-08-24 MED ORDER — ORAL CARE MOUTH RINSE: 15.0000 mL | Freq: Once | OROMUCOSAL | Status: AC

## 2022-08-24 MED ORDER — HYDROMORPHONE HCL 1 MG/ML IJ SOLN
0.5000 mg | INTRAMUSCULAR | Status: DC | PRN
  Administered 2022-08-24: 1 mg via INTRAVENOUS
  Filled 2022-08-24: qty 1

## 2022-08-24 MED ORDER — KETOROLAC TROMETHAMINE 30 MG/ML IJ SOLN
INTRAMUSCULAR | Status: AC
  Filled 2022-08-24: qty 1

## 2022-08-24 MED ORDER — DEXAMETHASONE SODIUM PHOSPHATE 10 MG/ML IJ SOLN
8.0000 mg | Freq: Once | INTRAMUSCULAR | Status: AC
  Administered 2022-08-24: 8 mg via INTRAVENOUS

## 2022-08-24 SURGICAL SUPPLY — 57 items
ADH SKN CLS APL DERMABOND .7 (GAUZE/BANDAGES/DRESSINGS) ×2
ATTUNE MED ANAT PAT 32 KNEE (Knees) IMPLANT
BAG COUNTER SPONGE SURGICOUNT (BAG) IMPLANT
BAG SPEC THK2 15X12 ZIP CLS (MISCELLANEOUS)
BAG SPNG CNTER NS LX DISP (BAG)
BAG ZIPLOCK 12X15 (MISCELLANEOUS) IMPLANT
BASEPLATE TIB CMT FB PCKT SZ3 (Knees) IMPLANT
BLADE SAW SGTL 11.0X1.19X90.0M (BLADE) IMPLANT
BLADE SAW SGTL 13.0X1.19X90.0M (BLADE) ×2 IMPLANT
BNDG CMPR 6 X 5 YARDS HK CLSR (GAUZE/BANDAGES/DRESSINGS) ×1
BNDG CMPR 6"X 5 YARDS HK CLSR (GAUZE/BANDAGES/DRESSINGS) ×1
BNDG ELASTIC 6INX 5YD STR LF (GAUZE/BANDAGES/DRESSINGS) ×2 IMPLANT
BOWL SMART MIX CTS (DISPOSABLE) ×2 IMPLANT
BSPLAT TIB 3 CMNT FXBRNG STRL (Knees) ×1 IMPLANT
CEMENT HV SMART SET (Cement) IMPLANT
COMP FEM CMT ATTUNE NRS 4 LT (Joint) ×1 IMPLANT
COMPONENT FEM CMT ATTN NRS 4LT (Joint) IMPLANT
COVER SURGICAL LIGHT HANDLE (MISCELLANEOUS) ×2 IMPLANT
CUFF TOURN SGL QUICK 34 (TOURNIQUET CUFF) ×1
CUFF TRNQT CYL 34X4.125X (TOURNIQUET CUFF) ×2 IMPLANT
DERMABOND ADVANCED .7 DNX12 (GAUZE/BANDAGES/DRESSINGS) ×2 IMPLANT
DRAPE U-SHAPE 47X51 STRL (DRAPES) ×2 IMPLANT
DRESSING AQUACEL AG SP 3.5X10 (GAUZE/BANDAGES/DRESSINGS) ×2 IMPLANT
DRSG AQUACEL AG ADV 3.5X10 (GAUZE/BANDAGES/DRESSINGS) IMPLANT
DRSG AQUACEL AG SP 3.5X10 (GAUZE/BANDAGES/DRESSINGS) ×1
DURAPREP 26ML APPLICATOR (WOUND CARE) ×4 IMPLANT
ELECT REM PT RETURN 15FT ADLT (MISCELLANEOUS) ×2 IMPLANT
GLOVE BIO SURGEON STRL SZ 6 (GLOVE) ×2 IMPLANT
GLOVE BIOGEL PI IND STRL 6.5 (GLOVE) ×2 IMPLANT
GLOVE BIOGEL PI IND STRL 7.5 (GLOVE) ×2 IMPLANT
GLOVE ORTHO TXT STRL SZ7.5 (GLOVE) ×4 IMPLANT
GOWN STRL REUS W/ TWL LRG LVL3 (GOWN DISPOSABLE) ×4 IMPLANT
GOWN STRL REUS W/TWL LRG LVL3 (GOWN DISPOSABLE) ×2
HANDPIECE INTERPULSE COAX TIP (DISPOSABLE) ×1
HOLDER FOLEY CATH W/STRAP (MISCELLANEOUS) IMPLANT
KIT TURNOVER KIT A (KITS) IMPLANT
LINER TIB ATTUNE FIX 4 8 LT (Liner) IMPLANT
MANIFOLD NEPTUNE II (INSTRUMENTS) ×2 IMPLANT
NDL SAFETY ECLIP 18X1.5 (MISCELLANEOUS) IMPLANT
NS IRRIG 1000ML POUR BTL (IV SOLUTION) ×2 IMPLANT
PACK TOTAL KNEE CUSTOM (KITS) ×2 IMPLANT
PIN FIX SIGMA LCS THRD HI (PIN) IMPLANT
PROTECTOR NERVE ULNAR (MISCELLANEOUS) ×2 IMPLANT
SET HNDPC FAN SPRY TIP SCT (DISPOSABLE) ×2 IMPLANT
SET PAD KNEE POSITIONER (MISCELLANEOUS) ×2 IMPLANT
SPIKE FLUID TRANSFER (MISCELLANEOUS) ×4 IMPLANT
SUT MNCRL AB 4-0 PS2 18 (SUTURE) ×2 IMPLANT
SUT STRATAFIX PDS+ 0 24IN (SUTURE) ×2 IMPLANT
SUT VIC AB 1 CT1 36 (SUTURE) ×2 IMPLANT
SUT VIC AB 2-0 CT1 27 (SUTURE) ×2
SUT VIC AB 2-0 CT1 TAPERPNT 27 (SUTURE) ×4 IMPLANT
SYR 3ML LL SCALE MARK (SYRINGE) ×2 IMPLANT
TOWEL GREEN STERILE FF (TOWEL DISPOSABLE) ×2 IMPLANT
TRAY FOLEY MTR SLVR 16FR STAT (SET/KITS/TRAYS/PACK) ×2 IMPLANT
TUBE SUCTION HIGH CAP CLEAR NV (SUCTIONS) ×2 IMPLANT
WATER STERILE IRR 1000ML POUR (IV SOLUTION) ×4 IMPLANT
WRAP KNEE MAXI GEL POST OP (GAUZE/BANDAGES/DRESSINGS) ×2 IMPLANT

## 2022-08-24 NOTE — Discharge Instructions (Signed)

## 2022-08-24 NOTE — H&P (Signed)
TOTAL KNEE ADMISSION H&P  Patient is being admitted for left total knee arthroplasty.  Therapy Plans: outpatient therapy at [facility] Disposition: Home with daughter (staying with her) Planned DVT Prophylaxis: aspirin 81mg  BID DME needed: none (has walker) PCP: Dr. Donette Larry, clearance received TXA: IV Allergies: sulfa - , hydrocodone - N/V, amlodipine - swelling Anesthesia Concerns: nausea/vomiting BMI: 25.9 Last HgbA1c: Not diabetic   Other: - NO lasix while in the hospital - oxycodone, robaxin, tylenol, meloxicam - No hx of VTE or cancer - hx of right TKA - did well  Subjective:  Chief Complaint:left knee pain.  HPI: Sarah Castro, 82 y.o. female, has a history of pain and functional disability in the left knee due to arthritis and has failed non-surgical conservative treatments for greater than 12 weeks to includeNSAID's and/or analgesics, corticosteriod injections, and activity modification.  Onset of symptoms was gradual, starting 2 years ago with gradually worsening course since that time. The patient noted no past surgery on the left knee(s).  Patient currently rates pain in the left knee(s) at 8 out of 10 with activity. Patient has worsening of pain with activity and weight bearing and pain that interferes with activities of daily living.  Patient has evidence of joint space narrowing by imaging studies. There is no active infection.  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   Displaced comminuted supracondylar fracture without  intercondylar fracture of right humerus, initial encounter for closed fracture 01/10/2018   Spinal stenosis 06/12/2016   Neurogenic claudication 06/12/2016   Lumbar radiculopathy 12/08/2015   Degenerative arthritis of left knee 10/27/2015   Piriformis syndrome of right side 01/11/2015   Greater trochanteric bursitis of right hip 11/17/2014   Toenail fungus 08/04/2014   Pes planus of both feet 08/22/2013   Primary localized osteoarthrosis, lower leg 07/25/2013   Expected blood loss anemia 03/19/2012   S/P right TKA 03/18/2012   Past Medical History:  Diagnosis Date   Arthritis    Depression with anxiety    Hypercholesteremia    Hypertension    Macular degeneration    PONV (postoperative nausea and vomiting)    Pre-diabetes    Stroke [facility])     Past Surgical History:  Procedure Laterality Date   APPENDECTOMY     broken back     BUNIONECTOMY  2013   CATARACT EXTRACTION     right   HAMMER TOE SURGERY  2013   HEMATOMA EVACUATION  5 years ago   IR RADIOLOGIST EVAL & MGMT  12/04/2019   KNEE ARTHROSCOPY     right   LAPAROSCOPIC APPENDECTOMY N/A 10/24/2012   Procedure: APPENDECTOMY LAPAROSCOPIC;  Surgeon: Emelia Loron, MD;  Location: [facility] OR;  Service: General;  Laterality: N/A;   LAPAROSCOPIC APPENDECTOMY     POSTERIOR TIBIAL TENDON REPAIR  1998   REPLACEMENT TOTAL KNEE Right    TONSILLECTOMY   age 58   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   Procedure: TOTAL KNEE ARTHROPLASTY;  Surgeon: [name], MD;  Location: [facility] ORS;  Service: Orthopedics;  Laterality: Right;    No current facility-administered  medications for this encounter.   Current Outpatient Medications  Medication Sig Dispense Refill Last Dose   calcium carbonate (TUMS - DOSED IN MG ELEMENTAL CALCIUM) 500 MG chewable tablet Chew 1,000 mg by mouth daily as needed for indigestion or heartburn.      Calcium Carbonate-Vitamin D (CALCIUM 600 + D PO) Take 1 tablet by mouth 2 (two) times daily.       cholecalciferol (VITAMIN D) 400 UNITS TABS Take 400 Units by mouth 2 (two) times daily.      cloNIDine (CATAPRES) 0.1 MG tablet Take 0.05-0.1 mg by mouth daily as needed (SBP >180 for 30 days).      doxazosin (CARDURA) 8 MG tablet Take 4 mg by mouth 2 (two) times daily.      escitalopram (LEXAPRO) 10 MG tablet Take 10 mg by mouth daily.      fish oil-omega-3 fatty acids 1000 MG capsule Take 1 g by mouth 2 (two) times daily.      furosemide (LASIX) 40 MG tablet Take 40 mg by mouth daily.      ibuprofen (ADVIL) 200 MG tablet Take 600 mg by mouth daily as needed for moderate pain.      lovastatin (MEVACOR) 40 MG tablet Take 20 mg by mouth at bedtime.      metoprolol succinate (TOPROL-XL) 25 MG 24 hr tablet Take 25 mg by mouth every evening.      Multiple Vitamins-Minerals (MULTIVITAMIN WITH MINERALS) tablet Take 1 tablet by mouth daily.      Multiple Vitamins-Minerals (PRESERVISION AREDS 2) CAPS Take 1 capsule by mouth 2 (two) times daily.      olmesartan (BENICAR) 40 MG tablet Take 40 mg by mouth daily.      potassium chloride (KLOR-CON M) 10 MEQ tablet Take 10 mEq by mouth daily.      Red Yeast Rice Extract 600 MG CAPS Take 600 mg by mouth 2 (two) times daily.      TURMERIC PO Take 400 mg by mouth 2 (two) times daily.      AMBULATORY NON FORMULARY MEDICATION Rolling walker 1 Units 0    Allergies  Allergen Reactions   Amlodipine Swelling   Garlic Diarrhea   Hydrocodone Nausea And Vomiting   Sulfa Antibiotics Rash    Social History   Tobacco Use   Smoking status: Former    Current packs/day: 0.00    Average packs/day: 0.5 packs/day for 20.0 years (10.0 ttl pk-yrs)    Types: Cigarettes    Start date: 02/07/1991    Quit date: 02/07/2011    Years since quitting: 11.5   Smokeless tobacco: Never  Substance Use Topics   Alcohol use: Yes    Comment: 2-3 glass of wine or vodka per day    Family History  Problem Relation Age of Onset   Hypertension Mother    Hypertension Father    Cancer  Father        throat   Throat cancer Father    Cancer Sister        non hodgkins lymphoma   Non-Hodgkin's lymphoma Sister    Non-Hodgkin's lymphoma Brother    Colon cancer Maternal Grandmother 44   Prostate cancer Maternal Uncle      Review of Systems  Constitutional:  Negative for chills and fever.  Respiratory:  Negative for cough and shortness of breath.   Cardiovascular:  Negative for chest pain.  Gastrointestinal:  Negative for nausea and vomiting.  Musculoskeletal:  Positive for arthralgias.  Objective:  Physical Exam Well nourished and well developed. General: Alert and oriented x3, cooperative and pleasant, no acute distress. Head: normocephalic, atraumatic, neck supple. Eyes: EOMI.  Musculoskeletal: Left knee exam: No palpable effusion, warmth erythema Slight flexion contracture associated flexion close to 120 degrees More tenderness laterally than medially Stable medial and lateral collateral ligaments Crepitation noted with range of motion   Calves soft and nontender. Motor function intact in LE. Strength 5/5 LE bilaterally. Neuro: Distal pulses 2+. Sensation to light touch intact in LE.  Vital signs in last 24 hours:    Labs:   Estimated body mass index is 24.69 kg/m as calculated from the following:   Height as of 08/14/22: 5\' 2"  (1.575 m).   Weight as of 08/14/22: 61.2 kg.   Imaging Review Plain radiographs demonstrate severe degenerative joint disease of the left knee(s). The overall alignment isneutral. The bone quality appears to be adequate for age and reported activity level.      Assessment/Plan:  End stage arthritis, left knee   The patient history, physical examination, clinical judgment of the provider and imaging studies are consistent with end stage degenerative joint disease of the left knee(s) and total knee arthroplasty is deemed medically necessary. The treatment options including medical management, injection therapy  arthroscopy and arthroplasty were discussed at length. The risks and benefits of total knee arthroplasty were presented and reviewed. The risks due to aseptic loosening, infection, stiffness, patella tracking problems, thromboembolic complications and other imponderables were discussed. The patient acknowledged the explanation, agreed to proceed with the plan and consent was signed. Patient is being admitted for inpatient treatment for surgery, pain control, PT, OT, prophylactic antibiotics, VTE prophylaxis, progressive ambulation and ADL's and discharge planning. The patient is planning to be discharged  home.     Patient's anticipated LOS is less than 2 midnights, meeting these requirements: - Younger than 62 - Lives within 1 hour of care - Has a competent adult at home to recover with post-op recover - NO history of  - Chronic pain requiring opiods  - Diabetes  - Coronary Artery Disease  - Heart failure  - Heart attack  - Stroke  - DVT/VTE  - Cardiac arrhythmia  - Respiratory Failure/COPD  - Renal failure  - Anemia  - Advanced Liver disease  Rosalene Billings, PA-C Orthopedic Surgery [facility] [place] [phone]

## 2022-08-24 NOTE — Plan of Care (Signed)

## 2022-08-24 NOTE — Anesthesia Procedure Notes (Signed)
Anesthesia Regional Block: Adductor canal block   Pre-Anesthetic Checklist: , timeout performed,  Correct Patient, Correct Site, Correct Laterality,  Correct Procedure, Correct Position, site marked,  Risks and benefits discussed,  Surgical consent,  Pre-op evaluation,  At surgeon's request and post-op pain management  Laterality: Left  Prep: chloraprep       Needles:  Injection technique: Single-shot  Needle Type: Echogenic Needle     Needle Length: 9cm  Needle Gauge: 21     Additional Needles:   Procedures:,,,, ultrasound used (permanent image in chart),,    Narrative:  Start time: 08/24/2022 10:41 AM End time: 08/24/2022 10:48 AM Injection made incrementally with aspirations every 5 mL.  Performed by: Personally  Anesthesiologist: Collene Schlichter, MD  Additional Notes: No pain on injection. No increased resistance to injection. Injection made in 5cc increments.  Good needle visualization.  Patient tolerated procedure well.

## 2022-08-24 NOTE — Op Note (Signed)
NAME:  Sarah Castro                      MEDICAL RECORD NO.:  [id number]                             FACILITY:  [facility]      PHYSICIAN:  Madlyn Frankel. Charlann Boxer, M.D.  DATE OF BIRTH:  08-01-1940      DATE OF PROCEDURE:  08/24/2022                                     OPERATIVE REPORT         PREOPERATIVE DIAGNOSIS:  Left knee osteoarthritis.      POSTOPERATIVE DIAGNOSIS:  Left knee osteoarthritis.      FINDINGS:  The patient was noted to have complete loss of cartilage and   bone-on-bone arthritis with associated osteophytes in the lateral and patellofemoral compartments of   the knee with a significant synovitis and associated effusion.  The patient had failed months of conservative treatment including medications, injection therapy, activity modification.     PROCEDURE:  Left total knee replacement.      COMPONENTS USED:  DePuy Attune FB CR MS knee   system, a size 4N femur, 3 tibia, size 8 mm CR MS AOX insert, and 32 anatomic patellar   button.      SURGEON:  Madlyn Frankel. Charlann Boxer, M.D.      ASSISTANT:  Rosalene Billings, PA-C.      ANESTHESIA:  Regional and Spinal.      SPECIMENS:  None.      COMPLICATION:  None.      DRAINS:  None.  EBL: <100 cc      TOURNIQUET TIME:  31 min at 225 mmHg     The patient was stable to the recovery room.      INDICATION FOR PROCEDURE:  Sarah Castro is a 82 y.o. female patient of   mine.  The patient had been seen, evaluated, and treated for months conservatively in the   office with medication, activity modification, and injections.  The patient had   radiographic changes of bone-on-bone arthritis with endplate sclerosis and osteophytes noted.  Based on the radiographic changes and failed conservative measures, the patient   decided to proceed with definitive treatment, total knee replacement.  Risks of infection, DVT, component failure, need for revision surgery, neurovascular injury were reviewed in the office setting.  The postop course  was reviewed stressing the efforts to maximize post-operative satisfaction and function.  Consent was obtained for benefit of pain   relief.      PROCEDURE IN DETAIL:  The patient was brought to the operative theater.   Once adequate anesthesia, preoperative antibiotics, 2 gm of Ancef,1 gm of Tranexamic Acid, and 10 mg of Decadron administered, the patient was positioned supine with a left thigh tourniquet placed.  The  left lower extremity was prepped and draped in sterile fashion.  A time-   out was performed identifying the patient, planned procedure, and the appropriate extremity.      The left lower extremity was placed in the [facility] leg holder.  The leg was   exsanguinated, tourniquet elevated to 225 mmHg.  A midline incision was   made followed by median parapatellar arthrotomy.  Following initial   exposure, attention was  first directed to the patella.  Precut   measurement was noted to be 20 mm.  I resected down to 13 mm and used a   32 anatomic patellar button to restore patellar height as well as cover the cut surface.      The lug holes were drilled and a metal shim was placed to protect the   patella from retractors and saw blade during the procedure.      At this point, attention was now directed to the femur.  The femoral   canal was opened with a drill, irrigated to try to prevent fat emboli.  An   intramedullary rod was passed at 3 degrees valgus, 9 mm of bone was   resected off the distal femur.  Following this resection, the tibia was   subluxated anteriorly.  Using the extramedullary guide, 4 mm of bone was resected off   the proximal lateral tibia.  We confirmed the gap would be   stable medially and laterally with a size 6 spacer block as well as confirmed that the tibial cut was perpendicular in the coronal plane, checking with an alignment rod.      Once this was done, I sized the femur to be a size 4 in the anterior-   posterior dimension, chose a narrow component  based on medial and   lateral dimension.  The size 4 rotation block was then pinned in   position anterior referenced using the C-clamp to set rotation.  The   anterior, posterior, and  chamfer cuts were made without difficulty nor   notching making certain that I was along the anterior cortex to help   with flexion gap stability.      The final shim cut was made off the lateral aspect of distal femur.      At this point, the tibia was sized to be a size 3.  The size 3 tray was   then pinned in position through the medial third of the tubercle,   drilled, and keel punched.  Trial reduction was now carried with a 4 femur,  3 tibia, a size 8 mm CR MS insert, and the 32 anatomic patella botton.  The knee was brought to full extension with good flexion stability with the patella   tracking through the trochlea without application of pressure.  Given   all these findings the trial components removed.  Final components were   opened and cement was mixed.  The knee was irrigated with normal saline solution and pulse lavage.  The synovial lining was   then injected with 30 cc of 0.25% Marcaine with epinephrine, 1 cc of Toradol and 30 cc of NS for a total of 61 cc.     Final implants were then cemented onto cleaned and dried cut surfaces of bone with the knee brought to extension with a size 8 mm CR MS trial insert.      Once the cement had fully cured, excess cement was removed   throughout the knee.  I confirmed that I was satisfied with the range of   motion and stability, and the final size 8 mm CR MS AOX insert was chosen.  It was   placed into the knee.      The tourniquet had been let down at 31 minutes.  No significant   hemostasis was required.  The extensor mechanism was then reapproximated using #1 Vicryl and #1 Stratafix sutures with the knee   in flexion.  The   remaining wound was closed with 2-0 Vicryl and running 4-0 Monocryl.   The knee was cleaned, dried, dressed sterilely using  Dermabond and   Aquacel dressing.  The patient was then   brought to recovery room in stable condition, tolerating the procedure   well.   Please note that Physician Assistant, Rosalene Billings, PA-C was present for the entirety of the case, and was utilized for pre-operative positioning, peri-operative retractor management, general facilitation of the procedure and for primary wound closure at the end of the case.              Madlyn Frankel Charlann Boxer, M.D.    08/24/2022 11:21 AM

## 2022-08-24 NOTE — Transfer of Care (Signed)
Immediate Anesthesia Transfer of Care Note  Patient: [name]  Procedure(s) Performed: TOTAL KNEE ARTHROPLASTY (Left: Knee)  Patient Location: PACU  Anesthesia Type:Spinal  Level of Consciousness: awake and patient cooperative  Airway & Oxygen Therapy: Patient Spontanous Breathing and Patient connected to face mask  Post-op Assessment: Report given to RN and Post -op Vital signs reviewed and stable  Post vital signs: Reviewed and stable  Last Vitals:  Vitals Value Taken Time  BP 127/48 08/24/22 1255  Temp    Pulse 59 08/24/22 1256  Resp 18 08/24/22 1256  SpO2 100 % 08/24/22 1256  Vitals shown include unfiled device data.  Last Pain:  Vitals:   08/24/22 1050  TempSrc:   PainSc: 0-No pain         Complications: No notable events documented.

## 2022-08-24 NOTE — Interval H&P Note (Signed)
History and Physical Interval Note:  08/24/2022 9:53 AM  Sarah Castro  has presented today for surgery, with the diagnosis of Left knee osteoarthritis.  The various methods of treatment have been discussed with the patient and family. After consideration of risks, benefits and other options for treatment, the patient has consented to  Procedure(s): TOTAL KNEE ARTHROPLASTY (Left) as a surgical intervention.  The patient's history has been reviewed, patient examined, no change in status, stable for surgery.  I have reviewed the patient's chart and labs.  Questions were answered to the patient's satisfaction.     [name]

## 2022-08-24 NOTE — Anesthesia Postprocedure Evaluation (Signed)
Anesthesia Post Note  Patient: Sarah Castro  Procedure(s) Performed: TOTAL KNEE ARTHROPLASTY (Left: Knee)     Patient location during evaluation: PACU Anesthesia Type: Spinal Level of consciousness: awake, awake and alert and oriented Pain management: pain level controlled Vital Signs Assessment: post-procedure vital signs reviewed and stable Respiratory status: spontaneous breathing, nonlabored ventilation and respiratory function stable Cardiovascular status: blood pressure returned to baseline and stable Postop Assessment: no headache, no backache, spinal receding and no apparent nausea or vomiting Anesthetic complications: no   No notable events documented.  Last Vitals:  Vitals:   08/24/22 1507 08/24/22 1658  BP: (!) 151/58 (!) 160/70  Pulse: (!) 56 63  Resp: 18 16  Temp: (!) 36.4 C 36.4 C  SpO2: 93% 96%    Last Pain:  Vitals:   08/24/22 1711  TempSrc:   PainSc: 8                  Collene Schlichter

## 2022-08-24 NOTE — Anesthesia Preprocedure Evaluation (Addendum)
Anesthesia Evaluation  Patient identified by MRN, date of birth, ID band Patient awake    Reviewed: Allergy & Precautions, NPO status , Patient's Chart, lab work & pertinent test results, reviewed documented beta blocker date and time   History of Anesthesia Complications (+) PONV and history of anesthetic complications  Airway Mallampati: II  TM Distance: >3 FB Neck ROM: Full    Dental  (+) Dental Advisory Given, Partial Upper, Missing   Pulmonary former smoker   Pulmonary exam normal breath sounds clear to auscultation       Cardiovascular hypertension, Pt. on medications and Pt. on home beta blockers Normal cardiovascular exam Rhythm:Regular Rate:Normal     Neuro/Psych  PSYCHIATRIC DISORDERS Anxiety Depression     Neuromuscular disease CVA    GI/Hepatic negative GI ROS, Neg liver ROS,,,  Endo/Other  negative endocrine ROS    Renal/GU negative Renal ROS     Musculoskeletal  (+) Arthritis  (Left knee osteoarthritis), Osteoarthritis,    Abdominal   Peds  Hematology negative hematology ROS (+)   Anesthesia Other Findings Day of surgery medications reviewed with the patient.  Reproductive/Obstetrics                             Anesthesia Physical Anesthesia Plan  ASA: 3  Anesthesia Plan: Spinal   Post-op Pain Management: Regional block* and Tylenol PO (pre-op)*   Induction: Intravenous  PONV Risk Score and Plan: 3 and TIVA, Dexamethasone and Ondansetron  Airway Management Planned: Natural Airway and Simple Face Mask  Additional Equipment:   Intra-op Plan:   Post-operative Plan:   Informed Consent: I have reviewed the patients History and Physical, chart, labs and discussed the procedure including the risks, benefits and alternatives for the proposed anesthesia with the patient or authorized representative who has indicated his/her understanding and acceptance.     Dental  advisory given  Plan Discussed with: CRNA, Anesthesiologist and Surgeon  Anesthesia Plan Comments: (Discussed risks and benefits of and differences between spinal and general. Discussed risks of spinal including headache, backache, failure, bleeding, infection, and nerve damage. Patient consents to spinal. Questions answered. Coagulation studies and platelet count acceptable.)       Anesthesia Quick Evaluation

## 2022-08-24 NOTE — Anesthesia Procedure Notes (Signed)
Procedure Name: MAC Date/Time: 08/24/2022 11:20 AM  Performed by: Vanessa Cumming, CRNAPre-anesthesia Checklist: Patient identified, Emergency Drugs available, Suction available and Patient being monitored Patient Re-evaluated:Patient Re-evaluated prior to induction Oxygen Delivery Method: Simple face mask

## 2022-08-24 NOTE — Evaluation (Signed)
Physical Therapy Evaluation Patient Details Name: Sarah Castro MRN: [id number] DOB: [birth year] Today's Date: 08/24/2022  History of Present Illness  Pt s/p L TKR and with hx of R TKR, CVA and macular degeneration  Clinical Impression  Pt s/p L TKR and presents with decreased L LE strength/ROM and post op pain limiting functional mobility. Pt should progress to dc home with family assist and reports first OP PT scheduled for 08/28/22.      Assistance Recommended at Discharge Intermittent Supervision/Assistance  If plan is discharge home, recommend the following:  Can travel by private vehicle  A little help with walking and/or transfers;A little help with bathing/dressing/bathroom;Assistance with cooking/housework;Assist for transportation;Help with stairs or ramp for entrance        Equipment Recommendations    Recommendations for Other Services       Functional Status Assessment Patient has had a recent decline in their functional status and demonstrates the ability to make significant improvements in function in a reasonable and predictable amount of time.     Precautions / Restrictions Precautions Precautions: Knee;Fall Restrictions Weight Bearing Restrictions: No Other Position/Activity Restrictions: WBAT      Mobility  Bed Mobility Overal bed mobility: Needs Assistance Bed Mobility: Supine to Sit     Supine to sit: Min assist     General bed mobility comments: cues for sequence and use of R LE to self assist    Transfers Overall transfer level: Needs assistance Equipment used: Rolling walker (2 wheels) Transfers: Sit to/from Stand Sit to Stand: Min assist           General transfer comment: cues for LE management and use of UEs to self assist    Ambulation/Gait Ambulation/Gait assistance: Min assist Gait Distance (Feet): 16 Feet Assistive device: Rolling walker (2 wheels) Gait Pattern/deviations: Step-to pattern, Decreased step length -  right, Decreased step length - left, Shuffle, Trunk flexed Gait velocity: decr     General Gait Details: cues for sequence, posture and position from [employer]            Wheelchair Mobility     Tilt Bed    Modified Rankin (Stroke Patients Only)       Balance Overall balance assessment: Needs assistance Sitting-balance support: No upper extremity supported, Feet supported Sitting balance-Leahy Scale: Fair     Standing balance support: Bilateral upper extremity supported Standing balance-Leahy Scale: Poor                               Pertinent Vitals/Pain Pain Assessment Pain Assessment: 0-10 Pain Score: 5  Pain Location: L knee Pain Descriptors / Indicators: Aching, Sore Pain Intervention(s): Limited activity within patient's tolerance, Monitored during session, Ice applied, Patient requesting pain meds-RN notified, RN gave pain meds during session    Home Living Family/patient expects to be discharged to:: Private residence Living Arrangements: Alone Available Help at Discharge: Family;Available 24 hours/day Type of Home: House Home Access: Stairs to enter   [employer] of Steps: 1   Home Layout: One level Home Equipment: Agricultural consultant (2 wheels);Cane - single point;BSC/3in1      Prior Function Prior Level of Function : Independent/Modified Independent                     Hand Dominance        Extremity/Trunk Assessment   Upper Extremity Assessment Upper Extremity Assessment: Overall WFL for tasks assessed  Lower Extremity Assessment Lower Extremity Assessment: LLE deficits/detail       Communication   Communication: No difficulties  Cognition Arousal/Alertness: Awake/alert Behavior During Therapy: WFL for tasks assessed/performed Overall Cognitive Status: Within Functional Limits for tasks assessed                                          General Comments      Exercises Total  Joint Exercises Ankle Circles/Pumps: AROM, Both, 15 reps, Supine   Assessment/Plan    PT Assessment Patient needs continued PT services  PT Problem List Decreased strength;Decreased range of motion;Decreased activity tolerance;Decreased balance;Decreased mobility;Decreased knowledge of use of DME;Pain       PT Treatment Interventions DME instruction;Gait training;Stair training;Functional mobility training;Therapeutic activities;Therapeutic exercise;Patient/family education    PT Goals (Current goals can be found in the Care Plan section)  Acute Rehab PT Goals Patient Stated Goal: Regain IND PT Goal Formulation: With patient Time For Goal Achievement: 08/31/22 Potential to Achieve Goals: Good    Frequency 7X/week     Co-evaluation               AM-PAC PT "6 Clicks" Mobility  Outcome Measure Help needed turning from your back to your side while in a flat bed without using bedrails?: A Little Help needed moving from lying on your back to sitting on the side of a flat bed without using bedrails?: A Little Help needed moving to and from a bed to a chair (including a wheelchair)?: A Little Help needed standing up from a chair using your arms (e.g., wheelchair or bedside chair)?: A Little Help needed to walk in hospital room?: A Little Help needed climbing 3-5 steps with a railing? : A Lot 6 Click Score: 17    End of Session Equipment Utilized During Treatment: Gait belt Activity Tolerance: Patient tolerated treatment well Patient left: in chair;with call bell/phone within reach;with chair alarm set;with family/visitor present Nurse Communication: Mobility status PT Visit Diagnosis: Difficulty in walking, not elsewhere classified (R26.2)    Time: 1610-9604 PT Time Calculation (min) (ACUTE ONLY): 26 min   Charges:   PT Evaluation $PT Eval Low Complexity: 1 Low PT Treatments $Gait Training: 8-22 mins PT General Charges $$ ACUTE PT VISIT: 1 Visit         Sarah Castro PT Acute Rehabilitation Services Pager [phone] Office [phone]   Sarah Castro 08/24/2022, 4:34 PM

## 2022-08-25 ENCOUNTER — Encounter (HOSPITAL_COMMUNITY): Payer: Self-pay | Admitting: Orthopedic Surgery

## 2022-08-25 DIAGNOSIS — M1712 Unilateral primary osteoarthritis, left knee: Secondary | ICD-10-CM | POA: Diagnosis not present

## 2022-08-25 LAB — BASIC METABOLIC PANEL
Anion gap: 6 (ref 5–15)
BUN: 11 mg/dL (ref 8–23)
CO2: 24 mmol/L (ref 22–32)
Calcium: 8.1 mg/dL — ABNORMAL LOW (ref 8.9–10.3)
Chloride: 101 mmol/L (ref 98–111)
Creatinine, Ser: 0.65 mg/dL (ref 0.44–1.00)
GFR, Estimated: >60 mL/min (ref >60)
Glucose, Bld: 152 mg/dL — ABNORMAL HIGH (ref 70–99)
Potassium: 3.9 mmol/L (ref 3.5–5.1)
Sodium: 131 mmol/L — ABNORMAL LOW (ref 135–145)

## 2022-08-25 LAB — CBC
HCT: 32.1 % — ABNORMAL LOW (ref 36.0–46.0)
Hemoglobin: 10.5 g/dL — ABNORMAL LOW (ref 12.0–15.0)
MCH: 31.7 pg (ref 26.0–34.0)
MCHC: 32.7 g/dL (ref 30.0–36.0)
MCV: 97 fL (ref 80.0–100.0)
Platelets: 193 10*3/uL (ref 150–400)
RBC: 3.31 MIL/uL — ABNORMAL LOW (ref 3.87–5.11)
RDW: 12.7 % (ref 11.5–15.5)
WBC: 8.6 10*3/uL (ref 4.0–10.5)
nRBC: 0 % (ref 0.0–0.2)

## 2022-08-25 MED ORDER — OXYCODONE HCL 5 MG PO TABS: 5.0000 mg | ORAL_TABLET | ORAL | 0 refills | Status: DC | PRN

## 2022-08-25 MED ORDER — POLYETHYLENE GLYCOL 3350 17 G PO PACK: 17.0000 g | PACK | Freq: Two times a day (BID) | ORAL | 0 refills | Status: DC

## 2022-08-25 MED ORDER — SENNA 8.6 MG PO TABS: 2.0000 | ORAL_TABLET | Freq: Every day | ORAL | 0 refills | Status: AC

## 2022-08-25 MED ORDER — MELOXICAM 15 MG PO TABS: 15.0000 mg | ORAL_TABLET | Freq: Every day | ORAL | 1 refills | Status: DC

## 2022-08-25 MED ORDER — METHOCARBAMOL 500 MG PO TABS: 500.0000 mg | ORAL_TABLET | Freq: Four times a day (QID) | ORAL | 2 refills | Status: DC | PRN

## 2022-08-25 MED ORDER — ASPIRIN 81 MG PO CHEW: 81.0000 mg | CHEWABLE_TABLET | Freq: Two times a day (BID) | ORAL | 0 refills | Status: AC

## 2022-08-25 NOTE — Progress Notes (Signed)
Physical Therapy Treatment Patient Details Name: Sarah Castro MRN: [id number] DOB: Mar 31, 1940 Today's Date: 08/25/2022   History of Present Illness Pt s/p L TKR and with hx of R TKR, CVA and macular degeneration    PT Comments  Pt continues motivated and progressing well with mobility.  Pt up to ambulate in hall, negotiated stair, and reviewed written HEP.  Pt and dtr eager for return home this date.    Assistance Recommended at Discharge Intermittent Supervision/Assistance  If plan is discharge home, recommend the following:  Can travel by private vehicle    A little help with walking and/or transfers;A little help with bathing/dressing/bathroom;Assistance with cooking/housework;Assist for transportation;Help with stairs or ramp for entrance      Equipment Recommendations       Recommendations for Other Services       Precautions / Restrictions Precautions Precautions: Knee;Fall Restrictions Weight Bearing Restrictions: No LLE Weight Bearing: Weight bearing as tolerated Other Position/Activity Restrictions: WBAT     Mobility  Bed Mobility Overal bed mobility: Needs Assistance Bed Mobility: Supine to Sit     Supine to sit: Supervision     General bed mobility comments: for safety    Transfers Overall transfer level: Needs assistance Equipment used: Rolling walker (2 wheels) Transfers: Sit to/from Stand Sit to Stand: Min guard           General transfer comment: cues for LE management and use of UEs to self assist    Ambulation/Gait Ambulation/Gait assistance: Min guard, Supervision Gait Distance (Feet): 70 Feet Assistive device: Rolling walker (2 wheels) Gait Pattern/deviations: Step-to pattern, Decreased step length - right, Decreased step length - left, Shuffle, Trunk flexed Gait velocity: decr     General Gait Details: cues for sequence, posture and position from RW   Stairs Stairs: Yes Stairs assistance: Min assist Stair Management:  One rail Left, Step to pattern, Forwards Number of Stairs: 1 General stair comments: cues for sequence and RW placement   Wheelchair Mobility     Tilt Bed    Modified Rankin (Stroke Patients Only)       Balance Overall balance assessment: Needs assistance Sitting-balance support: No upper extremity supported, Feet supported Sitting balance-Leahy Scale: Fair     Standing balance support: Single extremity supported Standing balance-Leahy Scale: Poor                              Cognition Arousal/Alertness: Awake/alert Behavior During Therapy: WFL for tasks assessed/performed Overall Cognitive Status: Within Functional Limits for tasks assessed                                          Exercises Total Joint Exercises Ankle Circles/Pumps: AROM, Both, 15 reps, Supine Quad Sets: AROM, Both, 10 reps, Supine Heel Slides: AAROM, Left, 15 reps, Supine Straight Leg Raises: AAROM, AROM, Left, 10 reps, Supine Long Arc Quad: Left, 10 reps, Seated, AAROM, AROM Goniometric ROM: -5 - 100    General Comments        Pertinent Vitals/Pain Pain Assessment Pain Assessment: 0-10 Pain Score: 5  Pain Location: L knee Pain Descriptors / Indicators: Aching, Sore Pain Intervention(s): Limited activity within patient's tolerance, Monitored during session, Premedicated before session, Ice applied    Home Living  Prior Function            PT Goals (current goals can now be found in the care plan section) Acute Rehab PT Goals Patient Stated Goal: Regain IND PT Goal Formulation: With patient Time For Goal Achievement: 08/31/22 Potential to Achieve Goals: Good Progress towards PT goals: Progressing toward goals    Frequency    7X/week      PT Plan Current plan remains appropriate    Co-evaluation              AM-PAC PT "6 Clicks" Mobility   Outcome Measure  Help needed turning from your back to your  side while in a flat bed without using bedrails?: A Little Help needed moving from lying on your back to sitting on the side of a flat bed without using bedrails?: A Little Help needed moving to and from a bed to a chair (including a wheelchair)?: A Little Help needed standing up from a chair using your arms (e.g., wheelchair or bedside chair)?: A Little Help needed to walk in hospital room?: A Little Help needed climbing 3-5 steps with a railing? : A Little 6 Click Score: 18    End of Session Equipment Utilized During Treatment: Gait belt Activity Tolerance: Patient tolerated treatment well Patient left: in chair;with call bell/phone within reach;with chair alarm set;with family/visitor present Nurse Communication: Mobility status PT Visit Diagnosis: Difficulty in walking, not elsewhere classified (R26.2)     Time: 7341-9379 PT Time Calculation (min) (ACUTE ONLY): 25 min  Charges:    $Gait Training: 8-22 mins $Therapeutic Exercise: 8-22 mins $Therapeutic Activity: 8-22 mins PT General Charges $$ ACUTE PT VISIT: 1 Visit                     Mauro Kaufmann PT Acute Rehabilitation Services Pager [phone] Office [phone]    [name] 08/25/2022, 12:25 PM

## 2022-08-25 NOTE — Plan of Care (Signed)
O2 1L via N/C this shift while sleeping to keep O2 Sat > 92.  Removed O2 this AM when awake and encouraged incentive spirometer with 1000 cc achieved.  Breath sounds diminished without crackles.  Pitting edema BLE.  Declined pain medication other than Tylenol this morning.

## 2022-08-25 NOTE — Care Management Obs Status (Signed)
MEDICARE OBSERVATION STATUS NOTIFICATION   Patient Details  Name: Sarah Castro MRN: [id number] Date of Birth: 05/14/1940   Medicare Observation Status Notification Given:  Yes    Ewing Schlein, LCSW 08/25/2022, 10:42 AM

## 2022-08-25 NOTE — Progress Notes (Signed)
   Subjective: 1 Day Post-Op Procedure(s) (LRB): TOTAL KNEE ARTHROPLASTY (Left) Patient reports pain as mild.   Patient seen in rounds for Dr. Charlann Boxer. Patient is well, and has had no acute complaints or problems. No acute events overnight. Foley catheter removed. Patient ambulated 16 feet with PT.  We will start therapy today.   Objective: Vital signs in last 24 hours: Temp:  [96.7 F (35.9 C)-98.3 F (36.8 C)] 97.7 F (36.5 C) (07/19 0540) Pulse Rate:  [53-76] 64 (07/19 0540) Resp:  [10-19] 16 (07/19 0540) BP: (127-193)/(48-88) 163/55 (07/19 0540) SpO2:  [89 %-100 %] 95 % (07/19 0540) Weight:  [61.2 kg] 61.2 kg (07/18 0922)  Intake/Output from previous day:  Intake/Output Summary (Last 24 hours) at 08/25/2022 0752 Last data filed at 08/25/2022 0750 Gross per 24 hour  Intake 3373.76 ml  Output 1825 ml  Net 1548.76 ml     Intake/Output this shift: Total I/O In: 120 [P.O.:120] Out: -   Labs: Recent Labs    08/25/22 0421  HGB 10.5*   Recent Labs    08/25/22 0421  WBC 8.6  RBC 3.31*  HCT 32.1*  PLT 193   Recent Labs    08/25/22 0421  NA 131*  K 3.9  CL 101  CO2 24  BUN 11  CREATININE 0.65  GLUCOSE 152*  CALCIUM 8.1*   No results for input(s): "LABPT", "INR" in the last 72 hours.  Exam: General - Patient is Alert and Oriented Extremity - Neurologically intact Sensation intact distally Intact pulses distally Dorsiflexion/Plantar flexion intact Dressing - dressing C/D/I Motor Function - intact, moving foot and toes well on exam.   Past Medical History:  Diagnosis Date   Arthritis    Depression with anxiety    Hypercholesteremia    Hypertension    Macular degeneration    PONV (postoperative nausea and vomiting)    Pre-diabetes    Stroke (HCC)     Assessment/Plan: 1 Day Post-Op Procedure(s) (LRB): TOTAL KNEE ARTHROPLASTY (Left) Principal Problem:   S/P total knee replacement, left Active Problems:   S/P total knee arthroplasty,  left  Estimated body mass index is 24.69 kg/m as calculated from the following:   Height as of this encounter: 5\' 2"  (1.575 m).   Weight as of this encounter: 61.2 kg. Advance diet Up with therapy D/C IV fluids   Patient's anticipated LOS is less than 2 midnights, meeting these requirements: - Younger than 12 - Lives within 1 hour of care - Has a competent adult at home to recover with post-op recover - NO history of  - Chronic pain requiring opiods  - Diabetes  - Coronary Artery Disease  - Heart failure  - Heart attack  - Stroke  - DVT/VTE  - Cardiac arrhythmia  - Respiratory Failure/COPD  - Renal failure  - Anemia  - Advanced Liver disease     DVT Prophylaxis - Aspirin Weight bearing as tolerated.  Hgb stable at 10.5 this AM. Up with PT.  Plan is to go Home after hospital stay. Plan for discharge today following 1-2 sessions of PT as long as they are meeting their goals. Patient is scheduled for OPPT. Follow up in the office in 2 weeks.   Rosalene Billings, PA-C Orthopedic Surgery [phone] 08/25/2022, 7:52 AM

## 2022-08-25 NOTE — TOC Transition Note (Signed)
Transition of Care [facility]) - CM/SW Discharge Note  Patient Details  Name: Sarah Castro MRN: [id number] Date of Birth: Mar 18, 1940  [facility] CM/SW Contact:  Ewing Schlein, LCSW Phone Number: 08/25/2022, 10:43 AM  Clinical Narrative: Patient is expected to discharge home after working with PT. CSW met with patient to confirm discharge plan. Patient will go home with OPPT at [facility]. Patient has a rolling walker, BSC, and cane at home so there are no DME needs at this time. TOC signing off.    Final next level of care: OP Rehab Barriers to Discharge: No Barriers Identified  Patient Goals and CMS Choice Choice offered to / list presented to : NA  Discharge Plan and Services Additional resources added to the After Visit Summary for           DME Arranged: N/A DME Agency: NA  Social Determinants of Health (SDOH) Interventions SDOH Screenings   Food Insecurity: No Food Insecurity (08/24/2022)  Housing: Low Risk  (08/24/2022)  Transportation Needs: No Transportation Needs (08/24/2022)  Utilities: Not At Risk (08/24/2022)  Tobacco Use: Medium Risk (08/24/2022)   Readmission Risk Interventions     No data to display

## 2022-08-25 NOTE — Progress Notes (Signed)
Physical Therapy Treatment Patient Details Name: Sarah Castro MRN: [id number] DOB: 03/05/1940 Today's Date: 08/25/2022   History of Present Illness Pt s/p L TKR and with hx of R TKR, CVA and macular degeneration    PT Comments  Pt very cooperative and progressing with mobility.  Pt performed HEP and up from bed to ambulate increased but still limited distance in hall.  Pt hopeful for dc home later today.    Assistance Recommended at Discharge Intermittent Supervision/Assistance  If plan is discharge home, recommend the following:  Can travel by private vehicle    A little help with walking and/or transfers;A little help with bathing/dressing/bathroom;Assistance with cooking/housework;Assist for transportation;Help with stairs or ramp for entrance      Equipment Recommendations       Recommendations for Other Services       Precautions / Restrictions Precautions Precautions: Knee;Fall Restrictions Weight Bearing Restrictions: No LLE Weight Bearing: Weight bearing as tolerated Other Position/Activity Restrictions: WBAT     Mobility  Bed Mobility Overal bed mobility: Needs Assistance Bed Mobility: Supine to Sit     Supine to sit: Supervision     General bed mobility comments: fro safety    Transfers Overall transfer level: Needs assistance Equipment used: Rolling walker (2 wheels) Transfers: Sit to/from Stand Sit to Stand: Min guard           General transfer comment: cues for LE management and use of UEs to self assist    Ambulation/Gait Ambulation/Gait assistance: Min assist, Min guard Gait Distance (Feet): 44 Feet Assistive device: Rolling walker (2 wheels) Gait Pattern/deviations: Step-to pattern, Decreased step length - right, Decreased step length - left, Shuffle, Trunk flexed Gait velocity: decr     General Gait Details: cues for sequence, posture and position from [employer]             Wheelchair Mobility     Tilt Bed     Modified Rankin (Stroke Patients Only)       Balance Overall balance assessment: Needs assistance Sitting-balance support: No upper extremity supported, Feet supported Sitting balance-Leahy Scale: Fair     Standing balance support: Single extremity supported Standing balance-Leahy Scale: Poor                              Cognition Arousal/Alertness: Awake/alert Behavior During Therapy: WFL for tasks assessed/performed Overall Cognitive Status: Within Functional Limits for tasks assessed                                          Exercises Total Joint Exercises Ankle Circles/Pumps: AROM, Both, 15 reps, Supine Quad Sets: AROM, Both, 10 reps, Supine Heel Slides: AAROM, Left, 15 reps, Supine Straight Leg Raises: AAROM, AROM, Left, 10 reps, Supine Long Arc Quad: Left, 10 reps, Seated, AAROM, AROM Goniometric ROM: -5 - 100    General Comments        Pertinent Vitals/Pain Pain Assessment Pain Assessment: 0-10 Pain Score: 5  Pain Location: L knee Pain Descriptors / Indicators: Aching, Sore Pain Intervention(s): Limited activity within patient's tolerance, Monitored during session, Premedicated before session, Ice applied    Home Living                          Prior Function  PT Goals (current goals can now be found in the care plan section) Acute Rehab PT Goals Patient Stated Goal: Regain IND PT Goal Formulation: With patient Time For Goal Achievement: 08/31/22 Potential to Achieve Goals: Good Progress towards PT goals: Progressing toward goals    Frequency    7X/week      PT Plan Current plan remains appropriate    Co-evaluation              AM-PAC PT "6 Clicks" Mobility   Outcome Measure  Help needed turning from your back to your side while in a flat bed without using bedrails?: A Little Help needed moving from lying on your back to sitting on the side of a flat bed without using bedrails?: A  Little Help needed moving to and from a bed to a chair (including a wheelchair)?: A Little Help needed standing up from a chair using your arms (e.g., wheelchair or bedside chair)?: A Little Help needed to walk in hospital room?: A Little Help needed climbing 3-5 steps with a railing? : A Lot 6 Click Score: 17    End of Session Equipment Utilized During Treatment: Gait belt Activity Tolerance: Patient tolerated treatment well Patient left: in chair;with call bell/phone within reach;with chair alarm set;with family/visitor present Nurse Communication: Mobility status PT Visit Diagnosis: Difficulty in walking, not elsewhere classified (R26.2)     Time: 7846-9629 PT Time Calculation (min) (ACUTE ONLY): 42 min  Charges:    $Gait Training: 8-22 mins $Therapeutic Exercise: 8-22 mins $Therapeutic Activity: 8-22 mins PT General Charges $$ ACUTE PT VISIT: 1 Visit                     Mauro Kaufmann PT Acute Rehabilitation Services Pager [phone] Office [phone]    Kashish Yglesias 08/25/2022, 12:17 PM

## 2022-08-25 NOTE — Progress Notes (Signed)
Nurse reviewed discharge instructions with pt.  Pt verbalized understanding of discharge instructions, follow up appointments and new medications.  No concerns at time of discharge. 

## 2022-08-28 DIAGNOSIS — M25562 Pain in left knee: Secondary | ICD-10-CM | POA: Diagnosis not present

## 2022-08-28 DIAGNOSIS — M25662 Stiffness of left knee, not elsewhere classified: Secondary | ICD-10-CM | POA: Diagnosis not present

## 2022-08-30 DIAGNOSIS — M25662 Stiffness of left knee, not elsewhere classified: Secondary | ICD-10-CM | POA: Diagnosis not present

## 2022-08-30 DIAGNOSIS — M25562 Pain in left knee: Secondary | ICD-10-CM | POA: Diagnosis not present

## 2022-09-01 DIAGNOSIS — M25562 Pain in left knee: Secondary | ICD-10-CM | POA: Diagnosis not present

## 2022-09-01 DIAGNOSIS — M25662 Stiffness of left knee, not elsewhere classified: Secondary | ICD-10-CM | POA: Diagnosis not present

## 2022-09-05 ENCOUNTER — Ambulatory Visit (HOSPITAL_COMMUNITY)
Admission: RE | Admit: 2022-09-05 | Discharge: 2022-09-05 | Disposition: A | Discharge status: Home / Self Care | Payer: Medicare (Managed Care) | Source: Ambulatory Visit | Attending: Cardiology | Admitting: Cardiology

## 2022-09-05 ENCOUNTER — Other Ambulatory Visit (HOSPITAL_COMMUNITY): Payer: Self-pay | Admitting: Family Medicine

## 2022-09-05 DIAGNOSIS — M79605 Pain in left leg: Secondary | ICD-10-CM | POA: Diagnosis not present

## 2022-09-05 NOTE — Discharge Summary (Signed)
Patient ID: Sarah Castro MRN: [id number] DOB/AGE: 82/11/1940 82 y.o.  Admit date: 08/24/2022 Discharge date: 08/25/2022  Admission Diagnoses:  Left knee osteoarthritis  Discharge Diagnoses:  Principal Problem:   S/P total knee replacement, left Active Problems:   S/P total knee arthroplasty, left   Past Medical History:  Diagnosis Date   Arthritis    Depression with anxiety    Hypercholesteremia    Hypertension    Macular degeneration    PONV (postoperative nausea and vomiting)    Pre-diabetes    Stroke [facility])     Surgeries: Procedure(s): TOTAL KNEE ARTHROPLASTY on 08/24/2022   Consultants:   Discharged Condition: Improved  Hospital Course: NORIANA ECHAVARRIA is an 82 y.o. female who was admitted 08/24/2022 for operative treatment ofS/P total knee replacement, left. Patient has severe unremitting pain that affects sleep, daily activities, and work/hobbies. After pre-op clearance the patient was taken to the operating room on 08/24/2022 and underwent  Procedure(s): TOTAL KNEE ARTHROPLASTY.    Patient was given perioperative antibiotics:  Anti-infectives (From admission, onward)    Start     Dose/Rate Route Frequency Ordered Stop   08/24/22 1700  ceFAZolin (ANCEF) IVPB 2g/100 mL premix        2 g 200 mL/hr over 30 Minutes Intravenous Every 6 hours 08/24/22 1507 08/25/22 0834   08/24/22 0930  ceFAZolin (ANCEF) IVPB 2g/100 mL premix        2 g 200 mL/hr over 30 Minutes Intravenous On call to O.R. 08/24/22 0920 08/24/22 1124        Patient was given sequential compression devices, early ambulation, and chemoprophylaxis to prevent DVT. Patient worked with PT and was meeting their goals regarding safe ambulation and transfers.  Patient benefited maximally from hospital stay and there were no complications.    Recent vital signs: No data found.   Recent laboratory studies: No results for input(s): "WBC", "HGB", "HCT", "PLT", "NA", "K", "CL", "CO2", "BUN",  "CREATININE", "GLUCOSE", "INR", "CALCIUM" in the last 72 hours.  Invalid input(s): "PT", "2"   Discharge Medications:   Allergies as of 08/25/2022       Reactions   Amlodipine Swelling   Garlic Diarrhea   Hydrocodone Nausea And Vomiting   Sulfa Antibiotics Rash        Medication List     STOP taking these medications    ibuprofen 200 MG tablet Commonly known as: ADVIL       TAKE these medications    AMBULATORY NON FORMULARY MEDICATION Rolling walker   aspirin 81 MG chewable tablet Chew 1 tablet (81 mg total) by mouth 2 (two) times daily for 28 days.   CALCIUM 600 + D PO Take 1 tablet by mouth 2 (two) times daily.   calcium carbonate 500 MG chewable tablet Commonly known as: TUMS - dosed in mg elemental calcium Chew 1,000 mg by mouth daily as needed for indigestion or heartburn.   cholecalciferol 10 MCG (400 UNIT) Tabs tablet Commonly known as: VITAMIN D3 Take 400 Units by mouth 2 (two) times daily.   cloNIDine 0.1 MG tablet Commonly known as: CATAPRES Take 0.05-0.1 mg by mouth daily as needed (SBP >180 for 30 days).   doxazosin 8 MG tablet Commonly known as: CARDURA Take 4 mg by mouth 2 (two) times daily.   escitalopram 10 MG tablet Commonly known as: LEXAPRO Take 10 mg by mouth daily.   fish oil-omega-3 fatty acids 1000 MG capsule Take 1 g by mouth 2 (two) times daily.  furosemide 40 MG tablet Commonly known as: LASIX Take 40 mg by mouth daily.   lovastatin 40 MG tablet Commonly known as: MEVACOR Take 20 mg by mouth at bedtime.   meloxicam 15 MG tablet Commonly known as: MOBIC Take 1 tablet (15 mg total) by mouth daily.   methocarbamol 500 MG tablet Commonly known as: ROBAXIN Take 1 tablet (500 mg total) by mouth every 6 (six) hours as needed for muscle spasms.   metoprolol succinate 25 MG 24 hr tablet Commonly known as: TOPROL-XL Take 25 mg by mouth every evening.   multivitamin with minerals tablet Take 1 tablet by mouth daily.    PreserVision AREDS 2 Caps Take 1 capsule by mouth 2 (two) times daily.   olmesartan 40 MG tablet Commonly known as: BENICAR Take 40 mg by mouth daily.   oxyCODONE 5 MG immediate release tablet Commonly known as: Oxy IR/ROXICODONE Take 1 tablet (5 mg total) by mouth every 4 (four) hours as needed for severe pain.   polyethylene glycol 17 g packet Commonly known as: MIRALAX / GLYCOLAX Take 17 g by mouth 2 (two) times daily.   potassium chloride 10 MEQ tablet Commonly known as: KLOR-CON M Take 10 mEq by mouth daily.   Red Yeast Rice Extract 600 MG Caps Take 600 mg by mouth 2 (two) times daily.   senna 8.6 MG Tabs tablet Commonly known as: SENOKOT Take 2 tablets (17.2 mg total) by mouth at bedtime for 14 days.   TURMERIC PO Take 400 mg by mouth 2 (two) times daily.               Discharge Care Instructions  (From admission, onward)           Start     Ordered   08/25/22 0000  Change dressing       Comments: Maintain surgical dressing until follow up in the clinic. If the edges start to pull up, may reinforce with tape. If the dressing is no longer working, may remove and cover with gauze and tape, but must keep the area dry and clean.  Call with any questions or concerns.   08/25/22 0754            Diagnostic Studies: [place] LIMITED JOINT SPACE STRUCTURES LOW RIGHT(NO LINKED CHARGES)  Result Date: 08/08/2022 Limited muscular skeletal ultrasound was performed and interpreted by Antoine Primas, M Limited ultrasound shows the patient does have narrowing of the medial joint space of follow-up of the foot.  Seems to be consistent with arthritis.  Patient does have what appears to be a fairly large intersubstance tearing noted of the posterior tibialis tendon with some mild retraction noted at the insertion of the navicular bone. Impression: Posterior tibialis tear    Disposition: Discharge disposition: 01-Home or Self Care       Discharge Instructions     Call  MD / Call 911   Complete by: As directed    If you experience chest pain or shortness of breath, CALL 911 and be transported to the hospital emergency room.  If you develope a fever above 101 F, pus (white drainage) or increased drainage or redness at the wound, or calf pain, call your surgeon's office.   Change dressing   Complete by: As directed    Maintain surgical dressing until follow up in the clinic. If the edges start to pull up, may reinforce with tape. If the dressing is no longer working, may remove and cover with gauze and tape,  but must keep the area dry and clean.  Call with any questions or concerns.   Constipation Prevention   Complete by: As directed    Drink plenty of fluids.  Prune juice may be helpful.  You may use a stool softener, such as Colace (over the counter) 100 mg twice a day.  Use MiraLax (over the counter) for constipation as needed.   Diet - low sodium heart healthy   Complete by: As directed    Increase activity slowly as tolerated   Complete by: As directed    Weight bearing as tolerated with assist device (walker, cane, etc) as directed, use it as long as suggested by your surgeon or therapist, typically at least 4-6 weeks.   Post-operative opioid taper instructions:   Complete by: As directed    POST-OPERATIVE OPIOID TAPER INSTRUCTIONS: It is important to wean off of your opioid medication as soon as possible. If you do not need pain medication after your surgery it is ok to stop day one. Opioids include: Codeine, Hydrocodone(Norco, Vicodin), Oxycodone(Percocet, oxycontin) and hydromorphone amongst others.  Long term and even short term use of opiods can cause: Increased pain response Dependence Constipation Depression Respiratory depression And more.  Withdrawal symptoms can include Flu like symptoms Nausea, vomiting And more Techniques to manage these symptoms Hydrate well Eat regular healthy meals Stay active Use relaxation techniques(deep  breathing, meditating, yoga) Do Not substitute Alcohol to help with tapering If you have been on opioids for less than two weeks and do not have pain than it is ok to stop all together.  Plan to wean off of opioids This plan should start within one week post op of your joint replacement. Maintain the same interval or time between taking each dose and first decrease the dose.  Cut the total daily intake of opioids by one tablet each day Next start to increase the time between doses. The last dose that should be eliminated is the evening dose.      TED hose   Complete by: As directed    Use stockings (TED hose) for 2 weeks on both leg(s).  You may remove them at night for sleeping.        Follow-up Information     Durene Romans, MD. Schedule an appointment as soon as possible for a visit in 2 week(s).   Specialty: Orthopedic Surgery Contact information: [address] [place] [address] [phone]                  Signed: Cassandria Anger 09/05/2022, 8:58 AM

## 2022-09-11 DIAGNOSIS — M25562 Pain in left knee: Secondary | ICD-10-CM | POA: Diagnosis not present

## 2022-09-11 DIAGNOSIS — M25662 Stiffness of left knee, not elsewhere classified: Secondary | ICD-10-CM | POA: Diagnosis not present

## 2022-09-15 NOTE — Progress Notes (Deleted)
[facility] [address] [place] [address] Phone: [phone] Subjective:    I'm seeing this patient by the request  of:  Georgann Housekeeper, MD  CC:   LKG:[id number]  08/07/2022 Appears to be chronic, discussed heel lift, icing regimen, home exercises.  Worsening symptoms can consider injection.  With patient's comorbidities would not be a surgical candidate.  Patient is actually going to be scheduled for surgery with a left total knee replacement at this moment.  Will see patient again in 6 weeks after surgery.      Update 09/18/2022 Sarah Castro is a 82 y.o. female coming in with complaint of R foot pain. TKR L July 2024. Patient states       Past Medical History:  Diagnosis Date   Arthritis    Depression with anxiety    Hypercholesteremia    Hypertension    Macular degeneration    PONV (postoperative nausea and vomiting)    Pre-diabetes    Stroke [facility])    Past Surgical History:  Procedure Laterality Date   APPENDECTOMY     broken back     BUNIONECTOMY  2013   CATARACT EXTRACTION     right   HAMMER TOE SURGERY  2013   HEMATOMA EVACUATION  5 years ago   IR RADIOLOGIST EVAL & MGMT  12/04/2019   KNEE ARTHROSCOPY     right   LAPAROSCOPIC APPENDECTOMY N/A 10/24/2012   Procedure: APPENDECTOMY LAPAROSCOPIC;  Surgeon: Emelia Loron, MD;  Location: [facility] OR;  Service: General;  Laterality: N/A;   LAPAROSCOPIC APPENDECTOMY     POSTERIOR TIBIAL TENDON REPAIR  1998   REPLACEMENT TOTAL KNEE Right    TONSILLECTOMY   age 62   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   Procedure: TOTAL KNEE ARTHROPLASTY;  Surgeon: [name], MD;  Location: [facility];  Service: Orthopedics;  Laterality: Right;   TOTAL KNEE ARTHROPLASTY Left 08/24/2022   Procedure: TOTAL KNEE ARTHROPLASTY;  Surgeon: Durene Romans, MD;  Location: [facility];  Service: Orthopedics;  Laterality: Left;   Social History   Socioeconomic History   Marital status: Married     Spouse name: Not on file   Number of children: 2   Years of education: college   Highest education level: Bachelor's degree (e.g., BA, AB, BS)  Occupational History   Occupation: Retired  Tobacco Use   Smoking status: Former    Current packs/day: 0.00    Average packs/day: 0.5 packs/day for 20.0 years (10.0 ttl pk-yrs)    Types: Cigarettes    Start date: 02/07/1991    Quit date: 02/07/2011    Years since quitting: 11.6   Smokeless tobacco: Never  Vaping Use   Vaping status: Never Used  Substance and Sexual Activity   Alcohol use: Yes    Comment: 2-3 glass of wine or vodka per day   Drug use: Never   Sexual activity: Not on file  Other Topics Concern   Not on file  Social History Narrative   Lives alone.   Left-handed.   One cup caffeine per day.   Social Determinants of Health   Financial Resource Strain: Not on file  Food Insecurity: No Food Insecurity (08/24/2022)   Hunger Vital Sign    Worried About Running Out of Food in the Last Year: Never true    Ran Out of Food in the Last Year: Never true  Transportation Needs: No Transportation Needs (08/24/2022)   PRAPARE - Transportation  Lack of Transportation (Medical): No    Lack of Transportation (Non-Medical): No  Physical Activity: Not on file  Stress: Not on file  Social Connections: Not on file   Allergies  Allergen Reactions   Amlodipine Swelling   Garlic Diarrhea   Hydrocodone Nausea And Vomiting   Sulfa Antibiotics Rash   Family History  Problem Relation Age of Onset   Hypertension Mother    Hypertension Father    Cancer Father        throat   Throat cancer Father    Cancer Sister        non hodgkins lymphoma   Non-Hodgkin's lymphoma Sister    Non-Hodgkin's lymphoma Brother    Colon cancer Maternal Grandmother 67   Prostate cancer Maternal Uncle      Current Outpatient Medications (Cardiovascular):    cloNIDine (CATAPRES) 0.1 MG tablet, Take 0.05-0.1 mg by mouth daily as needed (SBP >180 for 30  days).   doxazosin (CARDURA) 8 MG tablet, Take 4 mg by mouth 2 (two) times daily.   furosemide (LASIX) 40 MG tablet, Take 40 mg by mouth daily.   lovastatin (MEVACOR) 40 MG tablet, Take 20 mg by mouth at bedtime.   metoprolol succinate (TOPROL-XL) 25 MG 24 hr tablet, Take 25 mg by mouth every evening.   olmesartan (BENICAR) 40 MG tablet, Take 40 mg by mouth daily.   Current Outpatient Medications (Analgesics):    aspirin 81 MG chewable tablet, Chew 1 tablet (81 mg total) by mouth 2 (two) times daily for 28 days.   meloxicam (MOBIC) 15 MG tablet, Take 1 tablet (15 mg total) by mouth daily.   oxyCODONE (OXY IR/ROXICODONE) 5 MG immediate release tablet, Take 1 tablet (5 mg total) by mouth every 4 (four) hours as needed for severe pain.   Current Outpatient Medications (Other):    AMBULATORY NON FORMULARY MEDICATION, Rolling walker   calcium carbonate (TUMS - DOSED IN MG ELEMENTAL CALCIUM) 500 MG chewable tablet, Chew 1,000 mg by mouth daily as needed for indigestion or heartburn.   Calcium Carbonate-Vitamin D (CALCIUM 600 + D PO), Take 1 tablet by mouth 2 (two) times daily.   cholecalciferol (VITAMIN D) 400 UNITS TABS, Take 400 Units by mouth 2 (two) times daily.   escitalopram (LEXAPRO) 10 MG tablet, Take 10 mg by mouth daily.   fish oil-omega-3 fatty acids 1000 MG capsule, Take 1 g by mouth 2 (two) times daily.   methocarbamol (ROBAXIN) 500 MG tablet, Take 1 tablet (500 mg total) by mouth every 6 (six) hours as needed for muscle spasms.   Multiple Vitamins-Minerals (MULTIVITAMIN WITH MINERALS) tablet, Take 1 tablet by mouth daily.   Multiple Vitamins-Minerals (PRESERVISION AREDS 2) CAPS, Take 1 capsule by mouth 2 (two) times daily.   polyethylene glycol (MIRALAX / GLYCOLAX) 17 g packet, Take 17 g by mouth 2 (two) times daily.   potassium chloride (KLOR-CON M) 10 MEQ tablet, Take 10 mEq by mouth daily.   Red Yeast Rice Extract 600 MG CAPS, Take 600 mg by mouth 2 (two) times daily.   TURMERIC  PO, Take 400 mg by mouth 2 (two) times daily.   Reviewed prior external information including notes and imaging from  primary care provider As well as notes that were available from care everywhere and other healthcare systems.  Past medical history, social, surgical and family history all reviewed in electronic medical record.  No pertanent information unless stated regarding to the chief complaint.   Review of Systems:  No  headache, visual changes, nausea, vomiting, diarrhea, constipation, dizziness, abdominal pain, skin rash, fevers, chills, night sweats, weight loss, swollen lymph nodes, body aches, joint swelling, chest pain, shortness of breath, mood changes. POSITIVE muscle aches  Objective  There were no vitals taken for this visit.   General: No apparent distress alert and oriented x3 mood and affect normal, dressed appropriately.  HEENT: Pupils equal, extraocular movements intact  Respiratory: Patient's speak in full sentences and does not appear short of breath  Cardiovascular: No lower extremity edema, non tender, no erythema      Impression and Recommendations:

## 2022-09-18 ENCOUNTER — Ambulatory Visit: Payer: Medicare (Managed Care) | Admitting: Family Medicine

## 2022-09-20 DIAGNOSIS — Z96652 Presence of left artificial knee joint: Secondary | ICD-10-CM | POA: Diagnosis not present

## 2022-09-20 DIAGNOSIS — Z471 Aftercare following joint replacement surgery: Secondary | ICD-10-CM | POA: Diagnosis not present

## 2022-09-25 DIAGNOSIS — I10 Essential (primary) hypertension: Secondary | ICD-10-CM | POA: Diagnosis not present

## 2022-09-25 DIAGNOSIS — Z96652 Presence of left artificial knee joint: Secondary | ICD-10-CM | POA: Diagnosis not present

## 2022-09-25 DIAGNOSIS — I872 Venous insufficiency (chronic) (peripheral): Secondary | ICD-10-CM | POA: Diagnosis not present

## 2022-09-25 DIAGNOSIS — R7303 Prediabetes: Secondary | ICD-10-CM | POA: Diagnosis not present

## 2022-09-25 DIAGNOSIS — D649 Anemia, unspecified: Secondary | ICD-10-CM | POA: Diagnosis not present

## 2022-09-26 DIAGNOSIS — M25662 Stiffness of left knee, not elsewhere classified: Secondary | ICD-10-CM | POA: Diagnosis not present

## 2022-09-26 DIAGNOSIS — M25562 Pain in left knee: Secondary | ICD-10-CM | POA: Diagnosis not present

## 2022-09-28 DIAGNOSIS — M25562 Pain in left knee: Secondary | ICD-10-CM | POA: Diagnosis not present

## 2022-09-28 DIAGNOSIS — M25662 Stiffness of left knee, not elsewhere classified: Secondary | ICD-10-CM | POA: Diagnosis not present

## 2022-10-03 DIAGNOSIS — M25562 Pain in left knee: Secondary | ICD-10-CM | POA: Diagnosis not present

## 2022-10-03 DIAGNOSIS — M25662 Stiffness of left knee, not elsewhere classified: Secondary | ICD-10-CM | POA: Diagnosis not present

## 2022-10-05 DIAGNOSIS — M25562 Pain in left knee: Secondary | ICD-10-CM | POA: Diagnosis not present

## 2022-10-05 DIAGNOSIS — M25662 Stiffness of left knee, not elsewhere classified: Secondary | ICD-10-CM | POA: Diagnosis not present

## 2022-10-11 DIAGNOSIS — M25562 Pain in left knee: Secondary | ICD-10-CM | POA: Diagnosis not present

## 2022-10-11 DIAGNOSIS — M25662 Stiffness of left knee, not elsewhere classified: Secondary | ICD-10-CM | POA: Diagnosis not present

## 2022-10-12 NOTE — Progress Notes (Deleted)
[facility] [address] [place] [address] Phone: [phone] Subjective:    I'm seeing this patient by the request  of:  Georgann Housekeeper, MD  CC:   LKG:[id number]  08/07/2022 Appears to be chronic, discussed heel lift, icing regimen, home exercises.  Worsening symptoms can consider injection.  With patient's comorbidities would not be a surgical candidate.  Patient is actually going to be scheduled for surgery with a left total knee replacement at this moment.  Will see patient again in 6 weeks after surgery.     Updated 10/18/2022 Sarah Castro is a 82 y.o. female coming in with complaint of R foot pain       Past Medical History:  Diagnosis Date   Arthritis    Depression with anxiety    Hypercholesteremia    Hypertension    Macular degeneration    PONV (postoperative nausea and vomiting)    Pre-diabetes    Stroke [facility])    Past Surgical History:  Procedure Laterality Date   APPENDECTOMY     broken back     BUNIONECTOMY  2013   CATARACT EXTRACTION     right   HAMMER TOE SURGERY  2013   HEMATOMA EVACUATION  5 years ago   IR RADIOLOGIST EVAL & MGMT  12/04/2019   KNEE ARTHROSCOPY     right   LAPAROSCOPIC APPENDECTOMY N/A 10/24/2012   Procedure: APPENDECTOMY LAPAROSCOPIC;  Surgeon: Emelia Loron, MD;  Location: [facility];  Service: General;  Laterality: N/A;   LAPAROSCOPIC APPENDECTOMY     POSTERIOR TIBIAL TENDON REPAIR  1998   REPLACEMENT TOTAL KNEE Right    TONSILLECTOMY   age 56   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   Procedure: TOTAL KNEE ARTHROPLASTY;  Surgeon: [name], MD;  [facility]: [facility];  Service: Orthopedics;  Laterality: Right;   TOTAL KNEE ARTHROPLASTY Left 08/24/2022   Procedure: TOTAL KNEE ARTHROPLASTY;  Surgeon: Durene Romans, MD;  Location: [facility];  Service: Orthopedics;  Laterality: Left;   Social History   Socioeconomic History   Marital status: Married    Spouse name: Not on file   Number of  children: 2   Years of education: college   Highest education level: Bachelor's degree (e.g., BA, AB, BS)  Occupational History   Occupation: Retired  Tobacco Use   Smoking status: Former    Current packs/day: 0.00    Average packs/day: 0.5 packs/day for 20.0 years (10.0 ttl pk-yrs)    Types: Cigarettes    Start date: 02/07/1991    Quit date: 02/07/2011    Years since quitting: 11.6   Smokeless tobacco: Never  Vaping Use   Vaping status: Never Used  Substance and Sexual Activity   Alcohol use: Yes    Comment: 2-3 glass of wine or vodka per day   Drug use: Never   Sexual activity: Not on file  Other Topics Concern   Not on file  Social History Narrative   Lives alone.   Left-handed.   One cup caffeine per day.   Social Determinants of Health   Financial Resource Strain: Not on file  Food Insecurity: No Food Insecurity (08/24/2022)   Hunger Vital Sign    Worried About Running Out of Food in the Last Year: Never true    Ran Out of Food in the Last Year: Never true  Transportation Needs: No Transportation Needs (08/24/2022)   PRAPARE - Administrator, Civil Service (Medical): No  Lack of Transportation (Non-Medical): No  Physical Activity: Not on file  Stress: Not on file  Social Connections: Not on file   Allergies  Allergen Reactions   Amlodipine Swelling   Garlic Diarrhea   Hydrocodone Nausea And Vomiting   Sulfa Antibiotics Rash   Family History  Problem Relation Age of Onset   Hypertension Mother    Hypertension Father    Cancer Father        throat   Throat cancer Father    Cancer Sister        non hodgkins lymphoma   Non-Hodgkin's lymphoma Sister    Non-Hodgkin's lymphoma Brother    Colon cancer Maternal Grandmother 21   Prostate cancer Maternal Uncle      Current Outpatient Medications (Cardiovascular):    cloNIDine (CATAPRES) 0.1 MG tablet, Take 0.05-0.1 mg by mouth daily as needed (SBP >180 for 30 days).   doxazosin (CARDURA) 8 MG  tablet, Take 4 mg by mouth 2 (two) times daily.   furosemide (LASIX) 40 MG tablet, Take 40 mg by mouth daily.   lovastatin (MEVACOR) 40 MG tablet, Take 20 mg by mouth at bedtime.   metoprolol succinate (TOPROL-XL) 25 MG 24 hr tablet, Take 25 mg by mouth every evening.   olmesartan (BENICAR) 40 MG tablet, Take 40 mg by mouth daily.   Current Outpatient Medications (Analgesics):    meloxicam (MOBIC) 15 MG tablet, Take 1 tablet (15 mg total) by mouth daily.   oxyCODONE (OXY IR/ROXICODONE) 5 MG immediate release tablet, Take 1 tablet (5 mg total) by mouth every 4 (four) hours as needed for severe pain.   Current Outpatient Medications (Other):    AMBULATORY NON FORMULARY MEDICATION, Rolling walker   calcium carbonate (TUMS - DOSED IN MG ELEMENTAL CALCIUM) 500 MG chewable tablet, Chew 1,000 mg by mouth daily as needed for indigestion or heartburn.   Calcium Carbonate-Vitamin D (CALCIUM 600 + D PO), Take 1 tablet by mouth 2 (two) times daily.   cholecalciferol (VITAMIN D) 400 UNITS TABS, Take 400 Units by mouth 2 (two) times daily.   escitalopram (LEXAPRO) 10 MG tablet, Take 10 mg by mouth daily.   fish oil-omega-3 fatty acids 1000 MG capsule, Take 1 g by mouth 2 (two) times daily.   methocarbamol (ROBAXIN) 500 MG tablet, Take 1 tablet (500 mg total) by mouth every 6 (six) hours as needed for muscle spasms.   Multiple Vitamins-Minerals (MULTIVITAMIN WITH MINERALS) tablet, Take 1 tablet by mouth daily.   Multiple Vitamins-Minerals (PRESERVISION AREDS 2) CAPS, Take 1 capsule by mouth 2 (two) times daily.   polyethylene glycol (MIRALAX / GLYCOLAX) 17 g packet, Take 17 g by mouth 2 (two) times daily.   potassium chloride (KLOR-CON M) 10 MEQ tablet, Take 10 mEq by mouth daily.   Red Yeast Rice Extract 600 MG CAPS, Take 600 mg by mouth 2 (two) times daily.   TURMERIC PO, Take 400 mg by mouth 2 (two) times daily.   Reviewed prior external information including notes and imaging from  primary care  provider As well as notes that were available from care everywhere and other healthcare systems.  Past medical history, social, surgical and family history all reviewed in electronic medical record.  No pertanent information unless stated regarding to the chief complaint.   Review of Systems:  No headache, visual changes, nausea, vomiting, diarrhea, constipation, dizziness, abdominal pain, skin rash, fevers, chills, night sweats, weight loss, swollen lymph nodes, body aches, joint swelling, chest pain, shortness of breath,  mood changes. POSITIVE muscle aches  Objective  There were no vitals taken for this visit.   General: No apparent distress alert and oriented x3 mood and affect normal, dressed appropriately.  HEENT: Pupils equal, extraocular movements intact  Respiratory: Patient's speak in full sentences and does not appear short of breath  Cardiovascular: No lower extremity edema, non tender, no erythema      Impression and Recommendations:

## 2022-10-18 ENCOUNTER — Ambulatory Visit: Payer: Medicare (Managed Care) | Admitting: Family Medicine

## 2022-10-24 DIAGNOSIS — M25562 Pain in left knee: Secondary | ICD-10-CM | POA: Diagnosis not present

## 2022-10-24 DIAGNOSIS — M25662 Stiffness of left knee, not elsewhere classified: Secondary | ICD-10-CM | POA: Diagnosis not present

## 2022-11-27 DIAGNOSIS — D649 Anemia, unspecified: Secondary | ICD-10-CM | POA: Diagnosis not present

## 2023-01-24 DIAGNOSIS — E782 Mixed hyperlipidemia: Secondary | ICD-10-CM | POA: Diagnosis not present

## 2023-01-24 DIAGNOSIS — R7303 Prediabetes: Secondary | ICD-10-CM | POA: Diagnosis not present

## 2023-01-24 DIAGNOSIS — I679 Cerebrovascular disease, unspecified: Secondary | ICD-10-CM | POA: Diagnosis not present

## 2023-01-24 DIAGNOSIS — I700 Atherosclerosis of aorta: Secondary | ICD-10-CM | POA: Diagnosis not present

## 2023-01-24 DIAGNOSIS — I10 Essential (primary) hypertension: Secondary | ICD-10-CM | POA: Diagnosis not present

## 2023-01-24 DIAGNOSIS — I872 Venous insufficiency (chronic) (peripheral): Secondary | ICD-10-CM | POA: Diagnosis not present

## 2023-01-24 DIAGNOSIS — Z0000 Encounter for general adult medical examination without abnormal findings: Secondary | ICD-10-CM | POA: Diagnosis not present

## 2023-01-24 DIAGNOSIS — F411 Generalized anxiety disorder: Secondary | ICD-10-CM | POA: Diagnosis not present

## 2023-01-24 DIAGNOSIS — M109 Gout, unspecified: Secondary | ICD-10-CM | POA: Diagnosis not present

## 2023-01-24 DIAGNOSIS — J449 Chronic obstructive pulmonary disease, unspecified: Secondary | ICD-10-CM | POA: Diagnosis not present

## 2023-01-24 DIAGNOSIS — E871 Hypo-osmolality and hyponatremia: Secondary | ICD-10-CM | POA: Diagnosis not present

## 2023-02-12 ENCOUNTER — Ambulatory Visit: Payer: Medicare (Managed Care) | Admitting: Podiatry

## 2023-02-19 ENCOUNTER — Ambulatory Visit: Payer: Medicare (Managed Care) | Admitting: Podiatry

## 2023-03-19 ENCOUNTER — Ambulatory Visit (INDEPENDENT_AMBULATORY_CARE_PROVIDER_SITE_OTHER): Payer: Medicare Other | Admitting: Podiatry

## 2023-03-19 ENCOUNTER — Encounter: Payer: Self-pay | Admitting: Podiatry

## 2023-03-19 DIAGNOSIS — M79675 Pain in left toe(s): Secondary | ICD-10-CM

## 2023-03-19 DIAGNOSIS — M79674 Pain in right toe(s): Secondary | ICD-10-CM | POA: Diagnosis not present

## 2023-03-19 DIAGNOSIS — B351 Tinea unguium: Secondary | ICD-10-CM | POA: Diagnosis not present

## 2023-03-19 NOTE — Progress Notes (Signed)
 Subjective:   Patient ID: Sarah Castro, female   DOB: 83 y.o.   MRN: [id number]   HPI Chief Complaint  Patient presents with   RFC    RM#11 RFC referred by primary care to evaluate feet has some dryness of both feet needs nail trim.    83 year old female presents the office today for concerns of thick, elongated nails that she is not able to trim herself.  Denies any swelling or redness.  No open lesions.  She has a history of bunion surgery on her left foot.   Review of Systems  All other systems reviewed and are negative.  Past Medical History:  Diagnosis Date   Arthritis    Depression with anxiety    Hypercholesteremia    Hypertension    Macular degeneration    PONV (postoperative nausea and vomiting)    Pre-diabetes    Stroke [facility])     Past Surgical History:  Procedure Laterality Date   APPENDECTOMY     broken back     BUNIONECTOMY  2013   CATARACT EXTRACTION     right   HAMMER TOE SURGERY  2013   HEMATOMA EVACUATION  5 years ago   IR RADIOLOGIST EVAL & MGMT  12/04/2019   KNEE ARTHROSCOPY     right   LAPAROSCOPIC APPENDECTOMY N/A 10/24/2012   Procedure: APPENDECTOMY LAPAROSCOPIC;  Surgeon: Enid Harry, MD;  Location: [facility];  Service: General;  Laterality: N/A;   LAPAROSCOPIC APPENDECTOMY     POSTERIOR TIBIAL TENDON REPAIR  1998   REPLACEMENT TOTAL KNEE Right    TONSILLECTOMY   age 44   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   Procedure: TOTAL KNEE ARTHROPLASTY;  Surgeon: Bevin Bucks, MD;  Location: [facility];  Service: Orthopedics;  Laterality: Right;   TOTAL KNEE ARTHROPLASTY Left 08/24/2022   Procedure: TOTAL KNEE ARTHROPLASTY;  Surgeon: Claiborne Crew, MD;  Location: [facility];  Service: Orthopedics;  Laterality: Left;     Current Outpatient Medications:    AMBULATORY NON FORMULARY MEDICATION, Rolling walker, Disp: 1 Units, Rfl: 0   calcium carbonate (TUMS - DOSED IN MG ELEMENTAL CALCIUM) 500 MG chewable tablet, Chew 1,000 mg by mouth daily as  needed for indigestion or heartburn., Disp: , Rfl:    Calcium Carbonate-Vitamin D  (CALCIUM 600 + D PO), Take 1 tablet by mouth 2 (two) times daily., Disp: , Rfl:    cholecalciferol  (VITAMIN D ) 400 UNITS TABS, Take 400 Units by mouth 2 (two) times daily., Disp: , Rfl:    cloNIDine  (CATAPRES ) 0.1 MG tablet, Take 0.05-0.1 mg by mouth daily as needed (SBP >180 for 30 days)., Disp: , Rfl:    doxazosin  (CARDURA ) 8 MG tablet, Take 4 mg by mouth 2 (two) times daily., Disp: , Rfl:    escitalopram  (LEXAPRO ) 10 MG tablet, Take 10 mg by mouth daily., Disp: , Rfl:    fish oil-omega-3 fatty acids 1000 MG capsule, Take 1 g by mouth 2 (two) times daily., Disp: , Rfl:    furosemide (LASIX) 40 MG tablet, Take 40 mg by mouth daily., Disp: , Rfl:    lovastatin (MEVACOR) 40 MG tablet, Take 20 mg by mouth at bedtime., Disp: , Rfl:    meloxicam  (MOBIC ) 15 MG tablet, Take 1 tablet (15 mg total) by mouth daily., Disp: 30 tablet, Rfl: 1   methocarbamol  (ROBAXIN ) 500 MG tablet, Take 1 tablet (500 mg total) by mouth every 6 (six) hours as needed for muscle spasms., Disp: 40 tablet, Rfl: 2  metoprolol  succinate (TOPROL -XL) 25 MG 24 hr tablet, Take 25 mg by mouth every evening., Disp: , Rfl:    Multiple Vitamins-Minerals (MULTIVITAMIN WITH MINERALS) tablet, Take 1 tablet by mouth daily., Disp: , Rfl:    Multiple Vitamins-Minerals (PRESERVISION AREDS 2) CAPS, Take 1 capsule by mouth 2 (two) times daily., Disp: , Rfl:    olmesartan (BENICAR) 40 MG tablet, Take 40 mg by mouth daily., Disp: , Rfl:    oxyCODONE  (OXY IR/ROXICODONE ) 5 MG immediate release tablet, Take 1 tablet (5 mg total) by mouth every 4 (four) hours as needed for severe pain., Disp: 42 tablet, Rfl: 0   polyethylene glycol (MIRALAX  / GLYCOLAX ) 17 g packet, Take 17 g by mouth 2 (two) times daily., Disp: 14 each, Rfl: 0   potassium chloride  (KLOR-CON  M) 10 MEQ tablet, Take 10 mEq by mouth daily., Disp: , Rfl:    Red Yeast Rice Extract 600 MG CAPS, Take 600 mg by  mouth 2 (two) times daily., Disp: , Rfl:    TURMERIC PO, Take 400 mg by mouth 2 (two) times daily., Disp: , Rfl:   Allergies  Allergen Reactions   Amlodipine  Swelling   Garlic Diarrhea   Hydrocodone  Nausea And Vomiting   Sulfa Antibiotics Rash          Objective:  Physical Exam  General: AAO x3, NAD  Dermatological: Nails are hypertrophic, dystrophic, brittle, discolored, elongated 10.  Upon debridement of the right hallux, there is some minimal clear drainage the nail somewhat loose distally and there is quite a bit of buildup underneath the toenail.  Tenderness nails 1-5 bilaterally. No open lesions or pre-ulcerative lesions are identified today.   Vascular: Dorsalis Pedis artery and Posterior Tibial artery pedal pulses are 2/4 bilateral with immedate capillary fill time.  There is no pain with calf compression, swelling, warmth, erythema.   Neruologic: Grossly intact via light touch bilateral.   Musculoskeletal: First MPJ arthrodesis on the left foot.  Digital contractures present.      Assessment:   Symptomatic onychomycosis     Plan:  -Treatment options discussed including all alternatives, risks, and complications -Etiology of symptoms were discussed -Sharply debrided nails x 10.  Upon debridement of right hallux toenail there is very minimal clear drainage.  Is able to debride quite a bit of buildup underneath the toenail.  Recommend antibiotic ointment dressing changes daily.  Discussed Epsom salt soaks for the week.  Monitor any signs or symptoms of infection.    Return in about 3 months (around 06/16/2023) for nail trim.  Charity Conch DPM

## 2023-03-19 NOTE — Patient Instructions (Signed)

## 2023-05-08 ENCOUNTER — Emergency Department (HOSPITAL_COMMUNITY)

## 2023-05-08 ENCOUNTER — Other Ambulatory Visit: Payer: Self-pay

## 2023-05-08 ENCOUNTER — Inpatient Hospital Stay (HOSPITAL_COMMUNITY)
Admission: EM | Admit: 2023-05-08 | Discharge: 2023-05-17 | DRG: 481 | Disposition: A | Discharge status: Skilled Nursing Facility | Attending: Internal Medicine | Admitting: Internal Medicine

## 2023-05-08 ENCOUNTER — Encounter (HOSPITAL_COMMUNITY): Payer: Self-pay

## 2023-05-08 DIAGNOSIS — E875 Hyperkalemia: Secondary | ICD-10-CM | POA: Diagnosis present

## 2023-05-08 DIAGNOSIS — D62 Acute posthemorrhagic anemia: Secondary | ICD-10-CM | POA: Diagnosis not present

## 2023-05-08 DIAGNOSIS — W010XXA Fall on same level from slipping, tripping and stumbling without subsequent striking against object, initial encounter: Secondary | ICD-10-CM | POA: Diagnosis present

## 2023-05-08 DIAGNOSIS — D649 Anemia, unspecified: Secondary | ICD-10-CM | POA: Diagnosis not present

## 2023-05-08 DIAGNOSIS — S72332A Displaced oblique fracture of shaft of left femur, initial encounter for closed fracture: Secondary | ICD-10-CM | POA: Diagnosis present

## 2023-05-08 DIAGNOSIS — Z8249 Family history of ischemic heart disease and other diseases of the circulatory system: Secondary | ICD-10-CM | POA: Diagnosis not present

## 2023-05-08 DIAGNOSIS — I440 Atrioventricular block, first degree: Secondary | ICD-10-CM | POA: Diagnosis present

## 2023-05-08 DIAGNOSIS — H353290 Exudative age-related macular degeneration, unspecified eye, stage unspecified: Secondary | ICD-10-CM | POA: Diagnosis present

## 2023-05-08 DIAGNOSIS — Z87891 Personal history of nicotine dependence: Secondary | ICD-10-CM

## 2023-05-08 DIAGNOSIS — Z882 Allergy status to sulfonamides status: Secondary | ICD-10-CM | POA: Diagnosis not present

## 2023-05-08 DIAGNOSIS — W1830XA Fall on same level, unspecified, initial encounter: Secondary | ICD-10-CM | POA: Insufficient documentation

## 2023-05-08 DIAGNOSIS — Z96653 Presence of artificial knee joint, bilateral: Secondary | ICD-10-CM | POA: Diagnosis present

## 2023-05-08 DIAGNOSIS — R7303 Prediabetes: Secondary | ICD-10-CM | POA: Diagnosis present

## 2023-05-08 DIAGNOSIS — M7989 Other specified soft tissue disorders: Secondary | ICD-10-CM | POA: Diagnosis not present

## 2023-05-08 DIAGNOSIS — I10 Essential (primary) hypertension: Secondary | ICD-10-CM | POA: Diagnosis present

## 2023-05-08 DIAGNOSIS — Z8673 Personal history of transient ischemic attack (TIA), and cerebral infarction without residual deficits: Secondary | ICD-10-CM

## 2023-05-08 DIAGNOSIS — Z888 Allergy status to other drugs, medicaments and biological substances status: Secondary | ICD-10-CM | POA: Diagnosis not present

## 2023-05-08 DIAGNOSIS — F419 Anxiety disorder, unspecified: Secondary | ICD-10-CM | POA: Diagnosis present

## 2023-05-08 DIAGNOSIS — Y92009 Unspecified place in unspecified non-institutional (private) residence as the place of occurrence of the external cause: Secondary | ICD-10-CM

## 2023-05-08 DIAGNOSIS — F32A Depression, unspecified: Secondary | ICD-10-CM | POA: Diagnosis present

## 2023-05-08 DIAGNOSIS — Z9841 Cataract extraction status, right eye: Secondary | ICD-10-CM | POA: Diagnosis not present

## 2023-05-08 DIAGNOSIS — Z791 Long term (current) use of non-steroidal anti-inflammatories (NSAID): Secondary | ICD-10-CM

## 2023-05-08 DIAGNOSIS — F39 Unspecified mood [affective] disorder: Secondary | ICD-10-CM | POA: Diagnosis present

## 2023-05-08 DIAGNOSIS — S7292XA Unspecified fracture of left femur, initial encounter for closed fracture: Principal | ICD-10-CM

## 2023-05-08 DIAGNOSIS — I7090 Unspecified atherosclerosis: Secondary | ICD-10-CM | POA: Diagnosis not present

## 2023-05-08 DIAGNOSIS — Z885 Allergy status to narcotic agent status: Secondary | ICD-10-CM

## 2023-05-08 DIAGNOSIS — M80852A Other osteoporosis with current pathological fracture, left femur, initial encounter for fracture: Principal | ICD-10-CM | POA: Diagnosis present

## 2023-05-08 DIAGNOSIS — E7800 Pure hypercholesterolemia, unspecified: Secondary | ICD-10-CM | POA: Diagnosis present

## 2023-05-08 DIAGNOSIS — M25552 Pain in left hip: Secondary | ICD-10-CM | POA: Diagnosis present

## 2023-05-08 DIAGNOSIS — S72142A Displaced intertrochanteric fracture of left femur, initial encounter for closed fracture: Secondary | ICD-10-CM | POA: Diagnosis not present

## 2023-05-08 DIAGNOSIS — Z79899 Other long term (current) drug therapy: Secondary | ICD-10-CM | POA: Diagnosis not present

## 2023-05-08 LAB — BASIC METABOLIC PANEL WITH GFR
Anion gap: 12 (ref 5–15)
BUN: 25 mg/dL — ABNORMAL HIGH (ref 8–23)
CO2: 23 mmol/L (ref 22–32)
Calcium: 8.7 mg/dL — ABNORMAL LOW (ref 8.9–10.3)
Chloride: 99 mmol/L (ref 98–111)
Creatinine, Ser: 0.86 mg/dL (ref 0.44–1.00)
GFR, Estimated: >60 mL/min (ref >60)
Glucose, Bld: 167 mg/dL — ABNORMAL HIGH (ref 70–99)
Potassium: 5 mmol/L (ref 3.5–5.1)
Sodium: 134 mmol/L — ABNORMAL LOW (ref 135–145)

## 2023-05-08 LAB — CBC WITH DIFFERENTIAL/PLATELET
Abs Immature Granulocytes: 0.02 10*3/uL (ref 0.00–0.07)
Basophils Absolute: 0 10*3/uL (ref 0.0–0.1)
Basophils Relative: 0 %
Eosinophils Absolute: 0 10*3/uL (ref 0.0–0.5)
Eosinophils Relative: 0 %
HCT: 27 % — ABNORMAL LOW (ref 36.0–46.0)
Hemoglobin: 9 g/dL — ABNORMAL LOW (ref 12.0–15.0)
Immature Granulocytes: 0 %
Lymphocytes Relative: 12 %
Lymphs Abs: 1 10*3/uL (ref 0.7–4.0)
MCH: 31.4 pg (ref 26.0–34.0)
MCHC: 33.3 g/dL (ref 30.0–36.0)
MCV: 94.1 fL (ref 80.0–100.0)
Monocytes Absolute: 0.8 10*3/uL (ref 0.1–1.0)
Monocytes Relative: 10 %
Neutro Abs: 6.3 10*3/uL (ref 1.7–7.7)
Neutrophils Relative %: 78 %
Platelets: 166 10*3/uL (ref 150–400)
RBC: 2.87 MIL/uL — ABNORMAL LOW (ref 3.87–5.11)
RDW: 12.7 % (ref 11.5–15.5)
WBC: 8.1 10*3/uL (ref 4.0–10.5)
nRBC: 0 % (ref 0.0–0.2)

## 2023-05-08 LAB — CK: Total CK: 4446 U/L — ABNORMAL HIGH (ref 38–234)

## 2023-05-08 MED ORDER — SODIUM CHLORIDE 0.9% FLUSH
3.0000 mL | Freq: Two times a day (BID) | INTRAVENOUS | Status: DC
  Administered 2023-05-08 – 2023-05-16 (×16): 3 mL via INTRAVENOUS

## 2023-05-08 MED ORDER — HYDROMORPHONE HCL 1 MG/ML IJ SOLN
0.5000 mg | INTRAMUSCULAR | Status: DC | PRN
  Administered 2023-05-08 – 2023-05-12 (×9): 0.5 mg via INTRAVENOUS
  Filled 2023-05-08 (×10): qty 0.5

## 2023-05-08 MED ORDER — ACETAMINOPHEN 500 MG PO TABS
1000.0000 mg | ORAL_TABLET | Freq: Four times a day (QID) | ORAL | Status: DC | PRN
  Administered 2023-05-13 – 2023-05-15 (×6): 1000 mg via ORAL
  Filled 2023-05-08 (×6): qty 2

## 2023-05-08 MED ORDER — OXYCODONE HCL 5 MG PO TABS
5.0000 mg | ORAL_TABLET | ORAL | Status: DC | PRN
  Administered 2023-05-08 – 2023-05-17 (×20): 5 mg via ORAL
  Filled 2023-05-08 (×20): qty 1

## 2023-05-08 MED ORDER — ONDANSETRON HCL 4 MG/2ML IJ SOLN: 4.0000 mg | Freq: Four times a day (QID) | INTRAMUSCULAR | Status: DC | PRN

## 2023-05-08 MED ORDER — METOPROLOL SUCCINATE ER 25 MG PO TB24
25.0000 mg | ORAL_TABLET | Freq: Every evening | ORAL | Status: DC
  Administered 2023-05-08 – 2023-05-16 (×9): 25 mg via ORAL
  Filled 2023-05-08 (×9): qty 1

## 2023-05-08 MED ORDER — PRAVASTATIN SODIUM 40 MG PO TABS
20.0000 mg | ORAL_TABLET | Freq: Every day | ORAL | Status: DC
  Administered 2023-05-09 – 2023-05-16 (×8): 20 mg via ORAL
  Filled 2023-05-08 (×8): qty 1

## 2023-05-08 MED ORDER — MELATONIN 3 MG PO TABS
6.0000 mg | ORAL_TABLET | Freq: Every evening | ORAL | Status: DC | PRN
Start: 2023-05-08 — End: 2023-05-17
  Administered 2023-05-11 – 2023-05-15 (×5): 6 mg via ORAL
  Filled 2023-05-08 (×5): qty 2

## 2023-05-08 MED ORDER — POLYETHYLENE GLYCOL 3350 17 G PO PACK: 17.0000 g | PACK | Freq: Every day | ORAL | Status: DC | PRN

## 2023-05-08 MED ORDER — FENTANYL CITRATE PF 50 MCG/ML IJ SOSY
100.0000 ug | PREFILLED_SYRINGE | Freq: Once | INTRAMUSCULAR | Status: AC
  Administered 2023-05-08: 100 ug via INTRAVENOUS
  Filled 2023-05-08: qty 2

## 2023-05-08 MED ORDER — OXYCODONE HCL 5 MG PO TABS
2.5000 mg | ORAL_TABLET | ORAL | Status: DC | PRN
  Administered 2023-05-10: 2.5 mg via ORAL
  Filled 2023-05-08 (×3): qty 1

## 2023-05-08 MED ORDER — SODIUM CHLORIDE 0.9 % IV BOLUS
500.0000 mL | Freq: Once | INTRAVENOUS | Status: AC
  Administered 2023-05-08: 500 mL via INTRAVENOUS

## 2023-05-08 MED ORDER — VITAMIN D 25 MCG (1000 UNIT) PO TABS
1000.0000 [IU] | ORAL_TABLET | Freq: Every day | ORAL | Status: DC
  Administered 2023-05-08 – 2023-05-17 (×10): 1000 [IU] via ORAL
  Filled 2023-05-08 (×10): qty 1

## 2023-05-08 MED ORDER — ESCITALOPRAM OXALATE 10 MG PO TABS
10.0000 mg | ORAL_TABLET | Freq: Every day | ORAL | Status: DC
  Administered 2023-05-09 – 2023-05-17 (×9): 10 mg via ORAL
  Filled 2023-05-08 (×9): qty 1

## 2023-05-08 NOTE — ED Notes (Signed)
 Patient transported to X-ray

## 2023-05-08 NOTE — Plan of Care (Signed)

## 2023-05-08 NOTE — ED Notes (Signed)
Patient returned from XR. 

## 2023-05-08 NOTE — ED Triage Notes (Signed)
 Pt BIB [facility] from home after mechanical fall. Per EMS pt fell last night around 8pm by tripping on a rug at home. Pt did not hit her head. She does not take blood thinners. Pt has been on ground until neighbor checked on her this afternoon. Obvious L femur deformity, traction splint placed by EMS. Pt reports pain in R hip, she is able to move it and has good bilateral pedal pulses. fentanyl given by EMS. Aox4, no LOC, R eye is swollen and she is blind in R eye. VSS per EMS.

## 2023-05-08 NOTE — ED Provider Notes (Signed)
 [place] EMERGENCY DEPARTMENT AT [facility] Provider Note   CSN: [id number] Arrival date & time: 05/08/23  1454     History  Chief Complaint  Patient presents with   Sarah Castro is a 83 y.o. female.  HPI   83 year old female presents to the emergency department after mechanical fall complaining of bilateral hip/leg pain.  Patient states last night around 10 PM she was walking around and locking her doors.  When she got to the front door she slipped on the runner and fell down onto her side.  Did not hit her head, there is no loss of consciousness.  Had no form of contact and fortunately spent the night on the floor until neighbor checked on her this morning.  Patient's main complaint is left upper leg/hip pain and deformity as well as right hip pain.  Prior to this fall she is been in her usual state of health with no other acute symptoms.  She denies any headache, facial pain, new neck/back pain, chest/abdominal pain.  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  Home Medications Prior to Admission medications   Medication Sig Start Date End Date Taking? Authorizing Provider  AMBULATORY NON FORMULARY MEDICATION Rolling walker 05/12/20   Judi Saa, DO  calcium carbonate (TUMS - DOSED IN MG ELEMENTAL CALCIUM) 500 MG chewable tablet Chew 1,000 mg by mouth daily as needed for indigestion or heartburn.    [provider]  Calcium Carbonate-Vitamin D (CALCIUM 600 + D PO) Take 1 tablet by mouth 2 (two) times daily.    [provider]  cholecalciferol (VITAMIN D) 400 UNITS TABS Take 400 Units by mouth 2 (two) times daily.    [provider]  cloNIDine (CATAPRES) 0.1 MG tablet Take 0.05-0.1 mg by mouth daily as needed (SBP >180 for 30 days). 09/22/21   [provider]  doxazosin (CARDURA) 8 MG tablet Take 4 mg by mouth 2 (two) times daily. 10/04/21   [provider]  escitalopram (LEXAPRO) 10 MG tablet Take 10 mg by mouth daily.    [provider]  fish oil-omega-3 fatty acids 1000 MG capsule Take 1 g by mouth 2 (two) times daily.    [provider]  furosemide (LASIX) 40 MG tablet Take 40 mg by mouth daily.    [provider]  lovastatin (MEVACOR) 40 MG tablet Take 20 mg by mouth at bedtime.    [provider]  meloxicam (MOBIC) 15 MG tablet Take 1 tablet (15 mg total) by mouth daily. 08/25/22   Cassandria Anger, PA-C  methocarbamol (ROBAXIN) 500 MG tablet Take 1 tablet (500 mg total) by mouth every 6 (six) hours as needed for muscle spasms. 08/25/22   Cassandria Anger, PA-C  metoprolol succinate (TOPROL-XL) 25 MG 24 hr tablet Take 25 mg by mouth every evening.    [provider]  Multiple Vitamins-Minerals (MULTIVITAMIN WITH MINERALS) tablet Take 1 tablet by mouth daily.    [provider]  Multiple Vitamins-Minerals (PRESERVISION AREDS 2) CAPS Take 1 capsule by mouth 2 (two) times daily.    [provider]  olmesartan (BENICAR) 40 MG tablet Take 40 mg by mouth daily.    [provider]  oxyCODONE (OXY IR/ROXICODONE) 5 MG immediate release tablet Take 1 tablet (5 mg total) by mouth every 4 (four) hours as needed for severe pain. 08/25/22  Rosalene Billings R, PA-C  polyethylene glycol (MIRALAX / GLYCOLAX) 17 g packet Take 17 g by mouth 2 (two) times daily. 08/25/22   Cassandria Anger, PA-C  potassium chloride (KLOR-CON M) 10 MEQ tablet Take 10 mEq by mouth daily.    [provider]  Red Yeast Rice Extract 600 MG CAPS Take 600 mg by mouth 2 (two) times daily.    [provider]  TURMERIC PO Take 400 mg by mouth 2 (two) times daily.    [provider]      Allergies    Amlodipine, Garlic, Hydrocodone, and Sulfa antibiotics    Review of Systems   Review of Systems  Constitutional:  Negative for fever.  Eyes:  Negative for visual disturbance.   Respiratory:  Negative for shortness of breath.   Cardiovascular:  Negative for chest pain.  Gastrointestinal:  Negative for abdominal pain, diarrhea and vomiting.  Genitourinary:  Negative for dysuria.  Musculoskeletal:  Negative for back pain and neck pain.       +bilateral hip/femur pain  Skin:  Negative for rash and wound.  Neurological:  Negative for dizziness, weakness and headaches.  Psychiatric/Behavioral:  Negative for confusion.     Physical Exam Updated Vital Signs BP 125/81 (BP Location: Left Arm)   Pulse 72   Temp 98.5 F (36.9 C) (Oral)   Resp 15   Ht 5\' 1"  (1.549 m)   Wt 59.9 kg   SpO2 100%   BMI 24.94 kg/m  Physical Exam Vitals and nursing note reviewed.  Constitutional:      General: She is not in acute distress.    Appearance: Normal appearance.  HENT:     Head: Normocephalic and atraumatic.     Comments: No hematoma noted, no facial tenderness/swelling/bruising    Mouth/Throat:     Mouth: Mucous membranes are moist.  Eyes:     Comments: Left pupil is reactive, right eye is chronically blind with baseline eyelid drooping  Cardiovascular:     Rate and Rhythm: Normal rate.  Pulmonary:     Effort: Pulmonary effort is normal. No respiratory distress.  Abdominal:     Palpations: Abdomen is soft.     Tenderness: There is no abdominal tenderness.  Musculoskeletal:     Cervical back: Normal range of motion and neck supple. No rigidity or tenderness.     Comments: No new midline spinal pain.  Pelvis is stable.  Left hip is edematous, tender to touch, externally rotated, slightly shortened.  Right hip is tender to palpation without significant swelling/bruising, equal DP pulses, neuro intact  Skin:    General: Skin is warm.  Neurological:     Mental Status: She is alert and oriented to person, place, and time. Mental status is at baseline.  Psychiatric:        Mood and Affect: Mood normal.     ED Results / Procedures / Treatments   Labs (all labs  ordered are listed, but only abnormal results are displayed) Labs Reviewed  CBC WITH DIFFERENTIAL/PLATELET  BASIC METABOLIC PANEL WITH GFR    EKG None  Radiology No results found.  Procedures Procedures    Medications Ordered in ED Medications  fentaNYL (SUBLIMAZE) injection 100 mcg (has no administration in time range)    ED Course/ Medical Decision Making/ A&P  Medical Decision Making Amount and/or Complexity of Data Reviewed Labs: ordered. Radiology: ordered.  Risk Prescription drug management. Decision regarding hospitalization.   83 year old female presents emergency department mechanical fall last night, down on the ground overnight.  Now here with bilateral hip pain, worse on the left.  No other acute medical complaints.  Vitals are normal and stable.  Blood work is around baseline.  X-ray imaging shows stable pelvis but fracture of the left proximal femur diaphysis extending to the greater trochanter.  Consulted with on-call orthopedic surgeon, Dr. Hulda Humphrey, recommends n.p.o. at midnight and plan for surgical care tomorrow.  Patients evaluation and results requires admission for further treatment and care.  Spoke with hospitalist, reviewed patient's ED course and they accept admission.  Patient agrees with admission plan, offers no new complaints and is stable/unchanged at time of admit.        Final Clinical Impression(s) / ED Diagnoses Final diagnoses:  None    Rx / DC Orders ED Discharge Orders     None         Rozelle Logan, DO 05/08/23 1929

## 2023-05-08 NOTE — H&P (Signed)
 History and Physical    BRANTLEY NASER QMV:[id number] DOB: 1941/01/22 DOA: 05/08/2023  PCP: Georgann Housekeeper, MD   Patient coming from: Home   Chief Complaint:  Chief Complaint  Patient presents with   Fall    HPI:  Sarah Castro is a 83 y.o. female with hx of prior CVA, hypertension, hyperlipidemia, wet AMD, blind OD, mood disorder, who was brought in after ground-level fall.  Reports that yesterday she was setting up her house before going to bed and she tripped on a rug runner front door she fell towards her left side and ended up landing partially on a table with her left leg.  Does not think she hit her head although she had some minor swelling under her right eye which thinks may be from laying on this side with glasses but she gets it on the floor.  Was on the ground overnight until neighbor checked on her this morning.  Denies any other sites of pain other than the left thigh.  She is not on anticoagulants or antiplatelets.  No syncope, chest pain, palpitations.  No other recent illness.   Review of Systems:  ROS complete and negative except as marked above   Allergies  Allergen Reactions   Amlodipine Swelling   Garlic Diarrhea   Hydrocodone Nausea And Vomiting   Sulfa Antibiotics Rash    Prior to Admission medications   Medication Sig Start Date End Date Taking? Authorizing Provider  AMBULATORY NON FORMULARY MEDICATION Rolling walker 05/12/20   Judi Saa, DO  calcium carbonate (TUMS - DOSED IN MG ELEMENTAL CALCIUM) 500 MG chewable tablet Chew 1,000 mg by mouth daily as needed for indigestion or heartburn.    [provider]  Calcium Carbonate-Vitamin D (CALCIUM 600 + D PO) Take 1 tablet by mouth 2 (two) times daily.    [provider]  cholecalciferol (VITAMIN D) 400 UNITS TABS Take 400 Units by mouth 2 (two) times daily.    [provider]  cloNIDine (CATAPRES) 0.1 MG tablet Take 0.05-0.1 mg by mouth daily as needed (SBP >180 for 30  days). 09/22/21   [provider]  doxazosin (CARDURA) 8 MG tablet Take 4 mg by mouth 2 (two) times daily. 10/04/21   [provider]  escitalopram (LEXAPRO) 10 MG tablet Take 10 mg by mouth daily.    [provider]  fish oil-omega-3 fatty acids 1000 MG capsule Take 1 g by mouth 2 (two) times daily.    [provider]  furosemide (LASIX) 40 MG tablet Take 40 mg by mouth daily.    [provider]  lovastatin (MEVACOR) 40 MG tablet Take 20 mg by mouth at bedtime.    [provider]  meloxicam (MOBIC) 15 MG tablet Take 1 tablet (15 mg total) by mouth daily. 08/25/22   Cassandria Anger, PA-C  methocarbamol (ROBAXIN) 500 MG tablet Take 1 tablet (500 mg total) by mouth every 6 (six) hours as needed for muscle spasms. 08/25/22   Cassandria Anger, PA-C  metoprolol succinate (TOPROL-XL) 25 MG 24 hr tablet Take 25 mg by mouth every evening.    [provider]  Multiple Vitamins-Minerals (MULTIVITAMIN WITH MINERALS) tablet Take 1 tablet by mouth daily.    [provider]  Multiple Vitamins-Minerals (PRESERVISION AREDS 2) CAPS Take 1 capsule by mouth 2 (two) times daily.    [provider]  olmesartan (BENICAR) 40 MG tablet Take 40 mg by mouth daily.    [provider]  oxyCODONE (OXY IR/ROXICODONE) 5 MG immediate release tablet Take 1 tablet (5 mg total) by mouth every 4 (four) hours as needed for severe pain. 08/25/22   Cassandria Anger, PA-C  polyethylene glycol (MIRALAX / GLYCOLAX) 17 g packet Take 17 g by mouth 2 (two) times daily. 08/25/22   Cassandria Anger, PA-C  potassium chloride (KLOR-CON M) 10 MEQ tablet Take 10 mEq by mouth daily.    [provider]  Red Yeast Rice Extract 600 MG CAPS Take 600 mg by mouth 2 (two) times daily.    [provider]  TURMERIC PO Take 400 mg by mouth 2 (two) times daily.    [provider]    Past Medical History:  Diagnosis Date   Arthritis     Depression with anxiety    Hypercholesteremia    Hypertension    Macular degeneration    PONV (postoperative nausea and vomiting)    Pre-diabetes    Stroke [facility])     Past Surgical History:  Procedure Laterality Date   APPENDECTOMY     broken back     BUNIONECTOMY  2013   CATARACT EXTRACTION     right   HAMMER TOE SURGERY  2013   HEMATOMA EVACUATION  5 years ago   IR RADIOLOGIST EVAL & MGMT  12/04/2019   KNEE ARTHROSCOPY     right   LAPAROSCOPIC APPENDECTOMY N/A 10/24/2012   Procedure: APPENDECTOMY LAPAROSCOPIC;  Surgeon: Emelia Loron, MD;  Location: [facility] OR;  Service: General;  Laterality: N/A;   LAPAROSCOPIC APPENDECTOMY     POSTERIOR TIBIAL TENDON REPAIR  1998   REPLACEMENT TOTAL KNEE Right    TONSILLECTOMY   age 55   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   Procedure: TOTAL KNEE ARTHROPLASTY;  Surgeon: [name], MD;  Location: [facility] ORS;  Service: Orthopedics;  Laterality: Right;   TOTAL KNEE ARTHROPLASTY Left 08/24/2022   Procedure: TOTAL KNEE ARTHROPLASTY;  Surgeon: Durene Romans, MD;  Location: [facility] ORS;  Service: Orthopedics;  Laterality: Left;     reports that she quit smoking about 12 years ago. Her smoking use included cigarettes. She started smoking about 32 years ago. She has a 10 pack-year smoking history. She has never used smokeless tobacco. She reports current alcohol use. She reports that she does not use drugs.  Family History  Problem Relation Age of Onset   Hypertension Mother    Hypertension Father    Cancer Father        throat   Throat cancer Father    Cancer Sister        non hodgkins lymphoma   Non-Hodgkin's lymphoma Sister    Non-Hodgkin's lymphoma Brother    Colon cancer Maternal Grandmother 49   Prostate cancer Maternal Uncle      Physical Exam: Vitals:   05/08/23 1511 05/08/23 1801 05/08/23 1853 05/08/23 2015  BP:  110/65  121/73  Pulse:  92  77  Resp:  12  (!) 24  Temp:   98.8 F (37.1 C)   TempSrc:   Oral   SpO2:  100%  100%   Weight: 59.9 kg     Height: 5\' 1"  (1.549 m)       Gen: Awake, alert, elderly, frail HEENT: Head atraumatic.  Corneal opacity OD chronic CV: Regular, normal S1, S2, 1/6 SEM, nonpalpable pedal pulses Resp: Normal WOB, CTAB  Abd: Flat, normoactive, nontender MSK: Chronic deformity at the right elbow.  LLE is shortened and internally rotated, no LE  edema  Skin: No rashes or lesions to exposed skin  Neuro: Alert and interactive, blind OD chronic.  No focal deficit Psych: euthymic, appropriate    Data review:   Labs reviewed, notable for:   K5 Creatinine 0.8 slightly up from prior Hemoglobin 9, normocytic  Micro:  Results for orders placed or performed during the hospital encounter of 08/14/22  Surgical pcr screen     Status: None   Collection Time: 08/14/22 11:54 AM   Specimen: Nasal Mucosa; Nasal Swab  Result Value Ref Range Status   MRSA, PCR NEGATIVE NEGATIVE Final   Staphylococcus aureus NEGATIVE NEGATIVE Final    Comment: (NOTE) The Xpert SA Assay (FDA approved for NASAL specimens in patients 79 years of age and older), is one component of a comprehensive surveillance program. It is not intended to diagnose infection nor to guide or monitor treatment. Performed at [facility], [address] [place], [place] [address]     Imaging reviewed:  DG Femur Min 2 Views Left Result Date: 05/08/2023 CLINICAL DATA:  Status post fall with leg deformity EXAM: LEFT FEMUR 2 VIEWS; PELVIS - 1 VIEW COMPARISON:  Left hip radiograph dated 05/12/2020 FINDINGS: Oblique fracture of the proximal left femoral diaphysis extending to the greater trochanter with 2 shaft width posterior displacement and 3.2 cm foreshortening. No acute pelvic fracture or diastasis. Degenerative changes of the bilateral hips. Old right inferior pubic ramus fracture. Left knee arthroplasty hardware appears intact. Soft tissues are unremarkable. IMPRESSION: Displaced oblique fracture of the proximal  left femoral diaphysis extending to the greater trochanter. Electronically Signed   By: Agustin Cree M.D.   On: 05/08/2023 18:37   DG Pelvis 1-2 Views Result Date: 05/08/2023 CLINICAL DATA:  Status post fall with leg deformity EXAM: LEFT FEMUR 2 VIEWS; PELVIS - 1 VIEW COMPARISON:  Left hip radiograph dated 05/12/2020 FINDINGS: Oblique fracture of the proximal left femoral diaphysis extending to the greater trochanter with 2 shaft width posterior displacement and 3.2 cm foreshortening. No acute pelvic fracture or diastasis. Degenerative changes of the bilateral hips. Old right inferior pubic ramus fracture. Left knee arthroplasty hardware appears intact. Soft tissues are unremarkable. IMPRESSION: Displaced oblique fracture of the proximal left femoral diaphysis extending to the greater trochanter. Electronically Signed   By: Agustin Cree M.D.   On: 05/08/2023 18:37   DG Femur Min 2 Views Right Result Date: 05/08/2023 CLINICAL DATA:  Status post fall EXAM: RIGHT FEMUR 2 VIEWS COMPARISON:  Radiograph of the pelvis and left hip dated 05/08/2020 FINDINGS: There is no evidence of acute displaced fracture. Old inferior right pubic ramus fracture. Soft tissues are unremarkable. Vascular calcifications. Right knee arthroplasty hardware appears intact and well seated. IMPRESSION: No acute displaced right femoral fracture. Old inferior right pubic ramus fracture. Electronically Signed   By: Agustin Cree M.D.   On: 05/08/2023 18:30    EKG:  Personally reviewed sinus rhythm with first-degree AV block, LAD, LAE, no acute ischemic changes.  ED Course:  Imaging obtained per above revealing left femoral diaphysis fracture.  Case was discussed with orthopedics Dr. Hulda Humphrey with plan for OR tomorrow    Assessment/Plan:  83 y.o. female with hx prior CVA, hypertension, hyperlipidemia, wet AMD, blind OD, mood disorder, who was brought in after ground-level fall c/b L femoral shaft fracture.   Left femoral shaft fracture  fracture Clinical diagnosis osteoporosis Ground-level fall -Orthopedic surgery consulted, tentative plan for OR tomorrow -N.p.o. after midnight -DVT prophylaxis to be readdressed postop; SCD for  now  -Tele monitoring periop -PT/ OT eval postop ordered   -Pain mgmt: Tylenol prn for mild, oxycodone 2.5/5 mg p.o. every 4 hours prn for moderate/severe, Dilaudid 0.5 mg IV every 4 hours prn for breakthrough -Check vitamin D, start vitamin D3 1000 IU daily -Will need follow-up with PCP for further treatment of osteoporosis  Anemia, normocytic Hemoglobin 9 on admission.  Reports history of IDA although trouble tolerating iron due to constipation. - Check iron panel, B12, folate - Transfuse for hemoglobin less than 8 preop  Borderline hyperkalemia Minimal elevation of creatinine baseline near 0.6, elevated to 0.8 on admission.  K 5.  With prolonged downtime hyperkalemia may be related to rhabdomyolysis, will eval as below  - Check CK with prolonged downtime - Give 500 cc normal saline, additional fluids per CK result - Repeat in a.m. if worsening would start Lokelma  First-degree AV block Incidentally noted on EKG -Okay to continue metoprolol preop  Chronic medical problems: History of prior CVA: Notes this was incidentally noted on prior imaging.  Not on aspirin.  Sub home statin Hypertension: BP low normal in the ED.  Home regimen is metoprolol 25 mg daily, olmesartan 40 mg daily, doxazosin 8 mg nightly, clonidine 0.1 mg as needed for SBP below 180.  For now hold home antihypertensives with exception of metoprolol Hyperlipidemia: Sub pravastatin for home lovastatin. Wet AMD, blind OD (sequela of prior endophthalmitis): Outpatient follow-up Mood disorder: Continue home escitalopram  Body mass index is 24.94 kg/m.    DVT prophylaxis:  SCDs Code Status:  Full Code Diet:  Diet Orders (From admission, onward)     Start     Ordered   05/09/23 0001  Diet NPO time specified Except for:  Sips with Meds  Diet effective midnight       Question:  Except for  Answer:  Clearance Coots with Meds   05/08/23 1953   05/08/23 1952  Diet Heart Room service appropriate? Yes; Fluid consistency: Thin  Diet effective now       Question Answer Comment  Room service appropriate? Yes   Fluid consistency: Thin      05/08/23 1953           Family Communication:  Yes discussed with daughter at bedside Consults: Orthopedics Admission status:   Inpatient, Telemetry bed  Severity of Illness: The appropriate patient status for this patient is INPATIENT. Inpatient status is judged to be reasonable and necessary in order to provide the required intensity of service to ensure the patient's safety. The patient's presenting symptoms, physical exam findings, and initial radiographic and laboratory data in the context of their chronic comorbidities is felt to place them at high risk for further clinical deterioration. Furthermore, it is not anticipated that the patient will be medically stable for discharge from the hospital within 2 midnights of admission.   * I certify that at the point of admission it is my clinical judgment that the patient will require inpatient hospital care spanning beyond 2 midnights from the point of admission due to high intensity of service, high risk for further deterioration and high frequency of surveillance required.*   Dolly Rias, MD [facility]  How to contact the [facility] Attending or Consulting provider 7A - 7P or covering provider during after hours 7P -7A, for this patient.  Check the care team in [facility] and look for a) attending/consulting [facility] provider listed and b) the [facility] team listed Log into [email or web address] and use [facility] universal password to access. If  you do not have the password, please contact the hospital operator. Locate the [facility] provider you are looking for under [facility] and page to a number that you can be directly reached. If you still  have difficulty reaching the provider, please page the [facility] (Director on Call) for the Hospitalists listed on amion for assistance.  05/08/2023, 8:20 PM

## 2023-05-08 NOTE — ED Notes (Signed)
 MD at bedside.

## 2023-05-09 ENCOUNTER — Encounter (HOSPITAL_COMMUNITY)
Admission: EM | Disposition: A | Discharge status: Skilled Nursing Facility | Payer: Self-pay | Source: Home / Self Care | Attending: Internal Medicine

## 2023-05-09 ENCOUNTER — Other Ambulatory Visit: Payer: Self-pay

## 2023-05-09 ENCOUNTER — Inpatient Hospital Stay (HOSPITAL_COMMUNITY)

## 2023-05-09 ENCOUNTER — Inpatient Hospital Stay (HOSPITAL_COMMUNITY): Admitting: Anesthesiology

## 2023-05-09 ENCOUNTER — Encounter (HOSPITAL_COMMUNITY)

## 2023-05-09 ENCOUNTER — Encounter (HOSPITAL_COMMUNITY): Payer: Self-pay | Admitting: Internal Medicine

## 2023-05-09 DIAGNOSIS — W1830XA Fall on same level, unspecified, initial encounter: Secondary | ICD-10-CM | POA: Diagnosis not present

## 2023-05-09 DIAGNOSIS — S72142A Displaced intertrochanteric fracture of left femur, initial encounter for closed fracture: Secondary | ICD-10-CM

## 2023-05-09 DIAGNOSIS — D649 Anemia, unspecified: Secondary | ICD-10-CM | POA: Diagnosis not present

## 2023-05-09 DIAGNOSIS — S72332A Displaced oblique fracture of shaft of left femur, initial encounter for closed fracture: Secondary | ICD-10-CM | POA: Diagnosis not present

## 2023-05-09 HISTORY — PX: INTRAMEDULLARY (IM) NAIL INTERTROCHANTERIC: SHX5875

## 2023-05-09 LAB — CBC
HCT: 25.2 % — ABNORMAL LOW (ref 36.0–46.0)
HCT: 17.9 % — ABNORMAL LOW (ref 36.0–46.0)
Hemoglobin: 8.7 g/dL — ABNORMAL LOW (ref 12.0–15.0)
Hemoglobin: 5.8 g/dL — CL (ref 12.0–15.0)
MCH: 31.9 pg (ref 26.0–34.0)
MCH: 31.4 pg (ref 26.0–34.0)
MCHC: 34.5 g/dL (ref 30.0–36.0)
MCHC: 32.4 g/dL (ref 30.0–36.0)
MCV: 92.3 fL (ref 80.0–100.0)
MCV: 96.8 fL (ref 80.0–100.0)
Platelets: 167 10*3/uL (ref 150–400)
Platelets: 115 10*3/uL — ABNORMAL LOW (ref 150–400)
RBC: 2.73 MIL/uL — ABNORMAL LOW (ref 3.87–5.11)
RBC: 1.85 MIL/uL — ABNORMAL LOW (ref 3.87–5.11)
RDW: 13.2 % (ref 11.5–15.5)
RDW: 13.2 % (ref 11.5–15.5)
WBC: 7.3 10*3/uL (ref 4.0–10.5)
WBC: 7 10*3/uL (ref 4.0–10.5)
nRBC: 0 % (ref 0.0–0.2)
nRBC: 0 % (ref 0.0–0.2)

## 2023-05-09 LAB — BASIC METABOLIC PANEL WITH GFR
Anion gap: 5 (ref 5–15)
BUN: 30 mg/dL — ABNORMAL HIGH (ref 8–23)
CO2: 32 mmol/L (ref 22–32)
Calcium: 8.9 mg/dL (ref 8.9–10.3)
Chloride: 99 mmol/L (ref 98–111)
Creatinine, Ser: 0.9 mg/dL (ref 0.44–1.00)
GFR, Estimated: >60 mL/min (ref >60)
Glucose, Bld: 113 mg/dL — ABNORMAL HIGH (ref 70–99)
Potassium: 4.3 mmol/L (ref 3.5–5.1)
Sodium: 136 mmol/L (ref 135–145)

## 2023-05-09 LAB — FERRITIN: Ferritin: 103 ng/mL (ref 11–307)

## 2023-05-09 LAB — HEMOGLOBIN AND HEMATOCRIT, BLOOD
HCT: 21.6 % — ABNORMAL LOW (ref 36.0–46.0)
Hemoglobin: 7 g/dL — ABNORMAL LOW (ref 12.0–15.0)

## 2023-05-09 LAB — CREATININE, SERUM
Creatinine, Ser: 0.65 mg/dL (ref 0.44–1.00)
GFR, Estimated: >60 mL/min (ref >60)

## 2023-05-09 LAB — IRON AND TIBC
Iron: 87 ug/dL (ref 28–170)
Saturation Ratios: 29 % (ref 10.4–31.8)
TIBC: 305 ug/dL (ref 250–450)
UIBC: 218 ug/dL

## 2023-05-09 LAB — FOLATE: Folate: 36 ng/mL (ref >5.9)

## 2023-05-09 LAB — VITAMIN D 25 HYDROXY (VIT D DEFICIENCY, FRACTURES): Vit D, 25-Hydroxy: 86.55 ng/mL (ref 30–100)

## 2023-05-09 LAB — PROTIME-INR
INR: 1 (ref 0.8–1.2)
Prothrombin Time: 13.4 s (ref 11.4–15.2)

## 2023-05-09 LAB — VITAMIN B12: Vitamin B-12: 457 pg/mL (ref 180–914)

## 2023-05-09 LAB — PHOSPHORUS: Phosphorus: 3.8 mg/dL (ref 2.5–4.6)

## 2023-05-09 LAB — MRSA NEXT GEN BY PCR, NASAL: MRSA by PCR Next Gen: NOT DETECTED

## 2023-05-09 LAB — MAGNESIUM: Magnesium: 2.1 mg/dL (ref 1.7–2.4)

## 2023-05-09 SURGERY — FIXATION, FRACTURE, INTERTROCHANTERIC, WITH INTRAMEDULLARY ROD
Anesthesia: General | Laterality: Left

## 2023-05-09 MED ORDER — FENTANYL CITRATE (PF) 100 MCG/2ML IJ SOLN
INTRAMUSCULAR | Status: AC
  Filled 2023-05-09: qty 2

## 2023-05-09 MED ORDER — FENTANYL CITRATE (PF) 100 MCG/2ML IJ SOLN
25.0000 ug | INTRAMUSCULAR | Status: DC | PRN
  Administered 2023-05-09: 50 ug via INTRAVENOUS

## 2023-05-09 MED ORDER — ACETAMINOPHEN 10 MG/ML IV SOLN
INTRAVENOUS | Status: DC | PRN
Start: 2023-05-09 — End: 2023-05-09
  Administered 2023-05-09: 1000 mg via INTRAVENOUS

## 2023-05-09 MED ORDER — ORAL CARE MOUTH RINSE: 15.0000 mL | Freq: Once | OROMUCOSAL | Status: AC

## 2023-05-09 MED ORDER — CEFAZOLIN SODIUM-DEXTROSE 2-4 GM/100ML-% IV SOLN
2.0000 g | INTRAVENOUS | Status: AC
  Administered 2023-05-09: 2 g via INTRAVENOUS
  Filled 2023-05-09: qty 100

## 2023-05-09 MED ORDER — GLYCOPYRROLATE 0.2 MG/ML IJ SOLN
INTRAMUSCULAR | Status: DC | PRN
  Administered 2023-05-09 (×2): .1 mg via INTRAVENOUS

## 2023-05-09 MED ORDER — EPHEDRINE 5 MG/ML INJ
INTRAVENOUS | Status: AC
  Filled 2023-05-09: qty 5

## 2023-05-09 MED ORDER — PROPOFOL 10 MG/ML IV BOLUS
INTRAVENOUS | Status: DC | PRN
  Administered 2023-05-09: 80 mg via INTRAVENOUS

## 2023-05-09 MED ORDER — CHLORHEXIDINE GLUCONATE 4 % EX SOLN
60.0000 mL | Freq: Once | CUTANEOUS | Status: AC
  Administered 2023-05-09: 4 via TOPICAL
  Filled 2023-05-09: qty 60

## 2023-05-09 MED ORDER — ROCURONIUM BROMIDE 10 MG/ML (PF) SYRINGE
PREFILLED_SYRINGE | INTRAVENOUS | Status: AC
  Filled 2023-05-09: qty 20

## 2023-05-09 MED ORDER — ONDANSETRON HCL 4 MG/2ML IJ SOLN
INTRAMUSCULAR | Status: DC | PRN
  Administered 2023-05-09: 4 mg via INTRAVENOUS

## 2023-05-09 MED ORDER — DEXAMETHASONE SODIUM PHOSPHATE 10 MG/ML IJ SOLN
INTRAMUSCULAR | Status: AC
  Filled 2023-05-09: qty 2

## 2023-05-09 MED ORDER — LACTATED RINGERS IV SOLN: INTRAVENOUS | Status: DC

## 2023-05-09 MED ORDER — LIDOCAINE 2% (20 MG/ML) 5 ML SYRINGE
INTRAMUSCULAR | Status: DC | PRN
  Administered 2023-05-09: 100 mg via INTRAVENOUS

## 2023-05-09 MED ORDER — POVIDONE-IODINE 10 % EX SWAB
2.0000 | Freq: Once | CUTANEOUS | Status: AC
  Administered 2023-05-09: 2 via TOPICAL

## 2023-05-09 MED ORDER — LABETALOL HCL 5 MG/ML IV SOLN
INTRAVENOUS | Status: AC
  Filled 2023-05-09: qty 4

## 2023-05-09 MED ORDER — METOCLOPRAMIDE HCL 5 MG PO TABS: 5.0000 mg | ORAL_TABLET | Freq: Three times a day (TID) | ORAL | Status: DC | PRN

## 2023-05-09 MED ORDER — GLYCOPYRROLATE PF 0.2 MG/ML IJ SOSY
PREFILLED_SYRINGE | INTRAMUSCULAR | Status: AC
  Filled 2023-05-09: qty 1

## 2023-05-09 MED ORDER — DEXAMETHASONE SODIUM PHOSPHATE 10 MG/ML IJ SOLN
INTRAMUSCULAR | Status: DC | PRN
  Administered 2023-05-09: 8 mg via INTRAVENOUS

## 2023-05-09 MED ORDER — FENTANYL CITRATE (PF) 250 MCG/5ML IJ SOLN
INTRAMUSCULAR | Status: AC
  Filled 2023-05-09: qty 5

## 2023-05-09 MED ORDER — LIDOCAINE 2% (20 MG/ML) 5 ML SYRINGE
INTRAMUSCULAR | Status: AC
  Filled 2023-05-09: qty 10

## 2023-05-09 MED ORDER — ONDANSETRON HCL 4 MG/2ML IJ SOLN
INTRAMUSCULAR | Status: AC
  Filled 2023-05-09: qty 4

## 2023-05-09 MED ORDER — CHLORHEXIDINE GLUCONATE 0.12 % MT SOLN
15.0000 mL | Freq: Once | OROMUCOSAL | Status: AC
  Administered 2023-05-09: 15 mL via OROMUCOSAL
  Filled 2023-05-09: qty 15

## 2023-05-09 MED ORDER — PHENYLEPHRINE 80 MCG/ML (10ML) SYRINGE FOR IV PUSH (FOR BLOOD PRESSURE SUPPORT)
PREFILLED_SYRINGE | INTRAVENOUS | Status: DC | PRN
Start: 2023-05-09 — End: 2023-05-09
  Administered 2023-05-09: 80 ug via INTRAVENOUS

## 2023-05-09 MED ORDER — OXYCODONE HCL 5 MG PO TABS: 5.0000 mg | ORAL_TABLET | Freq: Once | ORAL | Status: DC | PRN

## 2023-05-09 MED ORDER — CEFAZOLIN SODIUM-DEXTROSE 2-4 GM/100ML-% IV SOLN
2.0000 g | Freq: Three times a day (TID) | INTRAVENOUS | Status: AC
  Administered 2023-05-09 – 2023-05-10 (×3): 2 g via INTRAVENOUS
  Filled 2023-05-09 (×3): qty 100

## 2023-05-09 MED ORDER — ROCURONIUM BROMIDE 10 MG/ML (PF) SYRINGE
PREFILLED_SYRINGE | INTRAVENOUS | Status: DC | PRN
  Administered 2023-05-09: 50 mg via INTRAVENOUS

## 2023-05-09 MED ORDER — TRANEXAMIC ACID-NACL 1000-0.7 MG/100ML-% IV SOLN
1000.0000 mg | Freq: Once | INTRAVENOUS | Status: AC
  Administered 2023-05-09: 1000 mg via INTRAVENOUS
  Filled 2023-05-09: qty 100

## 2023-05-09 MED ORDER — LACTATED RINGERS IV SOLN: INTRAVENOUS | Status: DC | PRN

## 2023-05-09 MED ORDER — METOCLOPRAMIDE HCL 5 MG/ML IJ SOLN: 5.0000 mg | Freq: Three times a day (TID) | INTRAMUSCULAR | Status: DC | PRN

## 2023-05-09 MED ORDER — DIPHENHYDRAMINE HCL 25 MG PO CAPS
50.0000 mg | ORAL_CAPSULE | Freq: Once | ORAL | Status: AC
  Administered 2023-05-14: 50 mg via ORAL
  Filled 2023-05-09: qty 2

## 2023-05-09 MED ORDER — ALBUMIN HUMAN 5 % IV SOLN: INTRAVENOUS | Status: DC | PRN

## 2023-05-09 MED ORDER — OXYCODONE HCL 5 MG/5ML PO SOLN: 5.0000 mg | Freq: Once | ORAL | Status: DC | PRN

## 2023-05-09 MED ORDER — SUGAMMADEX SODIUM 200 MG/2ML IV SOLN
INTRAVENOUS | Status: DC | PRN
  Administered 2023-05-09: 50 mg via INTRAVENOUS
  Administered 2023-05-09: 150 mg via INTRAVENOUS

## 2023-05-09 MED ORDER — FENTANYL CITRATE (PF) 250 MCG/5ML IJ SOLN
INTRAMUSCULAR | Status: DC | PRN
  Administered 2023-05-09: 25 ug via INTRAVENOUS
  Administered 2023-05-09: 50 ug via INTRAVENOUS
  Administered 2023-05-09: 25 ug via INTRAVENOUS

## 2023-05-09 MED ORDER — 0.9 % SODIUM CHLORIDE (POUR BTL) OPTIME
TOPICAL | Status: DC | PRN
  Administered 2023-05-09: 1000 mL

## 2023-05-09 MED ORDER — DOCUSATE SODIUM 100 MG PO CAPS
100.0000 mg | ORAL_CAPSULE | Freq: Two times a day (BID) | ORAL | Status: DC
  Administered 2023-05-09: 100 mg via ORAL
  Filled 2023-05-09: qty 1

## 2023-05-09 MED ORDER — ENOXAPARIN SODIUM 40 MG/0.4ML IJ SOSY
40.0000 mg | PREFILLED_SYRINGE | INTRAMUSCULAR | Status: DC
  Administered 2023-05-10 – 2023-05-17 (×8): 40 mg via SUBCUTANEOUS
  Filled 2023-05-09 (×8): qty 0.4

## 2023-05-09 MED ORDER — SODIUM CHLORIDE 0.9 % IV SOLN: INTRAVENOUS | Status: AC

## 2023-05-09 SURGICAL SUPPLY — 44 items
BAG COUNTER SPONGE SURGICOUNT (BAG) IMPLANT
BIT DRILL INTERTAN LAG SCREW (BIT) IMPLANT
BIT DRILL SHORT 4.0 (BIT) IMPLANT
BRUSH SCRUB EZ PLAIN DRY (MISCELLANEOUS) ×4 IMPLANT
CHLORAPREP W/TINT 26 (MISCELLANEOUS) ×2 IMPLANT
COVER PERINEAL POST (MISCELLANEOUS) ×2 IMPLANT
COVER SURGICAL LIGHT HANDLE (MISCELLANEOUS) ×2 IMPLANT
DERMABOND ADVANCED .7 DNX12 (GAUZE/BANDAGES/DRESSINGS) ×2 IMPLANT
DRAPE C-ARM 35X43 STRL (DRAPES) ×2 IMPLANT
DRAPE IMP U-DRAPE 54X76 (DRAPES) ×4 IMPLANT
DRAPE INCISE IOBAN 66X45 STRL (DRAPES) ×2 IMPLANT
DRAPE STERI IOBAN 125X83 (DRAPES) ×2 IMPLANT
DRAPE SURG 17X23 STRL (DRAPES) ×4 IMPLANT
DRAPE U-SHAPE 47X51 STRL (DRAPES) ×2 IMPLANT
DRESSING MEPILEX FLEX 4X4 (GAUZE/BANDAGES/DRESSINGS) ×2 IMPLANT
DRSG MEPILEX FLEX 4X4 (GAUZE/BANDAGES/DRESSINGS) ×2 IMPLANT
DRSG MEPILEX POST OP 4X8 (GAUZE/BANDAGES/DRESSINGS) ×2 IMPLANT
ELECT REM PT RETURN 9FT ADLT (ELECTROSURGICAL) ×1 IMPLANT
ELECTRODE REM PT RTRN 9FT ADLT (ELECTROSURGICAL) ×2 IMPLANT
GLOVE BIO SURGEON STRL SZ 6.5 (GLOVE) ×6 IMPLANT
GLOVE BIO SURGEON STRL SZ7.5 (GLOVE) ×8 IMPLANT
GLOVE BIOGEL PI IND STRL 6.5 (GLOVE) ×2 IMPLANT
GLOVE BIOGEL PI IND STRL 7.5 (GLOVE) ×2 IMPLANT
GOWN STRL REUS W/ TWL LRG LVL3 (GOWN DISPOSABLE) ×2 IMPLANT
GUIDE PIN 3.2X343 (PIN) ×2 IMPLANT
GUIDE ROD 3.0 (MISCELLANEOUS) ×1 IMPLANT
KIT BASIN OR (CUSTOM PROCEDURE TRAY) ×2 IMPLANT
KIT TURNOVER KIT B (KITS) ×2 IMPLANT
MANIFOLD NEPTUNE II (INSTRUMENTS) ×2 IMPLANT
NAIL LOCK CANN 10X360 130D LT (Nail) IMPLANT
NS IRRIG 1000ML POUR BTL (IV SOLUTION) ×2 IMPLANT
PACK GENERAL/GYN (CUSTOM PROCEDURE TRAY) ×2 IMPLANT
PAD ARMBOARD POSITIONER FOAM (MISCELLANEOUS) ×4 IMPLANT
PIN GUIDE 3.2X343MM (PIN) IMPLANT
ROD GUIDE 3.0 (MISCELLANEOUS) IMPLANT
SCREW LAG COMPR KIT 100/95 (Screw) IMPLANT
SCREW LAG COMPR KIT 110/105 (Screw) IMPLANT
SCREW TRIGEN LOW PROF 5.0X40 (Screw) IMPLANT
SUT MNCRL AB 3-0 PS2 18 (SUTURE) ×2 IMPLANT
SUT MON AB 2-0 CT1 36 (SUTURE) IMPLANT
SUT VIC AB 0 CT1 27XBRD ANBCTR (SUTURE) IMPLANT
SUT VIC AB 2-0 CT1 TAPERPNT 27 (SUTURE) ×4 IMPLANT
TOWEL GREEN STERILE (TOWEL DISPOSABLE) ×4 IMPLANT
WATER STERILE IRR 1000ML POUR (IV SOLUTION) ×2 IMPLANT

## 2023-05-09 NOTE — Anesthesia Postprocedure Evaluation (Signed)
 Anesthesia Post Note  Patient: Sarah Castro  Procedure(s) Performed: FIXATION, FRACTURE, INTERTROCHANTERIC, WITH INTRAMEDULLARY ROD (Left)     Patient location during evaluation: PACU Anesthesia Type: General Level of consciousness: awake Pain management: pain level controlled Vital Signs Assessment: post-procedure vital signs reviewed and stable Respiratory status: spontaneous breathing, nonlabored ventilation and respiratory function stable Cardiovascular status: blood pressure returned to baseline and stable Postop Assessment: no apparent nausea or vomiting Anesthetic complications: no   No notable events documented.  Last Vitals:  Vitals:   05/09/23 1730 05/09/23 1757  BP: (!) 138/47 (!) 133/54  Pulse: 91 93  Resp: 13 18  Temp: 36.7 C (!) 36.3 C  SpO2: 98% 100%    Last Pain:  Vitals:   05/09/23 2000  TempSrc:   PainSc: 0-No pain                 Linton Rump

## 2023-05-09 NOTE — Interval H&P Note (Signed)
 History and Physical Interval Note:  05/09/2023 3:00 PM  Sarah Castro  has presented today for surgery, with the diagnosis of Left intertrochanteric femur fracture.  The various methods of treatment have been discussed with the patient and family. After consideration of risks, benefits and other options for treatment, the patient has consented to  Procedure(s): FIXATION, FRACTURE, INTERTROCHANTERIC, WITH INTRAMEDULLARY ROD (Left) as a surgical intervention.  The patient's history has been reviewed, patient examined, no change in status, stable for surgery.  I have reviewed the patient's chart and labs.  Questions were answered to the patient's satisfaction.     Caryn Bee P Barkley Kratochvil

## 2023-05-09 NOTE — Consult Note (Signed)
 Reason for Consult:Left hip fx Referring Physician: Carma Leaven Time called: [id number] Time at bedside: 0914   Sarah Castro is an 83 y.o. female.  HPI: Nuria fell at home when she tripped on a rug. She had immediate left hip pain and could not get up. She was brought to the ED where x-rays showed a left hip fx and orthopedic surgery was consulted. She lives at home alone and uses a cane or RW for ambulation.  Past Medical History:  Diagnosis Date   Arthritis    Depression with anxiety    Hypercholesteremia    Hypertension    Macular degeneration    PONV (postoperative nausea and vomiting)    Pre-diabetes    Stroke [facility])     Past Surgical History:  Procedure Laterality Date   APPENDECTOMY     broken back     BUNIONECTOMY  2013   CATARACT EXTRACTION     right   HAMMER TOE SURGERY  2013   HEMATOMA EVACUATION  5 years ago   IR RADIOLOGIST EVAL & MGMT  12/04/2019   KNEE ARTHROSCOPY     right   LAPAROSCOPIC APPENDECTOMY N/A 10/24/2012   Procedure: APPENDECTOMY LAPAROSCOPIC;  Surgeon: Emelia Loron, MD;  Location: [facility] OR;  Service: General;  Laterality: N/A;   LAPAROSCOPIC APPENDECTOMY     POSTERIOR TIBIAL TENDON REPAIR  1998   REPLACEMENT TOTAL KNEE Right    TONSILLECTOMY   age 70   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   Procedure: TOTAL KNEE ARTHROPLASTY;  Surgeon: [name], MD;  Location: [facility] ORS;  Service: Orthopedics;  Laterality: Right;   TOTAL KNEE ARTHROPLASTY Left 08/24/2022   Procedure: TOTAL KNEE ARTHROPLASTY;  Surgeon: Durene Romans, MD;  Location: [facility] ORS;  Service: Orthopedics;  Laterality: Left;    Family History  Problem Relation Age of Onset   Hypertension Mother    Hypertension Father    Cancer Father        throat   Throat cancer Father    Cancer Sister        non hodgkins lymphoma   Non-Hodgkin's lymphoma Sister    Non-Hodgkin's lymphoma Brother    Colon cancer Maternal Grandmother 63   Prostate cancer Maternal Uncle     Social  History:  reports that she quit smoking about 12 years ago. Her smoking use included cigarettes. She started smoking about 32 years ago. She has a 10 pack-year smoking history. She has never used smokeless tobacco. She reports current alcohol use. She reports that she does not use drugs.  Allergies:  Allergies  Allergen Reactions   Amlodipine Swelling   Garlic Diarrhea   Hydrocodone Nausea And Vomiting   Sulfa Antibiotics Rash    Medications: I have reviewed the patient's current medications.  Results for orders placed or performed during the hospital encounter of 05/08/23 (from the past 48 hours)  CBC with Differential     Status: Abnormal   Collection Time: 05/08/23  4:29 PM  Result Value Ref Range   WBC 8.1 4.0 - 10.5 K/uL   RBC 2.87 (L) 3.87 - 5.11 MIL/uL   Hemoglobin 9.0 (L) 12.0 - 15.0 g/dL   HCT 92.1 (L) 19.4 - 17.4 %   MCV 94.1 80.0 - 100.0 fL   MCH 31.4 26.0 - 34.0 pg   MCHC 33.3 30.0 - 36.0 g/dL   RDW 08.1 44.8 - 18.5 %   Platelets 166 150 - 400 K/uL   nRBC 0.0 0.0 - 0.2 %  Neutrophils Relative % 78 %   Neutro Abs 6.3 1.7 - 7.7 K/uL   Lymphocytes Relative 12 %   Lymphs Abs 1.0 0.7 - 4.0 K/uL   Monocytes Relative 10 %   Monocytes Absolute 0.8 0.1 - 1.0 K/uL   Eosinophils Relative 0 %   Eosinophils Absolute 0.0 0.0 - 0.5 K/uL   Basophils Relative 0 %   Basophils Absolute 0.0 0.0 - 0.1 K/uL   Immature Granulocytes 0 %   Abs Immature Granulocytes 0.02 0.00 - 0.07 K/uL    Comment: Performed at [facility], [address] [place], [place] [address]  Basic metabolic panel     Status: Abnormal   Collection Time: 05/08/23  4:29 PM  Result Value Ref Range   Sodium 134 (L) 135 - 145 mmol/L   Potassium 5.0 3.5 - 5.1 mmol/L   Chloride 99 98 - 111 mmol/L   CO2 23 22 - 32 mmol/L   Glucose, Bld 167 (H) 70 - 99 mg/dL    Comment: Glucose reference range applies only to samples taken after fasting for at least 8 hours.   BUN 25 (H) 8 - 23 mg/dL   Creatinine, Ser 1.91  0.44 - 1.00 mg/dL   Calcium 8.7 (L) 8.9 - 10.3 mg/dL   GFR, Estimated >47 >82 mL/min    Comment: (NOTE) Calculated using the CKD-EPI Creatinine Equation (2021)    Anion gap 12 5 - 15    Comment: Performed at [facility], [address] [place], [place] [address]  CK     Status: Abnormal   Collection Time: 05/08/23  7:53 PM  Result Value Ref Range   Total CK 4,446 (H) 38 - 234 U/L    Comment: RESULT CONFIRMED BY MANUAL DILUTION Performed at [facility], [address], [place], [place] [address]   Type and screen [facility]     Status: None   Collection Time: 05/08/23  7:54 PM  Result Value Ref Range   ABO/RH(D) A POS    Antibody Screen NEG    Sample Expiration      05/11/2023,2359 Performed at [facility], [address] [place], [place] [address]   Basic metabolic panel with GFR     Status: Abnormal   Collection Time: 05/09/23  6:11 AM  Result Value Ref Range   Sodium 136 135 - 145 mmol/L   Potassium 4.3 3.5 - 5.1 mmol/L   Chloride 99 98 - 111 mmol/L   CO2 32 22 - 32 mmol/L   Glucose, Bld 113 (H) 70 - 99 mg/dL    Comment: Glucose reference range applies only to samples taken after fasting for at least 8 hours.   BUN 30 (H) 8 - 23 mg/dL   Creatinine, Ser 6.29 0.44 - 1.00 mg/dL   Calcium 8.9 8.9 - 52.8 mg/dL   GFR, Estimated >41 >32 mL/min    Comment: (NOTE) Calculated using the CKD-EPI Creatinine Equation (2021)    Anion gap 5 5 - 15    Comment: Performed at [facility], [address] [place], [place] [address]  CBC     Status: Abnormal   Collection Time: 05/09/23  6:11 AM  Result Value Ref Range   WBC 7.3 4.0 - 10.5 K/uL   RBC 2.73 (L) 3.87 - 5.11 MIL/uL   Hemoglobin 8.7 (L) 12.0 - 15.0 g/dL   HCT 27.2 (L) 53.6 - 64.4 %   MCV 92.3 80.0 -  100.0 fL   MCH 31.9 26.0 - 34.0 pg   MCHC 34.5 30.0 - 36.0 g/dL   RDW 78.2 95.6 - 21.3 %   Platelets 167 150 - 400 K/uL   nRBC 0.0 0.0 - 0.2 %    Comment: Performed at [facility], [address], [place], [place] [address]  Magnesium     Status: None   Collection Time: 05/09/23  6:11 AM  Result Value Ref Range   Magnesium 2.1 1.7 - 2.4 mg/dL    Comment: Performed at [facility], [address], [place], [place] [address]  Phosphorus     Status: None   Collection Time: 05/09/23  6:11 AM  Result Value Ref Range   Phosphorus 3.8 2.5 - 4.6 mg/dL    Comment: Performed at [facility], [address], [place], [place] [address]  Ferritin     Status: None   Collection Time: 05/09/23  6:11 AM  Result Value Ref Range   Ferritin 103 11 - 307 ng/mL    Comment: Performed at [facility], [address], [place], [place] [address]  Iron and TIBC     Status: None   Collection Time: 05/09/23  6:11 AM  Result Value Ref Range   Iron 87 28 - 170 ug/dL   TIBC 401 027 - 253 ug/dL   Saturation Ratios 29 10.4 - 31.8 %   UIBC 218 ug/dL    Comment: Performed at [facility], [address] [place], [place] [address]  Vitamin B12     Status: None   Collection Time: 05/09/23  6:11 AM  Result Value Ref Range   Vitamin B-12 457 180 - 914 pg/mL    Comment: (NOTE) This assay is not validated for testing neonatal or myeloproliferative syndrome specimens for Vitamin B12 levels. Performed at [facility], [address], [place], [place] [address]   Folate     Status: None   Collection Time: 05/09/23  6:11 AM  Result Value Ref Range   Folate 36.0 >5.9 ng/mL    Comment: Performed at [facility], [address] [place], [place] [address]  VITAMIN D 25 Hydroxy (Vit-D Deficiency, Fractures)     Status: None   Collection Time: 05/09/23  6:11 AM  Result Value Ref Range   Vit D, 25-Hydroxy 86.55 30 - 100 ng/mL    Comment: (NOTE) Vitamin D deficiency has been defined by the Institute of Medicine  and an Endocrine Society practice guideline as a level of serum 25-OH  vitamin D less than 20 ng/mL (1,2). The Endocrine Society went on to   further define vitamin D insufficiency as a level between 21 and 29  ng/mL (2).  1. IOM (Institute of Medicine). 2010. Dietary reference intakes for  calcium and D. [place]: [employer]. 2. Holick MF, Binkley [place], Bischoff-Ferrari HA, et al. Evaluation,  treatment, and prevention of vitamin D deficiency: an Endocrine  Society clinical practice guideline, JCEM. 2011 Jul; 96(7): 1911-30.  Performed at [facility], [address] [place], [place] [address]   Protime-INR     Status: None   Collection Time: 05/09/23  6:11 AM  Result Value Ref Range   Prothrombin Time 13.4 11.4 - 15.2 seconds   INR 1.0 0.8 - 1.2    Comment: (NOTE) INR goal varies based on device and disease states. Performed at [facility], [address] [place], [place] [address]     DG Femur Min 2 Views Left Result Date: 05/08/2023 CLINICAL DATA:  Status post fall with leg deformity EXAM: LEFT FEMUR 2 VIEWS; PELVIS - 1 VIEW COMPARISON:  Left hip radiograph dated 05/12/2020 FINDINGS: Oblique fracture of the proximal left femoral diaphysis extending to the greater trochanter with 2 shaft width posterior displacement and 3.2 cm foreshortening. No acute pelvic fracture or diastasis. Degenerative changes of the bilateral hips. Old right inferior pubic ramus fracture. Left knee arthroplasty hardware appears intact. Soft tissues are unremarkable. IMPRESSION: Displaced oblique fracture of the proximal left femoral diaphysis extending to the greater trochanter. Electronically Signed   By: Agustin Cree M.D.   On: 05/08/2023 18:37   DG Pelvis 1-2 Views Result Date: 05/08/2023 CLINICAL DATA:  Status post fall with leg deformity EXAM: LEFT FEMUR 2 VIEWS; PELVIS - 1 VIEW COMPARISON:  Left hip radiograph dated 05/12/2020 FINDINGS: Oblique fracture of the proximal left femoral diaphysis extending to the greater trochanter with 2 shaft width posterior displacement and 3.2 cm foreshortening. No acute pelvic  fracture or diastasis. Degenerative changes of the bilateral hips. Old right inferior pubic ramus fracture. Left knee arthroplasty hardware appears intact. Soft tissues are unremarkable. IMPRESSION: Displaced oblique fracture of the proximal left femoral diaphysis extending to the greater trochanter. Electronically Signed   By: Agustin Cree M.D.   On: 05/08/2023 18:37   DG Femur Min 2 Views Right Result Date: 05/08/2023 CLINICAL DATA:  Status post fall EXAM: RIGHT FEMUR 2 VIEWS COMPARISON:  Radiograph of the pelvis and left hip dated 05/08/2020 FINDINGS: There is no evidence of acute displaced fracture. Old inferior right pubic ramus fracture. Soft tissues are unremarkable. Vascular calcifications. Right knee arthroplasty hardware appears intact and well seated. IMPRESSION: No acute displaced right femoral fracture. Old inferior right pubic ramus fracture. Electronically Signed   By: Agustin Cree M.D.   On: 05/08/2023 18:30    Review of Systems  HENT:  Negative for ear discharge, ear pain, hearing loss and tinnitus.   Eyes:  Negative for photophobia and pain.  Respiratory:  Negative for cough and shortness of breath.   Cardiovascular:  Negative for chest pain.  Gastrointestinal:  Negative for abdominal pain, nausea and vomiting.  Genitourinary:  Negative for dysuria, flank pain, frequency and urgency.  Musculoskeletal:  Positive for arthralgias (Left hip). Negative for back pain, myalgias and neck pain.  Neurological:  Negative for dizziness and headaches.  Hematological:  Does not bruise/bleed easily.  Psychiatric/Behavioral:  The patient is not nervous/anxious.    Blood pressure 110/72, pulse 91, temperature 98 F (36.7 C), temperature source Oral, resp. rate 18, height 5\' 1"  (1.549 m), weight 59.9 kg, SpO2 96%. Physical Exam Constitutional:      General: She is not in acute distress.    Appearance: She is well-developed. She is not diaphoretic.  HENT:     Head: Normocephalic and atraumatic.   Eyes:     General: No scleral icterus.       Right eye: No discharge.        Left eye: No discharge.     Conjunctiva/sclera: Conjunctivae normal.  Cardiovascular:     Rate and Rhythm: Normal rate and regular rhythm.  Pulmonary:     Effort: Pulmonary effort is normal. No respiratory distress.  Musculoskeletal:     Cervical back: Normal range of motion.     Comments: LLE No traumatic wounds, ecchymosis, or rash  Mod TTP hip  No knee or  ankle effusion  Knee stable to varus/ valgus and anterior/posterior stress  Sens DPN, SPN, TN intact  Motor EHL, ext, flex, evers 5/5  DP 2+, PT 2+, No significant edema  Skin:    General: Skin is warm and dry.  Neurological:     Mental Status: She is alert.  Psychiatric:        Mood and Affect: Mood normal.        Behavior: Behavior normal.     Assessment/Plan: Left hip fx -- Plan IMN today with Dr. Jena Gauss. Please keep NPO. Multiple medical problems including prior CVA, hypertension, hyperlipidemia, wet AMD, blind OD, and mood disorder -- per primary service    Freeman Caldron, PA-C Orthopedic Surgery [phone] 05/09/2023, 9:46 AM

## 2023-05-09 NOTE — Plan of Care (Signed)
  Problem: Pain Managment: Goal: General experience of comfort will improve and/or be controlled Outcome: Progressing   Problem: Safety: Goal: Ability to remain free from injury will improve Outcome: Progressing   Problem: Skin Integrity: Goal: Risk for impaired skin integrity will decrease Outcome: Progressing

## 2023-05-09 NOTE — Transfer of Care (Signed)
 Immediate Anesthesia Transfer of Care Note  Patient: Sarah Castro  Procedure(s) Performed: FIXATION, FRACTURE, INTERTROCHANTERIC, WITH INTRAMEDULLARY ROD (Left)  Patient Location: PACU  Anesthesia Type:General  Level of Consciousness: awake, drowsy, and patient cooperative  Airway & Oxygen Therapy: Patient Spontanous Breathing and Patient connected to nasal cannula oxygen  Post-op Assessment: Report given to RN and Post -op Vital signs reviewed and stable  Post vital signs: Reviewed and stable  Last Vitals:  Vitals Value Taken Time  BP 146/71 05/09/23 1657  Temp    Pulse 99 05/09/23 1700  Resp 17 05/09/23 1700  SpO2 97 % 05/09/23 1700  Vitals shown include unfiled device data.  Last Pain:  Vitals:   05/09/23 1342  TempSrc:   PainSc: 2       Patients Stated Pain Goal: 2 (05/09/23 1342)  Complications: No notable events documented.

## 2023-05-09 NOTE — Op Note (Signed)
 Orthopaedic Surgery Operative Note (CSN: [id number] ) Date of Surgery: 05/09/2023  Admit Date: 05/08/2023   Diagnoses: Pre-Op Diagnoses: Left proximal subtrochanteric/intertrochanteric femur fracture  Post-Op Diagnosis: Same  Procedures: CPT 27245-Cephalomedullary nailing of left intertrochanteric femur fracture  Surgeons : Primary: Roby Lofts, MD  Assistant: Thyra Breed, PA-C  Location: OR 3   Anesthesia: General   Antibiotics: Ancef 2g preop   Tourniquet time: None    Estimated Blood Loss: 150 mL  Complications:* No complications entered in OR log *   Specimens:* No specimens in log *   Implants: Implant Name Type Inv. Item Serial No. Manufacturer Lot No. LRB No. Used Action  NAIL LOCK CANN [id number] 130D LT - [id number] Nail NAIL LOCK CANN 10X360 130D LT  [employer] [id number] Left 1 Implanted  SCREW LAG COMPR KIT 110/105 - [id number] Screw SCREW LAG COMPR KIT 110/105  [employer] [id number] Left 1 Implanted  SCREW TRIGEN LOW PROF 5.0X40 - [id number] Screw SCREW TRIGEN LOW PROF 5.0X40  [employer] [id number] Left 1 Implanted     Indications for Surgery: 83 year old female who sustained a ground-level fall with a left proximal femur fracture.  Due to the unstable nature of her injury I recommend proceeding with cephalomedullary nailing of the left hip.  Risks and benefits were discussed with the patient.  Risks include but not limited to bleeding, infection, malunion, nonunion, hardware failure, hardware rotation, nerve and blood vessel injury, even the possibility anesthetic complications.  She agreed to proceed with surgery consent was obtained.  Operative Findings: Cephalomedullary nailing of left intertrochanteric femur fracture using [employer] InterTAN 10 x 360 mm nail with 110 mm lag screw and 105 mm compression screw.  Procedure: The patient was identified in the preoperative holding area. Consent was  confirmed with the patient and their family and all questions were answered. The operative extremity was marked after confirmation with the patient. she was then brought back to the operating room by our anesthesia colleagues.  She was placed under general anesthetic and carefully transferred over to a Hana table.  All bony prominences were well-padded.  Traction was applied to the left lower extremity alignment was maintained.  The left lower extremity was then prepped and draped in usual sterile fashion.  A timeout was performed to verify the patient, the procedure, and the extremity.  Preoperative antibiotics were dosed.  Small incision proximal to the greater trochanter was made and carried down through skin and subcutaneous tissue.  A threaded guidewire was directed at the tip of the greater trochanter and advanced into the proximal metaphysis.  An entry reamer was used to enter the medullary canal.  A percutaneous incision laterally was placed to allow a Cobb elevator to elevate the shaft component of the fracture.  Once I had alignment I then was able to manipulate the fracture and passed the guidewire down the center of the canal and seated into the distal metaphysis.  I then measured the length and chose to use a 360 mm nail.  I then sequentially reamed from 9 mm to 11.5 mm.  11.5 mm.  I then placed a 10 mm nail.  During insertion of the nail there was propagation of the proximal fracture that caused the proximal segment to split.  I was able to place the nail down the center of the canal.  I then used a targeting arm to direct a threaded guidewire into the head/neck segment.  I confirmed adequate tip  apex distance with fluoroscopy.  I then measured the length and chose to use 110 mm lag screw.  I then drilled the path for the compression screw and placed an antirotation bar.  I then drilled the path for the lag screw and placed the lag screw.  I then was able to place the compression screw and  compressed approximately 5 to 7 mm.  I statically locked the proximal portion of the nail.  I then used perfect circle technique to place a lateral to medial distal interlocking screw.  The targeting arm was removed and final fluoroscopic imaging was obtained.  The incisions were copiously irrigated and closed with 0 Vicryl, 2-0 Monocryl and Dermabond.  Sterile dressings were applied.  The patient was then awoke from anesthesia and taken to the PACU in stable condition.  Post Op Plan/Instructions: Patient will be weightbearing as tolerated to the left lower extremity.  She will receive postoperative Ancef.  She will receive Lovenox for DVT prophylaxis and discharged on oral DOAC.  Will have her mobilize with physical and Occupational Therapy.  I was present and performed the entire surgery.  Thyra Breed, PA-C did assist me throughout the case. An assistant was necessary given the difficulty in approach, maintenance of reduction and ability to instrument the fracture.   Truitt Merle, MD [facility]

## 2023-05-09 NOTE — H&P (View-Only) (Signed)
 Reason for Consult:Left hip fx Referring Physician: Carma Leaven Time called: [phone] Time at bedside: 0914   Sarah Castro is an 83 y.o. female.  HPI: Nuria fell at home when she tripped on a rug. She had immediate left hip pain and could not get up. She was brought to the ED where x-rays showed a left hip fx and orthopedic surgery was consulted. She lives at home alone and uses a cane or RW for ambulation.  Past Medical History:  Diagnosis Date   Arthritis    Depression with anxiety    Hypercholesteremia    Hypertension    Macular degeneration    PONV (postoperative nausea and vomiting)    Pre-diabetes    Stroke [facility])     Past Surgical History:  Procedure Laterality Date   APPENDECTOMY     broken back     BUNIONECTOMY  2013   CATARACT EXTRACTION     right   HAMMER TOE SURGERY  2013   HEMATOMA EVACUATION  5 years ago   IR RADIOLOGIST EVAL & MGMT  12/04/2019   KNEE ARTHROSCOPY     right   LAPAROSCOPIC APPENDECTOMY N/A 10/24/2012   Procedure: APPENDECTOMY LAPAROSCOPIC;  Surgeon: Emelia Loron, MD;  Location: [facility] OR;  Service: General;  Laterality: N/A;   LAPAROSCOPIC APPENDECTOMY     POSTERIOR TIBIAL TENDON REPAIR  1998   REPLACEMENT TOTAL KNEE Right    TONSILLECTOMY   age 70   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   Procedure: TOTAL KNEE ARTHROPLASTY;  Surgeon: [name], MD;  Location: [facility] ORS;  Service: Orthopedics;  Laterality: Right;   TOTAL KNEE ARTHROPLASTY Left 08/24/2022   Procedure: TOTAL KNEE ARTHROPLASTY;  Surgeon: Durene Romans, MD;  Location: [facility] ORS;  Service: Orthopedics;  Laterality: Left;    Family History  Problem Relation Age of Onset   Hypertension Mother    Hypertension Father    Cancer Father        throat   Throat cancer Father    Cancer Sister        non hodgkins lymphoma   Non-Hodgkin's lymphoma Sister    Non-Hodgkin's lymphoma Brother    Colon cancer Maternal Grandmother 63   Prostate cancer Maternal Uncle     Social  History:  reports that she quit smoking about 12 years ago. Her smoking use included cigarettes. She started smoking about 32 years ago. She has a 10 pack-year smoking history. She has never used smokeless tobacco. She reports current alcohol use. She reports that she does not use drugs.  Allergies:  Allergies  Allergen Reactions   Amlodipine Swelling   Garlic Diarrhea   Hydrocodone Nausea And Vomiting   Sulfa Antibiotics Rash    Medications: I have reviewed the patient's current medications.  Results for orders placed or performed during the hospital encounter of 05/08/23 (from the past 48 hours)  CBC with Differential     Status: Abnormal   Collection Time: 05/08/23  4:29 PM  Result Value Ref Range   WBC 8.1 4.0 - 10.5 K/uL   RBC 2.87 (L) 3.87 - 5.11 MIL/uL   Hemoglobin 9.0 (L) 12.0 - 15.0 g/dL   HCT 92.1 (L) 19.4 - 17.4 %   MCV 94.1 80.0 - 100.0 fL   MCH 31.4 26.0 - 34.0 pg   MCHC 33.3 30.0 - 36.0 g/dL   RDW 08.1 44.8 - 18.5 %   Platelets 166 150 - 400 K/uL   nRBC 0.0 0.0 - 0.2 %  Neutrophils Relative % 78 %   Neutro Abs 6.3 1.7 - 7.7 K/uL   Lymphocytes Relative 12 %   Lymphs Abs 1.0 0.7 - 4.0 K/uL   Monocytes Relative 10 %   Monocytes Absolute 0.8 0.1 - 1.0 K/uL   Eosinophils Relative 0 %   Eosinophils Absolute 0.0 0.0 - 0.5 K/uL   Basophils Relative 0 %   Basophils Absolute 0.0 0.0 - 0.1 K/uL   Immature Granulocytes 0 %   Abs Immature Granulocytes 0.02 0.00 - 0.07 K/uL    Comment: Performed at [facility], [address] [place], [place] [address]  Basic metabolic panel     Status: Abnormal   Collection Time: 05/08/23  4:29 PM  Result Value Ref Range   Sodium 134 (L) 135 - 145 mmol/L   Potassium 5.0 3.5 - 5.1 mmol/L   Chloride 99 98 - 111 mmol/L   CO2 23 22 - 32 mmol/L   Glucose, Bld 167 (H) 70 - 99 mg/dL    Comment: Glucose reference range applies only to samples taken after fasting for at least 8 hours.   BUN 25 (H) 8 - 23 mg/dL   Creatinine, Ser 1.91  0.44 - 1.00 mg/dL   Calcium 8.7 (L) 8.9 - 10.3 mg/dL   GFR, Estimated >47 >82 mL/min    Comment: (NOTE) Calculated using the CKD-EPI Creatinine Equation (2021)    Anion gap 12 5 - 15    Comment: Performed at [facility], [address] [place], [place] [address]  CK     Status: Abnormal   Collection Time: 05/08/23  7:53 PM  Result Value Ref Range   Total CK 4,446 (H) 38 - 234 U/L    Comment: RESULT CONFIRMED BY MANUAL DILUTION Performed at [facility], [address], [place], [place] [address]   Type and screen [facility]     Status: None   Collection Time: 05/08/23  7:54 PM  Result Value Ref Range   ABO/RH(D) A POS    Antibody Screen NEG    Sample Expiration      05/11/2023,2359 Performed at [facility], [address] [place], [place] [address]   Basic metabolic panel with GFR     Status: Abnormal   Collection Time: 05/09/23  6:11 AM  Result Value Ref Range   Sodium 136 135 - 145 mmol/L   Potassium 4.3 3.5 - 5.1 mmol/L   Chloride 99 98 - 111 mmol/L   CO2 32 22 - 32 mmol/L   Glucose, Bld 113 (H) 70 - 99 mg/dL    Comment: Glucose reference range applies only to samples taken after fasting for at least 8 hours.   BUN 30 (H) 8 - 23 mg/dL   Creatinine, Ser 6.29 0.44 - 1.00 mg/dL   Calcium 8.9 8.9 - 52.8 mg/dL   GFR, Estimated >41 >32 mL/min    Comment: (NOTE) Calculated using the CKD-EPI Creatinine Equation (2021)    Anion gap 5 5 - 15    Comment: Performed at [facility], [address] [place], [place] [address]  CBC     Status: Abnormal   Collection Time: 05/09/23  6:11 AM  Result Value Ref Range   WBC 7.3 4.0 - 10.5 K/uL   RBC 2.73 (L) 3.87 - 5.11 MIL/uL   Hemoglobin 8.7 (L) 12.0 - 15.0 g/dL   HCT 27.2 (L) 53.6 - 64.4 %   MCV 92.3 80.0 -  100.0 fL   MCH 31.9 26.0 - 34.0 pg   MCHC 34.5 30.0 - 36.0 g/dL   RDW 78.2 95.6 - 21.3 %   Platelets 167 150 - 400 K/uL   nRBC 0.0 0.0 - 0.2 %    Comment: Performed at [facility], [address], [place], [place] [address]  Magnesium     Status: None   Collection Time: 05/09/23  6:11 AM  Result Value Ref Range   Magnesium 2.1 1.7 - 2.4 mg/dL    Comment: Performed at [facility], [address], [place], [place] [address]  Phosphorus     Status: None   Collection Time: 05/09/23  6:11 AM  Result Value Ref Range   Phosphorus 3.8 2.5 - 4.6 mg/dL    Comment: Performed at [facility], [address], [place], [place] [address]  Ferritin     Status: None   Collection Time: 05/09/23  6:11 AM  Result Value Ref Range   Ferritin 103 11 - 307 ng/mL    Comment: Performed at [facility], [address], [place], [place] [address]  Iron and TIBC     Status: None   Collection Time: 05/09/23  6:11 AM  Result Value Ref Range   Iron 87 28 - 170 ug/dL   TIBC 401 027 - 253 ug/dL   Saturation Ratios 29 10.4 - 31.8 %   UIBC 218 ug/dL    Comment: Performed at [facility] Lab, [address] [place], [place] [address]  Vitamin B12     Status: None   Collection Time: 05/09/23  6:11 AM  Result Value Ref Range   Vitamin B-12 457 180 - 914 pg/mL    Comment: (NOTE) This assay is not validated for testing neonatal or myeloproliferative syndrome specimens for Vitamin B12 levels. Performed at [facility], [address], [place], [place] [address]   Folate     Status: None   Collection Time: 05/09/23  6:11 AM  Result Value Ref Range   Folate 36.0 >5.9 ng/mL    Comment: Performed at [facility], [address] [place], [place] [address]  VITAMIN D 25 Hydroxy (Vit-D Deficiency, Fractures)     Status: None   Collection Time: 05/09/23  6:11 AM  Result Value Ref Range   Vit D, 25-Hydroxy 86.55 30 - 100 ng/mL    Comment: (NOTE) Vitamin D deficiency has been defined by the Institute of Medicine  and an Endocrine Society practice guideline as a level of serum 25-OH  vitamin D less than 20 ng/mL (1,2). The Endocrine Society went on to   further define vitamin D insufficiency as a level between 21 and 29  ng/mL (2).  1. IOM (Institute of Medicine). 2010. Dietary reference intakes for  calcium and D. [place]: [employer]. 2. Holick MF, Binkley [place], Bischoff-Ferrari HA, et al. Evaluation,  treatment, and prevention of vitamin D deficiency: an Endocrine  Society clinical practice guideline, JCEM. 2011 Jul; 96(7): 1911-30.  Performed at [facility] Lab, [address] [place], [place] [address]   Protime-INR     Status: None   Collection Time: 05/09/23  6:11 AM  Result Value Ref Range   Prothrombin Time 13.4 11.4 - 15.2 seconds   INR 1.0 0.8 - 1.2    Comment: (NOTE) INR goal varies based on device and disease states. Performed at [facility], [address] [place], [place] [address]     DG Femur Min 2 Views Left Result Date: 05/08/2023 CLINICAL DATA:  Status post fall with leg deformity EXAM: LEFT FEMUR 2 VIEWS; PELVIS - 1 VIEW COMPARISON:  Left hip radiograph dated 05/12/2020 FINDINGS: Oblique fracture of the proximal left femoral diaphysis extending to the greater trochanter with 2 shaft width posterior displacement and 3.2 cm foreshortening. No acute pelvic fracture or diastasis. Degenerative changes of the bilateral hips. Old right inferior pubic ramus fracture. Left knee arthroplasty hardware appears intact. Soft tissues are unremarkable. IMPRESSION: Displaced oblique fracture of the proximal left femoral diaphysis extending to the greater trochanter. Electronically Signed   By: Agustin Cree M.D.   On: 05/08/2023 18:37   DG Pelvis 1-2 Views Result Date: 05/08/2023 CLINICAL DATA:  Status post fall with leg deformity EXAM: LEFT FEMUR 2 VIEWS; PELVIS - 1 VIEW COMPARISON:  Left hip radiograph dated 05/12/2020 FINDINGS: Oblique fracture of the proximal left femoral diaphysis extending to the greater trochanter with 2 shaft width posterior displacement and 3.2 cm foreshortening. No acute pelvic  fracture or diastasis. Degenerative changes of the bilateral hips. Old right inferior pubic ramus fracture. Left knee arthroplasty hardware appears intact. Soft tissues are unremarkable. IMPRESSION: Displaced oblique fracture of the proximal left femoral diaphysis extending to the greater trochanter. Electronically Signed   By: Agustin Cree M.D.   On: 05/08/2023 18:37   DG Femur Min 2 Views Right Result Date: 05/08/2023 CLINICAL DATA:  Status post fall EXAM: RIGHT FEMUR 2 VIEWS COMPARISON:  Radiograph of the pelvis and left hip dated 05/08/2020 FINDINGS: There is no evidence of acute displaced fracture. Old inferior right pubic ramus fracture. Soft tissues are unremarkable. Vascular calcifications. Right knee arthroplasty hardware appears intact and well seated. IMPRESSION: No acute displaced right femoral fracture. Old inferior right pubic ramus fracture. Electronically Signed   By: Agustin Cree M.D.   On: 05/08/2023 18:30    Review of Systems  HENT:  Negative for ear discharge, ear pain, hearing loss and tinnitus.   Eyes:  Negative for photophobia and pain.  Respiratory:  Negative for cough and shortness of breath.   Cardiovascular:  Negative for chest pain.  Gastrointestinal:  Negative for abdominal pain, nausea and vomiting.  Genitourinary:  Negative for dysuria, flank pain, frequency and urgency.  Musculoskeletal:  Positive for arthralgias (Left hip). Negative for back pain, myalgias and neck pain.  Neurological:  Negative for dizziness and headaches.  Hematological:  Does not bruise/bleed easily.  Psychiatric/Behavioral:  The patient is not nervous/anxious.    Blood pressure 110/72, pulse 91, temperature 98 F (36.7 C), temperature source Oral, resp. rate 18, height 5\' 1"  (1.549 m), weight 59.9 kg, SpO2 96%. Physical Exam Constitutional:      General: She is not in acute distress.    Appearance: She is well-developed. She is not diaphoretic.  HENT:     Head: Normocephalic and atraumatic.   Eyes:     General: No scleral icterus.       Right eye: No discharge.        Left eye: No discharge.     Conjunctiva/sclera: Conjunctivae normal.  Cardiovascular:     Rate and Rhythm: Normal rate and regular rhythm.  Pulmonary:     Effort: Pulmonary effort is normal. No respiratory distress.  Musculoskeletal:     Cervical back: Normal range of motion.     Comments: LLE No traumatic wounds, ecchymosis, or rash  Mod TTP hip  No knee or  ankle effusion  Knee stable to varus/ valgus and anterior/posterior stress  Sens DPN, SPN, TN intact  Motor EHL, ext, flex, evers 5/5  DP 2+, PT 2+, No significant edema  Skin:    General: Skin is warm and dry.  Neurological:     Mental Status: She is alert.  Psychiatric:        Mood and Affect: Mood normal.        Behavior: Behavior normal.     Assessment/Plan: Left hip fx -- Plan IMN today with Dr. Jena Gauss. Please keep NPO. Multiple medical problems including prior CVA, hypertension, hyperlipidemia, wet AMD, blind OD, and mood disorder -- per primary service    Freeman Caldron, PA-C Orthopedic Surgery [phone] 05/09/2023, 9:46 AM

## 2023-05-09 NOTE — Progress Notes (Signed)
 Date and time results received: 05/09/23 1859  Test: Hgb Critical Value: 5.8  Name of Provider Notified: Carma Leaven, MD paged  Orders Received? Or Actions Taken?: See new order for STAT H&H

## 2023-05-09 NOTE — Progress Notes (Signed)
 PROGRESS NOTE    Sarah Castro  LKG:[id number] DOB: 17-Jun-1940 DOA: 05/08/2023 PCP: Georgann Housekeeper, MD   Brief Narrative:  Sarah Castro is a 83 y.o. 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  Patient reports being on the ground overnight until neighbor checked on her the following morning.  Given worsening left hip pain and inability to walk patient was brought to our facility for further evaluation.  Imaging confirmed left femoral shaft fracture.  Hospitalist called for admission, orthopedics called in consult.  Assessment & Plan:   Principal Problem:   Closed displaced oblique fracture of shaft of left femur (HCC) Active Problems:   Ground-level fall   Normocytic anemia   Hyperkalemia  Left femoral shaft fracture fracture Clinical diagnosis osteoporosis Ground-level fall -Orthopedic surgery following, tentative plan for ORIF later today -Pain management DVT prophylaxis per orthopedics -Pain currently well-controlled on low-dose IV Dilaudid, oxycodone and Tylenol -Continue vitamin D supplementation -PT OT to follow, will likely require ongoing physical therapy postoperatively  Iron deficiency, chronic anemia, normocytic -Hemoglobin 9, near baseline -History of iron deficiency but unable to tolerate iron well, discussed high iron diet -Iron panel unremarkable, B12 and folate within normal limits   Diminished pedal pulses Rule out peripheral arterial disease - RN to Doppler pulses - ABI bilateral pending   First-degree AV block Continue metoprolol, asymptomatic -PR interval 270 at intake   Chronic medical problems: History of prior CVA: Incidentally noted, not on aspirin, continue statin Hypertension: Continue metoprolol, hold home olmesartan, doxazosin, clonidine Hyperlipidemia: Continue statin. Wet macular degeneration, blind OD (sequela  of prior endophthalmitis): Outpatient follow-up Mood disorder: Continue home escitalopram  DVT prophylaxis: SCDs Start: 05/08/23 1951 Code Status:   Code Status: Full Code Family Communication: None present  Status is: Inpatient  Dispo: The patient is from: Home              Anticipated d/c is to: To be determined              Anticipated d/c date is: 48 to 72 hours pending clinical and surgical course              Patient currently not medically stable for discharge given ongoing need for surgical intervention and postop evaluation  Consultants:  Orthopedic surgery  Procedures:  Pending ORIF left femur above  Antimicrobials:  Perioperatively  Subjective: No acute issues or events overnight, indicates pain is improved but not resolved otherwise denies paresthesias nausea vomiting diarrhea constipation headache fevers chills or chest pain  Objective: Vitals:   05/08/23 1853 05/08/23 2015 05/08/23 2151 05/09/23 0429  BP:  121/73 (!) 116/53 (!) 148/66  Pulse:  77 99 83  Resp:  (!) 24 17 17   Temp: 98.8 F (37.1 C)  98.4 F (36.9 C) 98.2 F (36.8 C)  TempSrc: Oral   Oral  SpO2:  100% 95% 98%  Weight:      Height:       No intake or output data in the 24 hours ending 05/09/23 0757 Filed Weights   05/08/23 1511  Weight: 59.9 kg    Examination:  General:  Pleasantly resting in bed, No acute distress. HEENT:  Normocephalic atraumatic.  Sclerae nonicteric, noninjected. Neck:  Without mass or deformity.  Trachea is midline. Lungs:  Clear to auscultate bilaterally without rhonchi, wheeze, or rales. Heart:  Regular rate and rhythm.  Without murmurs, rubs, or gallops. Abdomen:  Soft, nontender, nondistended.  Without guarding or rebound. Extremities: Left lower extremity in traction   Data Reviewed: I have personally reviewed following labs and imaging studies  CBC: Recent Labs  Lab 05/08/23 1629 05/09/23 0611  WBC 8.1 7.3  NEUTROABS 6.3  --   HGB 9.0* 8.7*  HCT  27.0* 25.2*  MCV 94.1 92.3  PLT 166 167   Basic Metabolic Panel: Recent Labs  Lab 05/08/23 1629 05/09/23 0611  NA 134* 136  K 5.0 4.3  CL 99 99  CO2 23 32  GLUCOSE 167* 113*  BUN 25* 30*  CREATININE 0.86 0.90  CALCIUM 8.7* 8.9  MG  --  2.1  PHOS  --  3.8   GFR: Estimated Creatinine Clearance: 40 mL/min (by C-G formula based on SCr of 0.9 mg/dL).  Coagulation Profile: Recent Labs  Lab 05/09/23 0611  INR 1.0   Cardiac Enzymes: Recent Labs  Lab 05/08/23 1953  CKTOTAL 4,446*   Anemia Panel: Recent Labs    05/09/23 0611  VITAMINB12 457  FOLATE 36.0  FERRITIN 103  TIBC 305  IRON 87   Sepsis Labs: No results for input(s): "PROCALCITON", "LATICACIDVEN" in the last 168 hours.  No results found for this or any previous visit (from the past 240 hours).   Radiology Studies: DG Femur Min 2 Views Left Result Date: 05/08/2023 CLINICAL DATA:  Status post fall with leg deformity EXAM: LEFT FEMUR 2 VIEWS; PELVIS - 1 VIEW COMPARISON:  Left hip radiograph dated 05/12/2020 FINDINGS: Oblique fracture of the proximal left femoral diaphysis extending to the greater trochanter with 2 shaft width posterior displacement and 3.2 cm foreshortening. No acute pelvic fracture or diastasis. Degenerative changes of the bilateral hips. Old right inferior pubic ramus fracture. Left knee arthroplasty hardware appears intact. Soft tissues are unremarkable. IMPRESSION: Displaced oblique fracture of the proximal left femoral diaphysis extending to the greater trochanter. Electronically Signed   By: Agustin Cree M.D.   On: 05/08/2023 18:37   DG Pelvis 1-2 Views Result Date: 05/08/2023 CLINICAL DATA:  Status post fall with leg deformity EXAM: LEFT FEMUR 2 VIEWS; PELVIS - 1 VIEW COMPARISON:  Left hip radiograph dated 05/12/2020 FINDINGS: Oblique fracture of the proximal left femoral diaphysis extending to the greater trochanter with 2 shaft width posterior displacement and 3.2 cm foreshortening. No acute  pelvic fracture or diastasis. Degenerative changes of the bilateral hips. Old right inferior pubic ramus fracture. Left knee arthroplasty hardware appears intact. Soft tissues are unremarkable. IMPRESSION: Displaced oblique fracture of the proximal left femoral diaphysis extending to the greater trochanter. Electronically Signed   By: Agustin Cree M.D.   On: 05/08/2023 18:37   DG Femur Min 2 Views Right Result Date: 05/08/2023 CLINICAL DATA:  Status post fall EXAM: RIGHT FEMUR 2 VIEWS COMPARISON:  Radiograph of the pelvis and left hip dated 05/08/2020 FINDINGS: There is no evidence of acute displaced fracture. Old inferior right pubic ramus fracture. Soft tissues are unremarkable. Vascular calcifications. Right knee arthroplasty hardware appears intact and well seated. IMPRESSION: No acute displaced right femoral fracture. Old inferior right pubic ramus fracture. Electronically Signed   By: Agustin Cree M.D.   On: 05/08/2023 18:30   Scheduled Meds:  cholecalciferol  1,000 Units Oral Daily   escitalopram  10 mg Oral Daily   metoprolol succinate  25 mg Oral QPM   pravastatin  20 mg Oral q1800   sodium chloride flush  3 mL Intravenous Q12H   Continuous Infusions:  sodium chloride  LOS: 1 day   Time spent:  Azucena Fallen, DO [facility]  If 7PM-7AM, please contact night-coverage [email or web address]  05/09/2023, 7:57 AM

## 2023-05-09 NOTE — Progress Notes (Signed)
 Pt returned to [id number] via bed from PACU. Received report from [place], Charity fundraiser. See focused reassessment.

## 2023-05-09 NOTE — Anesthesia Procedure Notes (Signed)
 Procedure Name: Intubation Date/Time: 05/09/2023 3:25 PM  Performed by: Little Ishikawa, CRNAPre-anesthesia Checklist: Patient identified, Emergency Drugs available, Suction available, Timeout performed and Patient being monitored Patient Re-evaluated:Patient Re-evaluated prior to induction Oxygen Delivery Method: Circle system utilized Preoxygenation: Pre-oxygenation with 100% oxygen Induction Type: IV induction Ventilation: Mask ventilation without difficulty Laryngoscope Size: Mac and 3 Grade View: Grade I Tube type: Oral Tube size: 7.0 mm Number of attempts: 1 Airway Equipment and Method: Stylet Placement Confirmation: ETT inserted through vocal cords under direct vision, positive ETCO2, CO2 detector and breath sounds checked- equal and bilateral Secured at: 22 cm Tube secured with: Tape Dental Injury: Teeth and Oropharynx as per pre-operative assessment

## 2023-05-09 NOTE — Anesthesia Preprocedure Evaluation (Addendum)
 Anesthesia Evaluation  Patient identified by MRN, date of birth, ID band Patient awake    Reviewed: Allergy & Precautions, NPO status , Patient's Chart, lab work & pertinent test results  History of Anesthesia Complications (+) PONV and history of anesthetic complications  Airway Mallampati: I  TM Distance: >3 FB Neck ROM: Full   Comment: Previous grade I view with MAC 3, easy mask with OPA Dental  (+) Dental Advisory Given, Missing   Pulmonary neg shortness of breath, neg sleep apnea, neg COPD, neg recent URI, former smoker   Pulmonary exam normal breath sounds clear to auscultation       Cardiovascular hypertension, Pt. on medications and Pt. on home beta blockers (-) angina (-) Past MI, (-) Cardiac Stents and (-) CABG (-) dysrhythmias  Rhythm:Regular Rate:Normal  HLD   Neuro/Psych neg Seizures PSYCHIATRIC DISORDERS Anxiety Depression     Neuromuscular disease (lumbar radiculopathy) CVA (patient denies)    GI/Hepatic negative GI ROS, Neg liver ROS,,,  Endo/Other  Pre-diabetes  Renal/GU negative Renal ROS     Musculoskeletal  (+) Arthritis ,    Abdominal   Peds  Hematology  (+) Blood dyscrasia, anemia Lab Results      Component                Value               Date                      WBC                      7.3                 05/09/2023                HGB                      8.7 (L)             05/09/2023                HCT                      25.2 (L)            05/09/2023                MCV                      92.3                05/09/2023                PLT                      167                 05/09/2023              Anesthesia Other Findings   Reproductive/Obstetrics                             Anesthesia Physical Anesthesia Plan  ASA: 2  Anesthesia Plan: General   Post-op Pain Management: Ofirmev IV (intra-op)*   Induction: Intravenous  PONV Risk Score and  Plan: 4 or greater and Ondansetron, Dexamethasone and Treatment may vary  due to age or medical condition  Airway Management Planned: Oral ETT  Additional Equipment:   Intra-op Plan:   Post-operative Plan: Extubation in OR  Informed Consent: I have reviewed the patients History and Physical, chart, labs and discussed the procedure including the risks, benefits and alternatives for the proposed anesthesia with the patient or authorized representative who has indicated his/her understanding and acceptance.     Dental advisory given  Plan Discussed with: CRNA and Anesthesiologist  Anesthesia Plan Comments: (Risks of general anesthesia discussed including, but not limited to, sore throat, hoarse voice, chipped/damaged teeth, injury to vocal cords, nausea and vomiting, allergic reactions, lung infection, heart attack, stroke, and death. All questions answered. )       Anesthesia Quick Evaluation

## 2023-05-10 ENCOUNTER — Encounter (HOSPITAL_COMMUNITY): Payer: Self-pay | Admitting: Student

## 2023-05-10 ENCOUNTER — Inpatient Hospital Stay (HOSPITAL_COMMUNITY)

## 2023-05-10 DIAGNOSIS — I7090 Unspecified atherosclerosis: Secondary | ICD-10-CM

## 2023-05-10 DIAGNOSIS — D649 Anemia, unspecified: Secondary | ICD-10-CM | POA: Diagnosis not present

## 2023-05-10 DIAGNOSIS — W1830XA Fall on same level, unspecified, initial encounter: Secondary | ICD-10-CM | POA: Diagnosis not present

## 2023-05-10 DIAGNOSIS — S72332A Displaced oblique fracture of shaft of left femur, initial encounter for closed fracture: Secondary | ICD-10-CM | POA: Diagnosis not present

## 2023-05-10 LAB — CBC
HCT: 17.8 % — ABNORMAL LOW (ref 36.0–46.0)
Hemoglobin: 5.9 g/dL — CL (ref 12.0–15.0)
MCH: 31.6 pg (ref 26.0–34.0)
MCHC: 33.1 g/dL (ref 30.0–36.0)
MCV: 95.2 fL (ref 80.0–100.0)
Platelets: 147 10*3/uL — ABNORMAL LOW (ref 150–400)
RBC: 1.87 MIL/uL — ABNORMAL LOW (ref 3.87–5.11)
RDW: 12.9 % (ref 11.5–15.5)
WBC: 7.6 10*3/uL (ref 4.0–10.5)
nRBC: 0 % (ref 0.0–0.2)

## 2023-05-10 LAB — BASIC METABOLIC PANEL WITH GFR
Anion gap: 10 (ref 5–15)
BUN: 25 mg/dL — ABNORMAL HIGH (ref 8–23)
CO2: 25 mmol/L (ref 22–32)
Calcium: 7.9 mg/dL — ABNORMAL LOW (ref 8.9–10.3)
Chloride: 98 mmol/L (ref 98–111)
Creatinine, Ser: 0.69 mg/dL (ref 0.44–1.00)
GFR, Estimated: >60 mL/min (ref >60)
Glucose, Bld: 144 mg/dL — ABNORMAL HIGH (ref 70–99)
Potassium: 4.7 mmol/L (ref 3.5–5.1)
Sodium: 133 mmol/L — ABNORMAL LOW (ref 135–145)

## 2023-05-10 LAB — HEMOGLOBIN AND HEMATOCRIT, BLOOD
HCT: 24.6 % — ABNORMAL LOW (ref 36.0–46.0)
Hemoglobin: 8.5 g/dL — ABNORMAL LOW (ref 12.0–15.0)

## 2023-05-10 LAB — PREPARE RBC (CROSSMATCH)

## 2023-05-10 MED ORDER — GABAPENTIN 100 MG PO CAPS
100.0000 mg | ORAL_CAPSULE | Freq: Three times a day (TID) | ORAL | Status: DC
  Administered 2023-05-10 – 2023-05-17 (×22): 100 mg via ORAL
  Filled 2023-05-10 (×22): qty 1

## 2023-05-10 MED ORDER — SODIUM CHLORIDE 0.9% IV SOLUTION: Freq: Once | INTRAVENOUS | Status: AC

## 2023-05-10 MED ORDER — HYDROCORTISONE 1 % EX CREA
1.0000 | TOPICAL_CREAM | Freq: Three times a day (TID) | CUTANEOUS | Status: DC | PRN
  Administered 2023-05-10 – 2023-05-16 (×2): 1 via TOPICAL
  Filled 2023-05-10: qty 28

## 2023-05-10 MED ORDER — POLYETHYLENE GLYCOL 3350 17 G PO PACK
17.0000 g | PACK | Freq: Two times a day (BID) | ORAL | Status: DC
  Administered 2023-05-10 – 2023-05-16 (×7): 17 g via ORAL
  Filled 2023-05-10 (×11): qty 1

## 2023-05-10 MED ORDER — SENNOSIDES-DOCUSATE SODIUM 8.6-50 MG PO TABS
1.0000 | ORAL_TABLET | Freq: Two times a day (BID) | ORAL | Status: DC
  Administered 2023-05-10 – 2023-05-16 (×11): 1 via ORAL
  Filled 2023-05-10 (×15): qty 1

## 2023-05-10 NOTE — Progress Notes (Signed)
 PT Cancellation Note  Patient Details Name: Sarah Castro MRN: [id number] DOB: Jun 17, 1940   Cancelled Treatment:    Reason Eval/Treat Not Completed: Medical issues which prohibited therapy this morning as pt receiving blood transfusion and RN recommends PT hold on evaluation until this afternoon. Will return as time/schedule allow.   Vickki Muff, PT, DPT   Acute Rehabilitation Department Office [phone] Secure Chat Communication Preferred   Ronnie Derby 05/10/2023, 9:40 AM

## 2023-05-10 NOTE — Evaluation (Signed)
 Physical Therapy Evaluation Patient Details Name: Sarah Castro MRN: [id number] DOB: 04-09-1940 Today's Date: 05/10/2023  History of Present Illness  Pt is a 83 y.o female admitted 05/08/23 from EMS for fall at home. C/o of bilateral hip pain. X-ray showed L proximal subtrochanteric femur fx, no pelvis fx.  IMN performed 4/2. WBAT  PMH: arthritis, hypercholesteremia, HTN, macular degeneration, CVA   Clinical Impression  Pt in bed upon arrival of PT, agreeable to evaluation at this time. Prior to admission the pt was independent, living alone, and using a cane for mobility during the day. The pt now presents with decreased strength in BLE, limited power, ROM, and activity tolerance as well as standing balance. The pt required significant assist to manage LE for bed mobility, and modA of 2 to complete sit-stand from EOB due to LE pain. Pt limited to small lateral steps along EOB this session before returning to supine due to fatigue. Given increased need for assistance and lack of available help at home, recommend continued inpatient rehab <3hours/day to facilitate return to maximal independence and to achieve pt's goal of walking again. Pt hoping for a facility near her daughter (in [place]).      If plan is discharge home, recommend the following: Two people to help with walking and/or transfers;Two people to help with bathing/dressing/bathroom;Assistance with cooking/housework;Direct supervision/assist for medications management;Direct supervision/assist for financial management;Assist for transportation;Help with stairs or ramp for entrance;Supervision due to cognitive status   Can travel by private vehicle   No    Equipment Recommendations None recommended by PT  Recommendations for Other Services       Functional Status Assessment Patient has had a recent decline in their functional status and demonstrates the ability to make significant improvements in function in a reasonable and  predictable amount of time.     Precautions / Restrictions Precautions Precautions: Fall Recall of Precautions/Restrictions: Intact Restrictions Weight Bearing Restrictions Per Provider Order: Yes LLE Weight Bearing Per Provider Order: Weight bearing as tolerated      Mobility  Bed Mobility Overal bed mobility: Needs Assistance Bed Mobility: Supine to Sit, Sit to Supine     Supine to sit: Mod assist, HOB elevated, Used rails Sit to supine: Mod assist, Used rails   General bed mobility comments: Heavy cues for sequencing reliant on bed features    Transfers Overall transfer level: Needs assistance Equipment used: Rolling walker (2 wheels) Transfers: Sit to/from Stand Sit to Stand: Mod assist, +2 safety/equipment, +2 physical assistance           General transfer comment: STS x2 improvement to mod of 1, side step completed toward [facility] cues for sequencing and manage RW    Ambulation/Gait Ambulation/Gait assistance: Mod assist, +2 safety/equipment Gait Distance (Feet): 3 Feet Assistive device: Rolling walker (2 wheels) Gait Pattern/deviations: Step-to pattern       General Gait Details: small lateral steps with R foot doing small heel-toe pivot and LLE stepping. pt unable to tolerate more than TDWB on LLE. limited to 3 steps along EOB     Balance Overall balance assessment: Needs assistance Sitting-balance support: Bilateral upper extremity supported, Feet supported Sitting balance-Leahy Scale: Fair     Standing balance support: Bilateral upper extremity supported, During functional activity, Reliant on assistive device for balance Standing balance-Leahy Scale: Poor Standing balance comment: dependent on BUE and external assist  Pertinent Vitals/Pain Pain Assessment Pain Assessment: Faces Faces Pain Scale: Hurts even more Pain Location: R hip, L surgical site Pain Descriptors / Indicators: Aching, Discomfort, Guarding,  Moaning Pain Intervention(s): Limited activity within patient's tolerance, Monitored during session, Premedicated before session, Repositioned    Home Living Family/patient expects to be discharged to:: Private residence Living Arrangements: Alone Available Help at Discharge: Neighbor;Friend(s);Family;Available 24 hours/day Type of Home: House Home Access: Stairs to enter Entrance Stairs-Rails: Left Entrance Stairs-Number of Steps: 1   Home Layout: Able to live on main level with bedroom/bathroom Home Equipment: Grab bars - tub/shower;Shower seat;Hand held shower head;Cane - single point;Cane - Programmer, applications (2 wheels);BSC/3in1      Prior Function Prior Level of Function : Independent/Modified Independent             Mobility Comments: Cane and RW at night ADLs Comments: pt reports independent     Extremity/Trunk Assessment   Upper Extremity Assessment Upper Extremity Assessment: Defer to OT evaluation RUE Deficits / Details: PMH: broken elbow decreased shoulder ROM, trigger finger RUE: Shoulder pain with ROM RUE Sensation: WNL RUE Coordination: decreased gross motor;decreased fine motor    Lower Extremity Assessment Lower Extremity Assessment: Generalized weakness;LLE deficits/detail;RLE deficits/detail RLE Deficits / Details: pt reports sore hip from laying on this side when down at home. grossly 4-/5 to MMT RLE Sensation: WNL RLE Coordination: WNL LLE Deficits / Details: limited by pain, pt reports sensation intact, pain at L ankle but movement WFL and reports no pain to palpation during eval. limited knee flexion and extension, LLE: Unable to fully assess due to pain LLE Sensation: WNL LLE Coordination: WNL    Cervical / Trunk Assessment Cervical / Trunk Assessment: Kyphotic  Communication   Communication Communication: No apparent difficulties    Cognition Arousal: Alert Behavior During Therapy: Anxious   PT - Cognitive impairments: No apparent  impairments                       PT - Cognition Comments: pt following commands, answering questions appropriately Following commands: Intact       Cueing Cueing Techniques: Verbal cues, Visual cues, Tactile cues     General Comments General comments (skin integrity, edema, etc.): daughter present and supportive    Exercises General Exercises - Lower Extremity Ankle Circles/Pumps: AROM, Both, 10 reps, Seated, Supine Long Arc Quad: AROM, Both, 5 reps, Seated Heel Slides: AROM, Right, 5 reps, Supine Hip ABduction/ADduction: AROM, 5 reps, Right, Supine Hip Flexion/Marching: AROM, Right, 5 reps, Seated Heel Raises: AROM, Both, 15 reps, Seated   Assessment/Plan    PT Assessment Patient needs continued PT services  PT Problem List Decreased strength;Decreased range of motion;Decreased activity tolerance;Decreased balance;Decreased mobility;Pain       PT Treatment Interventions DME instruction;Gait training;Stair training;Functional mobility training;Balance training;Therapeutic exercise;Therapeutic activities;Patient/family education    PT Goals (Current goals can be found in the Care Plan section)  Acute Rehab PT Goals Patient Stated Goal: return to walking PT Goal Formulation: With patient Time For Goal Achievement: 05/24/23 Potential to Achieve Goals: Good    Frequency Min 2X/week     Co-evaluation   Reason for Co-Treatment: To address functional/ADL transfers;For patient/therapist safety PT goals addressed during session: Mobility/safety with mobility;Balance;Proper use of DME;Strengthening/ROM OT goals addressed during session: ADL's and self-care;Strengthening/ROM       AM-PAC PT "6 Clicks" Mobility  Outcome Measure Help needed turning from your back to your side while in a flat bed without using bedrails?: A  Lot Help needed moving from lying on your back to sitting on the side of a flat bed without using bedrails?: A Lot Help needed moving to and from  a bed to a chair (including a wheelchair)?: A Lot Help needed standing up from a chair using your arms (e.g., wheelchair or bedside chair)?: A Lot Help needed to walk in hospital room?: Total Help needed climbing 3-5 steps with a railing? : Total 6 Click Score: 10    End of Session Equipment Utilized During Treatment: Gait belt Activity Tolerance: Patient limited by pain;Patient limited by fatigue Patient left: in bed;with call bell/phone within reach;with bed alarm set;with family/visitor present Nurse Communication: Mobility status PT Visit Diagnosis: Unsteadiness on feet (R26.81);Muscle weakness (generalized) (M62.81);History of falling (Z91.81);Pain Pain - Right/Left: Left Pain - part of body: Leg    Time: 1610-9604 PT Time Calculation (min) (ACUTE ONLY): 54 min   Charges:   PT Evaluation $PT Eval Moderate Complexity: 1 Mod PT Treatments $Therapeutic Exercise: 8-22 mins PT General Charges $$ ACUTE PT VISIT: 1 Visit         Vickki Muff, PT, DPT   Acute Rehabilitation Department Office [phone] Secure Chat Communication Preferred  Ronnie Derby 05/10/2023, 4:31 PM

## 2023-05-10 NOTE — Progress Notes (Signed)
 OT Cancellation Note  Patient Details Name: Sarah Castro MRN: [id number] DOB: Jul 09, 1940   Cancelled Treatment:    Reason Eval/Treat Not Completed: Patient at procedure or test/ unavailable. Per RN, pt about to receive blood transfusion, and requesting to return this afternoon. Will return as able to.   Ivor Messier, OT  Acute Rehabilitation Services Office [phone] Secure chat preferred   Marilynne Drivers 05/10/2023, 8:20 AM

## 2023-05-10 NOTE — Plan of Care (Signed)
  Problem: Education: Goal: Knowledge of General Education information will improve Description: Including pain rating scale, medication(s)/side effects and non-pharmacologic comfort measures Outcome: Progressing   Problem: Health Behavior/Discharge Planning: Goal: Ability to manage health-related needs will improve Outcome: Progressing   Problem: Clinical Measurements: Goal: Ability to maintain clinical measurements within normal limits will improve Outcome: Progressing Goal: Will remain free from infection Outcome: Progressing Goal: Respiratory complications will improve Outcome: Progressing Goal: Cardiovascular complication will be avoided Outcome: Progressing   Problem: Activity: Goal: Risk for activity intolerance will decrease Outcome: Progressing   Problem: Nutrition: Goal: Adequate nutrition will be maintained Outcome: Progressing   Problem: Coping: Goal: Level of anxiety will decrease Outcome: Progressing   Problem: Elimination: Goal: Will not experience complications related to bowel motility Outcome: Progressing Goal: Will not experience complications related to urinary retention Outcome: Progressing   Problem: Pain Managment: Goal: General experience of comfort will improve and/or be controlled Outcome: Progressing   Problem: Safety: Goal: Ability to remain free from injury will improve Outcome: Progressing   Problem: Skin Integrity: Goal: Risk for impaired skin integrity will decrease Outcome: Progressing

## 2023-05-10 NOTE — Progress Notes (Signed)
 Ankle-brachial index completed. Please see CV Procedures for preliminary results.  Shona Simpson, RVT 05/10/23 4:35 PM

## 2023-05-10 NOTE — Progress Notes (Signed)
 PROGRESS NOTE    Sarah Castro  RUE:[id number] DOB: Jun 16, 1940 DOA: 05/08/2023 PCP: Georgann Housekeeper, MD   Brief Narrative:  Sarah Castro is a 83 y.o. 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  Patient reports being on the ground overnight until neighbor checked on her the following morning.  Given worsening left hip pain and inability to walk patient was brought to our facility for further evaluation.  Imaging confirmed left femoral shaft fracture.  Hospitalist called for admission, orthopedics called in consult.  Assessment & Plan:   Principal Problem:   Closed displaced oblique fracture of shaft of left femur (HCC) Active Problems:   Ground-level fall   Normocytic anemia   Hyperkalemia  Left femoral shaft fracture fracture Clinical diagnosis osteoporosis Ground-level fall -Orthopedic surgery following, tolerated ORIF 05/09/23 while with cephalomedullary nailing of left intertrochanteric femur fracture -Pain management(IV Dilaudid, oxycodone, acetaminophen) DVT prophylaxis(Lovenox) per orthopedics -Continue vitamin D supplementation -PT/OT following, likely disposition SNF  Iron deficiency, chronic anemia, normocytic Concurrent acute blood loss anemia -Hemoglobin 9, near baseline at intake -History of iron deficiency but unable to tolerate iron well, discussed high iron diet -Iron panel unremarkable, B12 and folate within normal limits -Hemoglobin low normal yesterday, repeat this morning 5.8, 2 unit PRBC pending   Diminished pedal pulses Rule out peripheral arterial disease - RN to Doppler pulses - ABI bilateral pending   First-degree AV block Continue metoprolol, asymptomatic -PR interval 270 at intake   Chronic medical problems: History of prior CVA: Incidentally noted, not on aspirin, continue statin Hypertension: Continue metoprolol,  hold home olmesartan, doxazosin, clonidine Hyperlipidemia: Continue statin. Wet macular degeneration, blind OD (sequela of prior endophthalmitis): Outpatient follow-up Mood disorder: Continue home escitalopram  DVT prophylaxis: enoxaparin (LOVENOX) injection 40 mg Start: 05/10/23 0800 SCDs Start: 05/09/23 1759 SCDs Start: 05/08/23 1951 Code Status:   Code Status: Full Code Family Communication: None present  Status is: Inpatient  Dispo: The patient is from: Home              Anticipated d/c is to: To be determined, likely SNF              Anticipated d/c date is: 48 to 72 hours pending clinical and surgical course              Patient currently not medically stable for discharge given ongoing need for surgical intervention and postop evaluation  Consultants:  Orthopedic surgery  Procedures:  ORIF left femur 05/09/23  Antimicrobials:  Perioperatively  Subjective: No acute issues or events overnight, pain moderately improved but uncontrolled, no medications administered over the past 18 hours.  Otherwise denies nausea vomiting diarrhea constipation headache fevers chills chest pain.  Objective: Vitals:   05/09/23 1730 05/09/23 1757 05/09/23 2026 05/09/23 2120  BP: (!) 138/47 (!) 133/54 (!) 116/43   Pulse: 91 93 84   Resp: 13 18 17    Temp: 98 F (36.7 C) (!) 97.4 F (36.3 C) 98.1 F (36.7 C)   TempSrc:  Oral Oral   SpO2: 98% 100% 90% 94%  Weight:      Height:        Intake/Output Summary (Last 24 hours) at 05/10/2023 0722 Last data filed at 05/10/2023 0548 Gross per 24 hour  Intake 2073.28 ml  Output 400 ml  Net 1673.28 ml   Filed Weights   05/08/23 1511 05/09/23 1320  Weight: 59.9 kg 59.9 kg  Examination:  General:  Pleasantly resting in bed, No acute distress. HEENT:  Normocephalic atraumatic.  Sclerae nonicteric, noninjected. Neck:  Without mass or deformity.  Trachea is midline. Lungs:  Clear to auscultate bilaterally without rhonchi, wheeze, or  rales. Heart:  Regular rate and rhythm.  Without murmurs, rubs, or gallops. Abdomen:  Soft, nontender, nondistended.  Without guarding or rebound. Extremities: Left lower extremity range of motion decreased secondary to pain   Data Reviewed: I have personally reviewed following labs and imaging studies  CBC: Recent Labs  Lab 05/08/23 1629 05/09/23 0611 05/09/23 1834 05/09/23 1924  WBC 8.1 7.3 7.0  --   NEUTROABS 6.3  --   --   --   HGB 9.0* 8.7* 5.8* 7.0*  HCT 27.0* 25.2* 17.9* 21.6*  MCV 94.1 92.3 96.8  --   PLT 166 167 115*  --    Basic Metabolic Panel: Recent Labs  Lab 05/08/23 1629 05/09/23 0611 05/09/23 1834  NA 134* 136  --   K 5.0 4.3  --   CL 99 99  --   CO2 23 32  --   GLUCOSE 167* 113*  --   BUN 25* 30*  --   CREATININE 0.86 0.90 0.65  CALCIUM 8.7* 8.9  --   MG  --  2.1  --   PHOS  --  3.8  --    GFR: Estimated Creatinine Clearance: 45 mL/min (by C-G formula based on SCr of 0.65 mg/dL).  Coagulation Profile: Recent Labs  Lab 05/09/23 0611  INR 1.0   Cardiac Enzymes: Recent Labs  Lab 05/08/23 1953  CKTOTAL 4,446*   Anemia Panel: Recent Labs    05/09/23 0611  VITAMINB12 457  FOLATE 36.0  FERRITIN 103  TIBC 305  IRON 87   Recent Results (from the past 240 hours)  MRSA Next Gen by PCR, Nasal     Status: None   Collection Time: 05/09/23 11:34 AM   Specimen: Nasal Mucosa; Nasal Swab  Result Value Ref Range Status   MRSA by PCR Next Gen NOT DETECTED NOT DETECTED Final    Comment: (NOTE) The GeneXpert MRSA Assay (FDA approved for NASAL specimens only), is one component of a comprehensive MRSA colonization surveillance program. It is not intended to diagnose MRSA infection nor to guide or monitor treatment for MRSA infections. Test performance is not FDA approved in patients less than 28 years old. Performed at [facility], [address], [place], [place] [address]      Radiology Studies: DG FEMUR PORT MIN 2 VIEWS LEFT Result  Date: 05/09/2023 CLINICAL DATA:  Fracture, postop. EXAM: LEFT FEMUR PORTABLE 2 VIEWS COMPARISON:  Preoperative imaging FINDINGS: Femoral intramedullary nail with trans trochanteric and distal locking screw fixation traversing proximal femur fracture. Decreased angulation with improved alignment of fracture post fixation. Recent postsurgical change include air and edema in the soft tissues. Previous knee arthroplasty. IMPRESSION: ORIF of proximal femur fracture with improved alignment. Electronically Signed   By: Narda Rutherford M.D.   On: 05/09/2023 18:17   DG FEMUR MIN 2 VIEWS LEFT Result Date: 05/09/2023 CLINICAL DATA:  Elective surgery. EXAM: LEFT FEMUR 2 VIEWS COMPARISON:  Preoperative imaging FINDINGS: Nine/copy expose the of the left femur submitted from the operating room. Femoral intramedullary nail with trans trochanteric and distal locking screw fixation traversing proximal femur fracture. Fluoroscopy time 3 minutes 22 seconds. Dose 22.05 mGy. IMPRESSION: Intraoperative fluoroscopy during proximal femur fracture fixation. Electronically Signed   By: Ivette Loyal.D.  On: 05/09/2023 18:16   DG C-Arm 1-60 Min-No Report Result Date: 05/09/2023 Fluoroscopy was utilized by the requesting physician.  No radiographic interpretation.   DG C-Arm 1-60 Min-No Report Result Date: 05/09/2023 Fluoroscopy was utilized by the requesting physician.  No radiographic interpretation.   DG Femur Min 2 Views Left Result Date: 05/08/2023 CLINICAL DATA:  Status post fall with leg deformity EXAM: LEFT FEMUR 2 VIEWS; PELVIS - 1 VIEW COMPARISON:  Left hip radiograph dated 05/12/2020 FINDINGS: Oblique fracture of the proximal left femoral diaphysis extending to the greater trochanter with 2 shaft width posterior displacement and 3.2 cm foreshortening. No acute pelvic fracture or diastasis. Degenerative changes of the bilateral hips. Old right inferior pubic ramus fracture. Left knee arthroplasty hardware appears intact.  Soft tissues are unremarkable. IMPRESSION: Displaced oblique fracture of the proximal left femoral diaphysis extending to the greater trochanter. Electronically Signed   By: Agustin Cree M.D.   On: 05/08/2023 18:37   DG Pelvis 1-2 Views Result Date: 05/08/2023 CLINICAL DATA:  Status post fall with leg deformity EXAM: LEFT FEMUR 2 VIEWS; PELVIS - 1 VIEW COMPARISON:  Left hip radiograph dated 05/12/2020 FINDINGS: Oblique fracture of the proximal left femoral diaphysis extending to the greater trochanter with 2 shaft width posterior displacement and 3.2 cm foreshortening. No acute pelvic fracture or diastasis. Degenerative changes of the bilateral hips. Old right inferior pubic ramus fracture. Left knee arthroplasty hardware appears intact. Soft tissues are unremarkable. IMPRESSION: Displaced oblique fracture of the proximal left femoral diaphysis extending to the greater trochanter. Electronically Signed   By: Agustin Cree M.D.   On: 05/08/2023 18:37   DG Femur Min 2 Views Right Result Date: 05/08/2023 CLINICAL DATA:  Status post fall EXAM: RIGHT FEMUR 2 VIEWS COMPARISON:  Radiograph of the pelvis and left hip dated 05/08/2020 FINDINGS: There is no evidence of acute displaced fracture. Old inferior right pubic ramus fracture. Soft tissues are unremarkable. Vascular calcifications. Right knee arthroplasty hardware appears intact and well seated. IMPRESSION: No acute displaced right femoral fracture. Old inferior right pubic ramus fracture. Electronically Signed   By: Agustin Cree M.D.   On: 05/08/2023 18:30   Scheduled Meds:  cholecalciferol  1,000 Units Oral Daily   diphenhydrAMINE  50 mg Oral Once   docusate sodium  100 mg Oral BID   enoxaparin (LOVENOX) injection  40 mg Subcutaneous Q24H   escitalopram  10 mg Oral Daily   metoprolol succinate  25 mg Oral QPM   pravastatin  20 mg Oral q1800   sodium chloride flush  3 mL Intravenous Q12H   Continuous Infusions:  sodium chloride Stopped (05/10/23 0715)     ceFAZolin (ANCEF) IV 2 g (05/10/23 0541)     LOS: 2 days   Time spent:  Azucena Fallen, DO [facility]  If 7PM-7AM, please contact night-coverage [email or web address]  05/10/2023, 7:22 AM

## 2023-05-10 NOTE — Evaluation (Signed)
 Occupational Therapy Evaluation Patient Details Name: Sarah Castro MRN: [id number] DOB: 06/08/1940 Today's Date: 05/10/2023   History of Present Illness   Pt is a 83 y.o female admitted 05/08/23 from EMS for fall at home. C/o of bilateral hip pain. X-ray showed L proximal subtrochanteric femur fx, no pelvis fx.  IMN performed 4/2. WBAT  PMH: arthritis, hypercholesteremia, HTN, macular degeneration, CVA     Clinical Impressions Pt admitted based on above, and was seen based on problem list below. PTA pt was living alone and was independent with ADLs and light IADLs Today pt is requiring set up  to total for  ADLs. Bed mobility was mod assist, pt able to take side steps toward the head of the bed with mod +2  for safety and cueing for sequencing. Recommendation of <3 hours of skilled rehab daily to optimize independence levels. OT will continue to follow acutely to maximize functional independence.        If plan is discharge home, recommend the following:   Two people to help with walking and/or transfers;A lot of help with bathing/dressing/bathroom;Assistance with cooking/housework;Assist for transportation;Help with stairs or ramp for entrance     Functional Status Assessment   Patient has had a recent decline in their functional status and demonstrates the ability to make significant improvements in function in a reasonable and predictable amount of time.     Equipment Recommendations   Other (comment) (Defer to next venue)      Precautions/Restrictions   Precautions Precautions: Fall Recall of Precautions/Restrictions: Intact Restrictions Weight Bearing Restrictions Per Provider Order: Yes LLE Weight Bearing Per Provider Order: Weight bearing as tolerated     Mobility Bed Mobility Overal bed mobility: Needs Assistance Bed Mobility: Supine to Sit, Sit to Supine     Supine to sit: Mod assist, HOB elevated, Used rails Sit to supine: Mod assist, Used rails    General bed mobility comments: Heavy cues for sequencing reliant on bed features    Transfers Overall transfer level: Needs assistance Equipment used: Rolling walker (2 wheels) Transfers: Sit to/from Stand Sit to Stand: Mod assist, +2 safety/equipment, +2 physical assistance           General transfer comment: STS x2 improvement to mod of 1, side step completed toward [facility] cues for sequencing and manage RW      Balance Overall balance assessment: Needs assistance Sitting-balance support: Bilateral upper extremity supported, Feet supported Sitting balance-Leahy Scale: Fair     Standing balance support: Bilateral upper extremity supported, During functional activity, Reliant on assistive device for balance Standing balance-Leahy Scale: Poor             ADL either performed or assessed with clinical judgement   ADL Overall ADL's : Needs assistance/impaired Eating/Feeding: Set up;Sitting   Grooming: Set up;Sitting           Upper Body Dressing : Set up;Sitting   Lower Body Dressing: Total assistance;Sit to/from stand Lower Body Dressing Details (indicate cue type and reason): Pt unable to don socks,             Functional mobility during ADLs: Moderate assistance;Rolling walker (2 wheels);Cueing for sequencing General ADL Comments: Pain limiting pt     Vision Baseline Vision/History: 0 No visual deficits Vision Assessment?: No apparent visual deficits            Pertinent Vitals/Pain Pain Assessment Pain Assessment: Faces Faces Pain Scale: Hurts even more Pain Location: R hip, L surgical site Pain Descriptors /  Indicators: Aching, Discomfort, Guarding, Moaning Pain Intervention(s): Monitored during session, Premedicated before session, Repositioned     Extremity/Trunk Assessment Upper Extremity Assessment Upper Extremity Assessment: RUE deficits/detail;Left hand dominant;Generalized weakness RUE Deficits / Details: PMH: broken elbow decreased  shoulder ROM, trigger finger RUE: Shoulder pain with ROM RUE Sensation: WNL RUE Coordination: decreased gross motor;decreased fine motor   Lower Extremity Assessment Lower Extremity Assessment: Defer to PT evaluation       Communication Communication Communication: No apparent difficulties   Cognition Arousal: Alert Behavior During Therapy: Anxious Cognition: No apparent impairments             Following commands: Intact       Cueing  General Comments   Cueing Techniques: Verbal cues;Visual cues;Tactile cues  Daughter present for session           Home Living Family/patient expects to be discharged to:: Private residence Living Arrangements: Alone Available Help at Discharge: Neighbor;Friend(s);Family;Available 24 hours/day Type of Home: House Home Access: Stairs to enter [employer] of Steps: 1 Entrance Stairs-Rails: Left Home Layout: Able to live on main level with bedroom/bathroom     Bathroom Shower/Tub: Producer, television/film/video: Standard Bathroom Accessibility: Yes How Accessible: Accessible via walker Home Equipment: Grab bars - tub/shower;Shower seat;Hand held shower head;Cane - single point;Cane - Programmer, applications (2 wheels);BSC/3in1          Prior Functioning/Environment Prior Level of Function : Independent/Modified Independent     Mobility Comments: Cane and RW at night      OT Problem List: Decreased strength;Decreased range of motion;Decreased activity tolerance;Impaired balance (sitting and/or standing);Decreased safety awareness;Decreased knowledge of use of DME or AE   OT Treatment/Interventions: Self-care/ADL training;Therapeutic exercise;Energy conservation;DME and/or AE instruction;Therapeutic activities;Patient/family education;Balance training      OT Goals(Current goals can be found in the care plan section)   Acute Rehab OT Goals Patient Stated Goal: To go to rehab OT Goal Formulation: With  patient Time For Goal Achievement: 05/24/23 Potential to Achieve Goals: Good   OT Frequency:  Min 2X/week    Co-evaluation PT/OT/SLP Co-Evaluation/Treatment: Yes Reason for Co-Treatment: To address functional/ADL transfers;For patient/therapist safety   OT goals addressed during session: ADL's and self-care;Strengthening/ROM      AM-PAC OT "6 Clicks" Daily Activity     Outcome Measure Help from another person eating meals?: None Help from another person taking care of personal grooming?: A Little Help from another person toileting, which includes using toliet, bedpan, or urinal?: Total Help from another person bathing (including washing, rinsing, drying)?: A Lot Help from another person to put on and taking off regular upper body clothing?: A Little Help from another person to put on and taking off regular lower body clothing?: Total 6 Click Score: 14   End of Session Equipment Utilized During Treatment: Gait belt;Rolling walker (2 wheels) Nurse Communication: Mobility status  Activity Tolerance: Patient limited by pain Patient left: in bed;with call bell/phone within reach;with family/visitor present  OT Visit Diagnosis: Unsteadiness on feet (R26.81);Other abnormalities of gait and mobility (R26.89);History of falling (Z91.81);Muscle weakness (generalized) (M62.81)                Time: 1610-9604 OT Time Calculation (min): 55 min Charges:  OT General Charges $OT Visit: 1 Visit OT Evaluation $OT Eval Moderate Complexity: 1 Mod OT Treatments $Self Care/Home Management : 8-22 mins  Ivor Messier, OT  Acute Rehabilitation Services Office [phone] Secure chat preferred   Marilynne Drivers 05/10/2023, 3:27 PM

## 2023-05-10 NOTE — TOC CAGE-AID Note (Signed)
 Transition of Care [facility]) - CAGE-AID Screening  Patient Details  Name: Sarah Castro MRN: [id number] Date of Birth: 01-19-1941  Clinical Narrative:  Patient to ED after a fall. Patient reports minimal alcohol use, denies any illicit substance abuse. Patient denies need for resources at this time.  CAGE-AID Screening:   Have You Ever Felt You Ought to Cut Down on Your Drinking or Drug Use?: No Have People Annoyed You By Critizing Your Drinking Or Drug Use?: No Have You Felt Bad Or Guilty About Your Drinking Or Drug Use?: No Have You Ever Had a Drink or Used Drugs First Thing In The Morning to Steady Your Nerves or to Get Rid of a Hangover?: No CAGE-AID Score: 0  Substance Abuse Education Offered: No

## 2023-05-10 NOTE — Progress Notes (Signed)
 Orthopaedic Trauma Progress Note  SUBJECTIVE: Doing ok this morning. Not having much pain in the left hip. Having burning pain down in the ankle. We discussed this is likely nerve related given positioning during surgery and the fact she has been laying on her back for the last 24 hours. Will start Gabapentin today. No lightheadedness or dizziness. No chest pain. No SOB. No nausea/vomiting. No other complaints. Tolerating diet and fluids. Daughter at bedside  OBJECTIVE:  Vitals:   05/09/23 2120 05/10/23 0758  BP:  (!) 121/41  Pulse:  67  Resp:  18  Temp:  99.4 F (37.4 C)  SpO2: 94% 95%    General: Sitting up in bed, NAD Respiratory: No increased work of breathing.  LLE: Dressing CDI. Sore over the hip as expected, ankle DF/PF intact. +DP pulse  IMAGING: Stable post op imaging.   LABS:  Results for orders placed or performed during the hospital encounter of 05/08/23 (from the past 24 hours)  MRSA Next Gen by PCR, Nasal     Status: None   Collection Time: 05/09/23 11:34 AM   Specimen: Nasal Mucosa; Nasal Swab  Result Value Ref Range   MRSA by PCR Next Gen NOT DETECTED NOT DETECTED  CBC     Status: Abnormal   Collection Time: 05/09/23  6:34 PM  Result Value Ref Range   WBC 7.0 4.0 - 10.5 K/uL   RBC 1.85 (L) 3.87 - 5.11 MIL/uL   Hemoglobin 5.8 (LL) 12.0 - 15.0 g/dL   HCT 01.0 (L) 27.2 - 53.6 %   MCV 96.8 80.0 - 100.0 fL   MCH 31.4 26.0 - 34.0 pg   MCHC 32.4 30.0 - 36.0 g/dL   RDW 64.4 03.4 - 74.2 %   Platelets 115 (L) 150 - 400 K/uL   nRBC 0.0 0.0 - 0.2 %  Creatinine, serum     Status: None   Collection Time: 05/09/23  6:34 PM  Result Value Ref Range   Creatinine, Ser 0.65 0.44 - 1.00 mg/dL   GFR, Estimated >59 >56 mL/min  Hemoglobin and hematocrit, blood     Status: Abnormal   Collection Time: 05/09/23  7:24 PM  Result Value Ref Range   Hemoglobin 7.0 (L) 12.0 - 15.0 g/dL   HCT 38.7 (L) 56.4 - 33.2 %  CBC     Status: Abnormal   Collection Time: 05/10/23  6:13 AM   Result Value Ref Range   WBC 7.6 4.0 - 10.5 K/uL   RBC 1.87 (L) 3.87 - 5.11 MIL/uL   Hemoglobin 5.9 (LL) 12.0 - 15.0 g/dL   HCT 95.1 (L) 88.4 - 16.6 %   MCV 95.2 80.0 - 100.0 fL   MCH 31.6 26.0 - 34.0 pg   MCHC 33.1 30.0 - 36.0 g/dL   RDW 06.3 01.6 - 01.0 %   Platelets 147 (L) 150 - 400 K/uL   nRBC 0.0 0.0 - 0.2 %  Basic metabolic panel with GFR     Status: Abnormal   Collection Time: 05/10/23  6:13 AM  Result Value Ref Range   Sodium 133 (L) 135 - 145 mmol/L   Potassium 4.7 3.5 - 5.1 mmol/L   Chloride 98 98 - 111 mmol/L   CO2 25 22 - 32 mmol/L   Glucose, Bld 144 (H) 70 - 99 mg/dL   BUN 25 (H) 8 - 23 mg/dL   Creatinine, Ser 9.32 0.44 - 1.00 mg/dL   Calcium 7.9 (L) 8.9 - 10.3 mg/dL   GFR, Estimated >  60 >60 mL/min   Anion gap 10 5 - 15  Prepare RBC (crossmatch)     Status: None   Collection Time: 05/10/23  7:49 AM  Result Value Ref Range   Order Confirmation      ORDER PROCESSED BY BLOOD BANK Performed at [facility], [address], [place], [place] [address]     ASSESSMENT: Sarah Castro is a 83 y.o. female, 1 Day Post-Op s/p INTRAMEDULLARY NAIL LEFT INTERTROCHANTERIC FEMUR FRACTURE  CV/Blood loss: Acute blood loss anemia, Hgb 5.9 this AM. 2 units PRBCs ordered per primary team. Hemodynamically stable  PLAN: Weightbearing: WBAT LLE ROM:  Ok for unrestricted ROM  Incisional and dressing care: Reinforce dressings as needed  Showering:  Ok to begin getting incisions wet 05/12/23 Orthopedic device(s): None  Pain management:  1. Tylenol 1000 mg q 6 hours PRN 2. Oxycodone 2.5-5 mg q 4 hours PRN 3. Dilaudid 0.5 mg q 4 hours PRN 4. Neurontin 100 mg TID VTE prophylaxis: Lovenox, SCDs ID:  Ancef 2gm post op Foley/Lines:  No foley, KVO IVFs Impediments to Fracture Healing: Vit d level 86, no additional supplementation needed Dispo: PT/OT eval today. Will probably need SNF. Plan to change LLE dressing tomorrow 05/11/23. Okay for discharge from ortho standpoint once  cleared by medicine team and therapies  D/C recommendations: - Oxycodone, Tylenol for pain control - Eliquis 2.5 mg BID x 30 days for DVT prophylaxis - No additional need for Vit D supplementation  Follow - up plan: 2 weeks   Contact information:  Truitt Merle MD, Thyra Breed PA-C. After hours and holidays please check [email or web address] for group call information for [facility]   Thompson Caul, PA-C [phone] (office) [email or web address]

## 2023-05-11 DIAGNOSIS — W1830XA Fall on same level, unspecified, initial encounter: Secondary | ICD-10-CM | POA: Diagnosis not present

## 2023-05-11 DIAGNOSIS — S72332A Displaced oblique fracture of shaft of left femur, initial encounter for closed fracture: Secondary | ICD-10-CM | POA: Diagnosis not present

## 2023-05-11 DIAGNOSIS — D649 Anemia, unspecified: Secondary | ICD-10-CM | POA: Diagnosis not present

## 2023-05-11 LAB — TYPE AND SCREEN
ABO/RH(D): A POS
Antibody Screen: NEGATIVE
Unit division: 0
Unit division: 0

## 2023-05-11 LAB — CBC
HCT: 23.8 % — ABNORMAL LOW (ref 36.0–46.0)
Hemoglobin: 8 g/dL — ABNORMAL LOW (ref 12.0–15.0)
MCH: 30.8 pg (ref 26.0–34.0)
MCHC: 33.6 g/dL (ref 30.0–36.0)
MCV: 91.5 fL (ref 80.0–100.0)
Platelets: 138 10*3/uL — ABNORMAL LOW (ref 150–400)
RBC: 2.6 MIL/uL — ABNORMAL LOW (ref 3.87–5.11)
RDW: 14 % (ref 11.5–15.5)
WBC: 7.2 10*3/uL (ref 4.0–10.5)
nRBC: 0.3 % — ABNORMAL HIGH (ref 0.0–0.2)

## 2023-05-11 LAB — BPAM RBC
Blood Product Expiration Date: 202505012359
Blood Product Expiration Date: 202505012359
ISSUE DATE / TIME: 202504031017
ISSUE DATE / TIME: 202504031228
Unit Type and Rh: 6200
Unit Type and Rh: 6200

## 2023-05-11 LAB — BASIC METABOLIC PANEL WITH GFR
Anion gap: 8 (ref 5–15)
BUN: 19 mg/dL (ref 8–23)
CO2: 26 mmol/L (ref 22–32)
Calcium: 7.8 mg/dL — ABNORMAL LOW (ref 8.9–10.3)
Chloride: 100 mmol/L (ref 98–111)
Creatinine, Ser: 0.54 mg/dL (ref 0.44–1.00)
GFR, Estimated: >60 mL/min (ref >60)
Glucose, Bld: 129 mg/dL — ABNORMAL HIGH (ref 70–99)
Potassium: 4.1 mmol/L (ref 3.5–5.1)
Sodium: 134 mmol/L — ABNORMAL LOW (ref 135–145)

## 2023-05-11 MED ORDER — ACETAMINOPHEN 500 MG PO TABS: 1000.0000 mg | ORAL_TABLET | Freq: Four times a day (QID) | ORAL | Status: AC | PRN

## 2023-05-11 MED ORDER — METHOCARBAMOL 500 MG PO TABS
500.0000 mg | ORAL_TABLET | Freq: Once | ORAL | Status: AC
  Administered 2023-05-11: 500 mg via ORAL
  Filled 2023-05-11: qty 1

## 2023-05-11 MED ORDER — MAGNESIUM CITRATE PO SOLN
1.0000 | Freq: Once | ORAL | Status: AC
  Administered 2023-05-11: 1 via ORAL
  Filled 2023-05-11: qty 296

## 2023-05-11 MED ORDER — GABAPENTIN 100 MG PO CAPS: 100.0000 mg | ORAL_CAPSULE | Freq: Three times a day (TID) | ORAL | 0 refills | Status: AC

## 2023-05-11 MED ORDER — APIXABAN 2.5 MG PO TABS: 2.5000 mg | ORAL_TABLET | Freq: Two times a day (BID) | ORAL | 0 refills | Status: AC

## 2023-05-11 MED ORDER — OXYCODONE HCL 5 MG PO TABS: 2.5000 mg | ORAL_TABLET | ORAL | 0 refills | Status: AC | PRN

## 2023-05-11 NOTE — Progress Notes (Addendum)
 PROGRESS NOTE    Sarah Castro  ZHY:[id number] DOB: 1940-08-01 DOA: 05/08/2023 PCP: Georgann Housekeeper, MD   Brief Narrative:  Sarah Castro is a 83 y.o. 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  Patient reports being on the ground overnight until neighbor checked on her the following morning.  Given worsening left hip pain and inability to walk patient was brought to our facility for further evaluation.  Imaging confirmed left femoral shaft fracture.  Hospitalist called for admission, orthopedics called in consult.  Assessment & Plan:   Principal Problem:   Closed displaced oblique fracture of shaft of left femur (HCC) Active Problems:   Ground-level fall   Normocytic anemia   Hyperkalemia  Left femoral shaft fracture fracture Clinical diagnosis osteoporosis Ground-level fall -Orthopedic surgery following, tolerated ORIF 05/09/23 while with cephalomedullary nailing of left intertrochanteric femur fracture -Pain management(IV Dilaudid, oxycodone, acetaminophen) DVT prophylaxis(Lovenox) per orthopedics -Continue vitamin D supplementation -PT/OT following, likely disposition SNF  Iron deficiency, chronic anemia, normocytic Concurrent acute blood loss anemia -Hemoglobin 9, near baseline at intake -History of iron deficiency but unable to tolerate iron well, discussed high iron diet -Iron panel unremarkable, B12 and folate within normal limits -No melena/bruising/bleeding sources identified thus far -Status post 2u PRBC - hgb trending up appropriately - currently 8.5   Diminished pedal pulses Rule out peripheral arterial disease - RN to Doppler pulses - ABI unfortunately a poor study - unable to tolerate L sided evaluation; R side shows non-compressible vessels   First-degree AV block Continue metoprolol, asymptomatic -PR interval 270 at intake    Chronic medical problems: History of prior CVA: Incidentally noted, not on aspirin, continue statin Hypertension: Continue metoprolol, hold home olmesartan, doxazosin, clonidine Hyperlipidemia: Continue statin. Wet macular degeneration, blind OD (sequela of prior endophthalmitis): Outpatient follow-up Mood disorder: Continue home escitalopram  DVT prophylaxis: enoxaparin (LOVENOX) injection 40 mg Start: 05/10/23 0800 SCDs Start: 05/08/23 1951 Code Status:   Code Status: Full Code Family Communication: None present  Status is: Inpatient  Dispo: The patient is from: Home              Anticipated d/c is to: To be determined, likely SNF              Anticipated d/c date is: 48 to 72 hours pending clinical and surgical course              Patient currently not medically stable for discharge given ongoing need for surgical intervention and postop evaluation  Consultants:  Orthopedic surgery  Procedures:  ORIF left femur 05/09/23  Antimicrobials:  Perioperatively  Subjective: No acute issues or events overnight, reports she has slept well compared to prior nights. Foot pain ongoing but improving, otherwise denies nausea vomiting diarrhea constipation headache fevers chills chest pain.  Objective: Vitals:   05/10/23 1505 05/10/23 1638 05/10/23 2003 05/11/23 0445  BP: 138/60 (!) 136/53 (!) 139/48 (!) 139/56  Pulse: 74 66 65 60  Resp: 18 18 16 16   Temp: 98.4 F (36.9 C) 98.4 F (36.9 C) 98.3 F (36.8 C) 97.7 F (36.5 C)  TempSrc: Oral  Oral Oral  SpO2: 93% 98% 98% 90%  Weight:      Height:        Intake/Output Summary (Last 24 hours) at 05/11/2023 0557 Last data filed at 05/10/2023 2231 Gross per 24 hour  Intake 1214 ml  Output 300 ml  Net 914  ml   Filed Weights   05/08/23 1511 05/09/23 1320  Weight: 59.9 kg 59.9 kg    Examination:  General:  Pleasantly resting in bed, No acute distress. HEENT:  Normocephalic atraumatic.  Sclerae nonicteric, noninjected. Neck:   Without mass or deformity.  Trachea is midline. Lungs:  Clear to auscultate bilaterally without rhonchi, wheeze, or rales. Heart:  Regular rate and rhythm.  Without murmurs, rubs, or gallops. Abdomen:  Soft, nontender, nondistended.  Without guarding or rebound. Extremities: Left lower extremity range of motion decreased secondary to pain   Data Reviewed: I have personally reviewed following labs and imaging studies  CBC: Recent Labs  Lab 05/08/23 1629 05/09/23 0611 05/09/23 1834 05/09/23 1924 05/10/23 0613 05/10/23 1901  WBC 8.1 7.3 7.0  --  7.6  --   NEUTROABS 6.3  --   --   --   --   --   HGB 9.0* 8.7* 5.8* 7.0* 5.9* 8.5*  HCT 27.0* 25.2* 17.9* 21.6* 17.8* 24.6*  MCV 94.1 92.3 96.8  --  95.2  --   PLT 166 167 115*  --  147*  --    Basic Metabolic Panel: Recent Labs  Lab 05/08/23 1629 05/09/23 0611 05/09/23 1834 05/10/23 0613  NA 134* 136  --  133*  K 5.0 4.3  --  4.7  CL 99 99  --  98  CO2 23 32  --  25  GLUCOSE 167* 113*  --  144*  BUN 25* 30*  --  25*  CREATININE 0.86 0.90 0.65 0.69  CALCIUM 8.7* 8.9  --  7.9*  MG  --  2.1  --   --   PHOS  --  3.8  --   --    GFR: Estimated Creatinine Clearance: 45 mL/min (by C-G formula based on SCr of 0.69 mg/dL).  Coagulation Profile: Recent Labs  Lab 05/09/23 0611  INR 1.0   Cardiac Enzymes: Recent Labs  Lab 05/08/23 1953  CKTOTAL 4,446*   Anemia Panel: Recent Labs    05/09/23 0611  VITAMINB12 457  FOLATE 36.0  FERRITIN 103  TIBC 305  IRON 87   Recent Results (from the past 240 hours)  MRSA Next Gen by PCR, Nasal     Status: None   Collection Time: 05/09/23 11:34 AM   Specimen: Nasal Mucosa; Nasal Swab  Result Value Ref Range Status   MRSA by PCR Next Gen NOT DETECTED NOT DETECTED Final    Comment: (NOTE) The GeneXpert MRSA Assay (FDA approved for NASAL specimens only), is one component of a comprehensive MRSA colonization surveillance program. It is not intended to diagnose MRSA infection nor to  guide or monitor treatment for MRSA infections. Test performance is not FDA approved in patients less than 70 years old. Performed at [facility], [address] [place], [place] [address]      Radiology Studies: VAS [place] ABI WITH/WO TBI Result Date: 05/10/2023  LOWER EXTREMITY DOPPLER STUDY Patient Name:  RANEY ANTWINE  Date of Exam:   05/10/2023 Medical Rec #: [id number]           Accession #:    [id number] Date of Birth: [birth year]          Patient Gender: F Patient Age:   25 years Exam Location:  [facility] Procedure:      VAS [place] ABI WITH/WO TBI Referring Phys: Dolly Rias --------------------------------------------------------------------------------  Indications: Peripheral artery disease. High Risk Factors: Hypertension, hyperlipidemia, prior CVA. Other Factors:  Recent fall.  Limitations: Today's exam was limited due to patient intolerant to cuff              pressure. Comparison Study: None Performing Technologist: Shona Simpson  Examination Guidelines: A complete evaluation includes at minimum, Doppler waveform signals and systolic blood pressure reading at the level of bilateral brachial, anterior tibial, and posterior tibial arteries, when vessel segments are accessible. Bilateral testing is considered an integral part of a complete examination. Photoelectric Plethysmograph (PPG) waveforms and toe systolic pressure readings are included as required and additional duplex testing as needed. Limited examinations for reoccurring indications may be performed as noted.  ABI Findings: +--------+------------------+-----+----------+--------+ Right   Rt Pressure (mmHg)IndexWaveform  Comment  +--------+------------------+-----+----------+--------+ [id number]                    triphasic          +--------+------------------+-----+----------+--------+ PTA     249               1.54 monophasic         +--------+------------------+-----+----------+--------+ DP       162               1.00 monophasic         +--------+------------------+-----+----------+--------+ +--------+------------------+-----+----------+---------------------------+ Left    Lt Pressure (mmHg)IndexWaveform  Comment                     +--------+------------------+-----+----------+---------------------------+ [id number]                    triphasic                             +--------+------------------+-----+----------+---------------------------+ PTA                            monophasicIntolerant to cuff pressure +--------+------------------+-----+----------+---------------------------+ DP                             monophasicIntolerant to cuff pressure +--------+------------------+-----+----------+---------------------------+ +-------+-----------+-----------+------------+------------+ ABI/TBIToday's ABIToday's TBIPrevious ABIPrevious TBI +-------+-----------+-----------+------------+------------+ Right  [place]                                             +-------+-----------+-----------+------------+------------+ Left   1.00                                           +-------+-----------+-----------+------------+------------+  Summary: Right: Resting right ankle-brachial index indicates noncompressible right lower extremity arteries. Left: Unable to aquire pressures. *See table(s) above for measurements and observations.     Preliminary    DG FEMUR PORT MIN 2 VIEWS LEFT Result Date: 05/09/2023 CLINICAL DATA:  Fracture, postop. EXAM: LEFT FEMUR PORTABLE 2 VIEWS COMPARISON:  Preoperative imaging FINDINGS: Femoral intramedullary nail with trans trochanteric and distal locking screw fixation traversing proximal femur fracture. Decreased angulation with improved alignment of fracture post fixation. Recent postsurgical change include air and edema in the soft tissues. Previous knee arthroplasty. IMPRESSION: ORIF of proximal femur fracture with improved alignment.  Electronically Signed   By: Narda Rutherford M.D.   On: 05/09/2023 18:17   DG FEMUR MIN 2 VIEWS LEFT Result Date: 05/09/2023  CLINICAL DATA:  Elective surgery. EXAM: LEFT FEMUR 2 VIEWS COMPARISON:  Preoperative imaging FINDINGS: Nine/copy expose the of the left femur submitted from the operating room. Femoral intramedullary nail with trans trochanteric and distal locking screw fixation traversing proximal femur fracture. Fluoroscopy time 3 minutes 22 seconds. Dose 22.05 mGy. IMPRESSION: Intraoperative fluoroscopy during proximal femur fracture fixation. Electronically Signed   By: Narda Rutherford M.D.   On: 05/09/2023 18:16   DG C-Arm 1-60 Min-No Report Result Date: 05/09/2023 Fluoroscopy was utilized by the requesting physician.  No radiographic interpretation.   DG C-Arm 1-60 Min-No Report Result Date: 05/09/2023 Fluoroscopy was utilized by the requesting physician.  No radiographic interpretation.   Scheduled Meds:  cholecalciferol  1,000 Units Oral Daily   diphenhydrAMINE  50 mg Oral Once   enoxaparin (LOVENOX) injection  40 mg Subcutaneous Q24H   escitalopram  10 mg Oral Daily   gabapentin  100 mg Oral TID   metoprolol succinate  25 mg Oral QPM   polyethylene glycol  17 g Oral BID   pravastatin  20 mg Oral q1800   senna-docusate  1 tablet Oral BID   sodium chloride flush  3 mL Intravenous Q12H   Continuous Infusions:     LOS: 3 days   Time spent:  Azucena Fallen, DO [facility]  If 7PM-7AM, please contact night-coverage [email or web address]  05/11/2023, 5:57 AM

## 2023-05-11 NOTE — Progress Notes (Signed)
 RE:  Sarah Castro      Date of Birth:  20-Nov-2040     Date:   05/11/23       To Whom It May Concern:  Please be advised that the above-named patient will require a short-term nursing home stay - anticipated 30 days or less for rehabilitation and strengthening.  The plan is for return home.                 MD signature                Date

## 2023-05-11 NOTE — [place] FL2 (Signed)
 [place] MEDICAID FL2 LEVEL OF CARE FORM     IDENTIFICATION  Patient Name: Sarah Castro Birthdate: Jul 02, 1940 Sex: female Admission Date (Current Location): 05/08/2023  [facility] and [place] Number:  Producer, television/film/video and Address:  The [place], [address], [place], [place] [address]      Provider Number: [id number]  Attending Physician Name and Address:  Azucena Fallen, MD  Relative Name and Phone Number:  Roper,Kristy Daughter [phone]    Current Level of Care: Hospital Recommended Level of Care: Skilled Nursing Facility Prior Approval Number:    Date Approved/Denied:   PASRR Number:    Discharge Plan: SNF    Current Diagnoses: Patient Active Problem List   Diagnosis Date Noted   Closed displaced oblique fracture of shaft of left femur (HCC) 05/08/2023   Ground-level fall 05/08/2023   Normocytic anemia 05/08/2023   Hyperkalemia 05/08/2023   S/P total knee arthroplasty, 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   S/P total knee replacement, 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   Tibialis posterior tendon tear, nontraumatic, right 08/07/2022   Peripheral edema 12/27/2020   Gait abnormality 07/22/2020   Cerebral vascular disease 07/22/2020   Left shoulder pain 06/02/2020   Abnormal finding on MRI of brain 06/02/2020   Left rib fracture 05/13/2020   T12 compression fracture (HCC) 10/14/2019   Rotator cuff arthropathy of right shoulder 10/08/2018   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   Displaced comminuted supracondylar fracture without intercondylar fracture of right humerus, initial encounter for closed fracture 01/10/2018   Spinal stenosis 06/12/2016   Neurogenic claudication 06/12/2016   Lumbar radiculopathy 12/08/2015   Degenerative arthritis of left knee 10/27/2015   Piriformis syndrome of right side 01/11/2015   Greater trochanteric bursitis of right hip 11/17/2014   Toenail fungus 08/04/2014   Pes planus of both feet 08/22/2013   Primary localized  osteoarthrosis, lower leg 07/25/2013   Expected blood loss anemia 03/19/2012   S/P right TKA 03/18/2012    Orientation RESPIRATION BLADDER Height & Weight     Self, Time, Situation, Place  Normal Continent Weight: 132 lb (59.9 kg) Height:  5\' 1"  (154.9 cm)  BEHAVIORAL SYMPTOMS/MOOD NEUROLOGICAL BOWEL NUTRITION STATUS      Continent Diet (see discharge summary)  AMBULATORY STATUS COMMUNICATION OF NEEDS Skin   Limited Assist Verbally Surgical wounds                       Personal Care Assistance Level of Assistance  Bathing, Feeding, Dressing, Total care Bathing Assistance: Maximum assistance Feeding assistance: Limited assistance Dressing Assistance: Maximum assistance Total Care Assistance: Maximum assistance   Functional Limitations Info  Sight, Hearing, Speech Sight Info: Adequate Hearing Info: Adequate Speech Info: Adequate    SPECIAL CARE FACTORS FREQUENCY  PT (By licensed PT), OT (By licensed OT)     PT Frequency: 5x week OT Frequency: 5x week            Contractures Contractures Info: Not present    Additional Factors Info  Code Status, Allergies Code Status Info: full Allergies Info: Amlodipine, Garlic, Hydrocodone, Sulfa Antibiotics           Current Medications (05/11/2023):  This is the current hospital active medication list Current Facility-Administered Medications  Medication Dose Route Frequency Provider Last Rate Last Admin   acetaminophen (TYLENOL) tablet 1,000 mg  1,000 mg Oral Q6H PRN Sharon Seller, Sarah A, PA-C       cholecalciferol (VITAMIN D3) 25 MCG (1000 UNIT) tablet 1,000 Units  1,000  Units Oral Daily West Bali, PA-C   1,000 Units at 05/11/23 1610   diphenhydrAMINE (BENADRYL) capsule 50 mg  50 mg Oral Once West Bali, PA-C       enoxaparin (LOVENOX) injection 40 mg  40 mg Subcutaneous Q24H West Bali, PA-C   40 mg at 05/11/23 0950   escitalopram (LEXAPRO) tablet 10 mg  10 mg Oral Daily West Bali, PA-C   10 mg at  05/11/23 9604   gabapentin (NEURONTIN) capsule 100 mg  100 mg Oral TID West Bali, PA-C   100 mg at 05/11/23 5409   hydrocortisone cream 1 % 1 Application  1 Application Topical TID PRN Azucena Fallen, MD   1 Application at 05/10/23 0401   HYDROmorphone (DILAUDID) injection 0.5 mg  0.5 mg Intravenous Q4H PRN West Bali, PA-C   0.5 mg at 05/10/23 2255   melatonin tablet 6 mg  6 mg Oral QHS PRN West Bali, PA-C   6 mg at 05/11/23 0012   metoCLOPramide (REGLAN) tablet 5-10 mg  5-10 mg Oral Q8H PRN West Bali, PA-C       Or   metoCLOPramide (REGLAN) injection 5-10 mg  5-10 mg Intravenous Q8H PRN West Bali, PA-C       metoprolol succinate (TOPROL-XL) 24 hr tablet 25 mg  25 mg Oral QPM Thyra Breed A, PA-C   25 mg at 05/10/23 1725   ondansetron (ZOFRAN) injection 4 mg  4 mg Intravenous Q6H PRN Thyra Breed A, PA-C       oxyCODONE (Oxy IR/ROXICODONE) immediate release tablet 2.5 mg  2.5 mg Oral Q4H PRN West Bali, PA-C   2.5 mg at 05/10/23 8119   Or   oxyCODONE (Oxy IR/ROXICODONE) immediate release tablet 5 mg  5 mg Oral Q4H PRN West Bali, PA-C   5 mg at 05/11/23 1334   polyethylene glycol (MIRALAX / GLYCOLAX) packet 17 g  17 g Oral BID Azucena Fallen, MD   17 g at 05/11/23 1478   pravastatin (PRAVACHOL) tablet 20 mg  20 mg Oral [id number] West Bali, PA-C   20 mg at 05/10/23 1725   senna-docusate (Senokot-S) tablet 1 tablet  1 tablet Oral BID Azucena Fallen, MD   1 tablet at 05/11/23 1308   sodium chloride flush (NS) 0.9 % injection 3 mL  3 mL Intravenous Q12H West Bali, PA-C   3 mL at 05/11/23 1017     Discharge Medications: Please see discharge summary for a list of discharge medications.  Relevant Imaging Results:  Relevant Lab Results:   Additional Information SSN: [id number]  Lorri Frederick, LCSW

## 2023-05-11 NOTE — Care Management Important Message (Signed)
 Important Message  Patient Details  Name: Sarah Castro MRN: [id number] Date of Birth: 20-Mar-1940   Important Message Given:  Yes - Medicare IM     Dorena Bodo 05/11/2023, 2:45 PM

## 2023-05-11 NOTE — Progress Notes (Addendum)
 Orthopaedic Trauma Progress Note  SUBJECTIVE: Doing okay today, pain manageable.  Notes that her Mepilex dressings feel itchy.  Noted that therapies went decent yesterday.  No lightheadedness or dizziness. No chest pain. No SOB. No nausea/vomiting. No other complaints. Tolerating diet and fluids. Daughter at bedside.  Patient's daughter lives in [place], asking about rehab options in [place] as well as in [place].  Patient and daughter also both expressed interest in CIR.  We briefly discussed the patient may not be able to tolerate intensity level of CIR which the daughter understands.  OBJECTIVE:  Vitals:   05/11/23 0746 05/11/23 1257  BP: (!) 165/58 (!) 155/64  Pulse: (!) 59 62  Resp: 16 16  Temp: 98 F (36.7 C) 98.1 F (36.7 C)  SpO2: 91% 93%    General: Sitting up in bed, NAD Respiratory: No increased work of breathing.  LLE: Dressings removed, incisions are clean, dry, intact with Dermabond in place.  Notable bruising and swelling over the hip and throughout the lateral thigh as expected.  Ankle DF/PF intact. +DP pulse  IMAGING: Stable post op imaging.   LABS:  Results for orders placed or performed during the hospital encounter of 05/08/23 (from the past 24 hours)  Hemoglobin and hematocrit, blood     Status: Abnormal   Collection Time: 05/10/23  7:01 PM  Result Value Ref Range   Hemoglobin 8.5 (L) 12.0 - 15.0 g/dL   HCT 16.1 (L) 09.6 - 04.5 %  Basic metabolic panel with GFR     Status: Abnormal   Collection Time: 05/11/23  5:29 AM  Result Value Ref Range   Sodium 134 (L) 135 - 145 mmol/L   Potassium 4.1 3.5 - 5.1 mmol/L   Chloride 100 98 - 111 mmol/L   CO2 26 22 - 32 mmol/L   Glucose, Bld 129 (H) 70 - 99 mg/dL   BUN 19 8 - 23 mg/dL   Creatinine, Ser 4.09 0.44 - 1.00 mg/dL   Calcium 7.8 (L) 8.9 - 10.3 mg/dL   GFR, Estimated >81 >19 mL/min   Anion gap 8 5 - 15  CBC     Status: Abnormal   Collection Time: 05/11/23  5:29 AM  Result Value Ref Range   WBC 7.2 4.0 -  10.5 K/uL   RBC 2.60 (L) 3.87 - 5.11 MIL/uL   Hemoglobin 8.0 (L) 12.0 - 15.0 g/dL   HCT 14.7 (L) 82.9 - 56.2 %   MCV 91.5 80.0 - 100.0 fL   MCH 30.8 26.0 - 34.0 pg   MCHC 33.6 30.0 - 36.0 g/dL   RDW 13.0 86.5 - 78.4 %   Platelets 138 (L) 150 - 400 K/uL   nRBC 0.3 (H) 0.0 - 0.2 %    ASSESSMENT: Sarah Castro is a 83 y.o. female, 2 Days Post-Op s/p INTRAMEDULLARY NAIL LEFT INTERTROCHANTERIC FEMUR FRACTURE  CV/Blood loss: Acute blood loss anemia, Hgb 8.0 this AM. Received 2 units PRBCs 05/10/23. Hemodynamically stable  PLAN: Weightbearing: WBAT LLE ROM:  Ok for unrestricted ROM  Incisional and dressing care: Ok to leave incisions open to air Showering:  Ok to begin getting incisions wet 05/12/23 Orthopedic device(s): None  Pain management:  1. Tylenol 1000 mg q 6 hours PRN 2. Oxycodone 2.5-5 mg q 4 hours PRN 3. Dilaudid 0.5 mg q 4 hours PRN 4. Neurontin 100 mg TID VTE prophylaxis: Lovenox, SCDs ID:  Ancef 2gm post op Foley/Lines:  No foley, KVO IVFs Impediments to Fracture Healing: Vit D level 86,  continue home dose. No additional supplementation needed Dispo: PT/OT eval ongoing, recommending SNF. Okay for discharge from ortho standpoint once cleared by medicine team and therapies  D/C recommendations: - Oxycodone, Tylenol for pain control - Eliquis 2.5 mg BID x 30 days for DVT prophylaxis - No additional need for Vit D supplementation  Follow - up plan: 2 weeks after d/c for wound check and repeat x-rays   Contact information:  Truitt Merle MD, Thyra Breed PA-C. After hours and holidays please check [email or web address] for group call information for [facility]   Thompson Caul, PA-C [phone] (office) [email or web address]

## 2023-05-11 NOTE — TOC Progression Note (Signed)
 Transition of Care [facility]) - Progression Note    Patient Details  Name: Sarah Castro MRN: [id number] Date of Birth: 30-Nov-1940  [facility] CM/SW Contact  Lorri Frederick, LCSW Phone Number: 05/11/2023, 3:30 PM  Clinical Narrative:   CSW spoke with pt and daughter Sarah Castro regarding SNF choices.  We discussed what SNFs in [facility] [place] she is interested in.  We also discussed that pt is recommended for ambulance transport and that this can be expensive outside of 50 mile range.  She is requesting the following SNF in [facility]:  [facility]-1st choice.  [phone]--[facility] attempted to call, no answer, no voicemail option.    [facility] [place] [facility] [place]: [phone].  CSW left message with admissions.  [facility]: [phone].  CSW left message with admissions.   Sarah Castro also agreeable to [place] SNF: requesting [facility] and [facility] as first choices.  CSW sent referral out in hub to higher rated [place] SNFs.  CSW reached out to [facility] and [facility].    Response from [facility]: they are full.        Expected Discharge Plan: Skilled Nursing Facility (vs CIR) Barriers to Discharge: Continued Medical Work up  Expected Discharge Plan and Services   Discharge Planning Services: CM Consult   Living arrangements for the past 2 months: Single Family Home                                       Social Determinants of Health (SDOH) Interventions SDOH Screenings   Food Insecurity: No Food Insecurity (05/08/2023)  Housing: Low Risk  (05/08/2023)  Transportation Needs: No Transportation Needs (05/08/2023)  Utilities: Not At Risk (05/08/2023)  Social Connections: Socially Isolated (05/08/2023)  Tobacco Use: Medium Risk (05/09/2023)    Readmission Risk Interventions     No data to display

## 2023-05-11 NOTE — TOC Initial Note (Signed)
 Transition of Care [facility]) - Initial/Assessment Note    Patient Details  Name: Sarah Castro MRN: [id number] Date of Birth: July 18, 1940  [facility]) CM/SW Contact:    Epifanio Lesches, RN Phone Number: 05/11/2023, 12:21 PM  Clinical Narrative:                    - s/p Cephalomedullary nailing of left intertrochanteric femur fracture, 4/2 From home alone. Supportive daughter , [name]. Daughter resides in [place]. Pt states will need rehab @ d/c and would like to look @ SNFs in [place] and in [place].Marland KitchenDaughter's address: [address], [place] [address]. PTA independent with ADL's.  Per PT's eval/recommendations: SNF./ rehab. Patient reports she  is currently unable to care for self independently at  home given current physical needs and fall risk. Patient expressed understanding of PT recommendation and is agreeable to SNF placement at time of discharge. Patient without preference for  SNF. NCM discussed insurance authorization process and provided Medicare SNF ratings list. Patient expressed being hopeful for rehab and to feel better soon.  No further questions reported at this time. RNCM to continue to follow and assist with discharge planning needs.   Expected Discharge Plan: Skilled Nursing Facility (vs CIR) Barriers to Discharge: Continued Medical Work up   Patient Goals and CMS Choice            Expected Discharge Plan and Services   Discharge Planning Services: CM Consult   Living arrangements for the past 2 months: Single Family Home                                      Prior Living Arrangements/Services Living arrangements for the past 2 months: Single Family Home Lives with:: Self Patient language and need for interpreter reviewed:: Yes Do you feel safe going back to the place where you live?: Yes      Need for Family Participation in Patient Care: Yes (Comment) Care giver support system in place?: Yes (comment)   Criminal Activity/Legal  Involvement Pertinent to Current Situation/Hospitalization: No - Comment as needed  Activities of Daily Living   ADL Screening (condition at time of admission) Independently performs ADLs?: Yes (appropriate for developmental age) Is the patient deaf or have difficulty hearing?: Yes Does the patient have difficulty seeing, even when wearing glasses/contacts?: Yes Does the patient have difficulty concentrating, remembering, or making decisions?: No  Permission Sought/Granted                  Emotional Assessment Appearance:: Appears stated age Attitude/Demeanor/Rapport: Gracious Affect (typically observed): Accepting Orientation: : Oriented to Self, Oriented to Place, Oriented to  Time, Oriented to Situation Alcohol / Substance Use: Not Applicable Psych Involvement: No (comment)  Admission diagnosis:  Closed displaced oblique fracture of shaft of left femur (HCC) [S72.332A] Closed fracture of left femur, unspecified fracture morphology, unspecified portion of femur, initial encounter (HCC) [S72.92XA] Patient Active Problem List   Diagnosis Date Noted   Closed displaced oblique fracture of shaft of left femur (HCC) 05/08/2023   Ground-level fall 05/08/2023   Normocytic anemia 05/08/2023   Hyperkalemia 05/08/2023   S/P total knee arthroplasty, 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   S/P total knee replacement, 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   Tibialis posterior tendon tear, nontraumatic, right 08/07/2022   Peripheral edema 12/27/2020   Gait abnormality 07/22/2020   Cerebral vascular disease 07/22/2020   Left shoulder pain 06/02/2020  Abnormal finding on MRI of brain 06/02/2020   Left rib fracture 05/13/2020   T12 compression fracture (HCC) 10/14/2019   Rotator cuff arthropathy of right shoulder 10/08/2018   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   Displaced comminuted supracondylar fracture without intercondylar fracture of right humerus, initial encounter for closed fracture 01/10/2018   Spinal stenosis  06/12/2016   Neurogenic claudication 06/12/2016   Lumbar radiculopathy 12/08/2015   Degenerative arthritis of left knee 10/27/2015   Piriformis syndrome of right side 01/11/2015   Greater trochanteric bursitis of right hip 11/17/2014   Toenail fungus 08/04/2014   Pes planus of both feet 08/22/2013   Primary localized osteoarthrosis, lower leg 07/25/2013   Expected blood loss anemia 03/19/2012   S/P right TKA 03/18/2012   PCP:  Georgann Housekeeper, MD Pharmacy:   [facility] [phone] - [place], [place] - [address] [facility] [address] [place] [address] Phone: [phone] Fax: [phone]     Social Drivers of Health (SDOH) Social History: SDOH Screenings   Food Insecurity: No Food Insecurity (05/08/2023)  Housing: Low Risk  (05/08/2023)  Transportation Needs: No Transportation Needs (05/08/2023)  Utilities: Not At Risk (05/08/2023)  Social Connections: Socially Isolated (05/08/2023)  Tobacco Use: Medium Risk (05/09/2023)   SDOH Interventions:     Readmission Risk Interventions     No data to display

## 2023-05-11 NOTE — Discharge Instructions (Signed)
 Orthopaedic Trauma Service Discharge Instructions   General Discharge Instructions  WEIGHT BEARING STATUS: Weightbearing as tolerated left lower extremity  RANGE OF MOTION/ACTIVITY: Okay for hip and knee motion as tolerated  Wound Care: Surgical dressings have been removed.  Incisions can be left open to air if there is no drainage. Once the incision is completely dry and without drainage, it may be left open to air out.  Showering may begin postoperative day #3 (Saturday, 05/12/2023).  Clean incision gently with soap and water.  DVT/PE prophylaxis: Eliquis 2.5 mg twice daily x 30 days  Diet: as you were eating previously.  Can use over the counter stool softeners and bowel preparations, such as Miralax, to help with bowel movements.  Narcotics can be constipating.  Be sure to drink plenty of fluids  PAIN MEDICATION USE AND EXPECTATIONS  You have likely been given narcotic medications to help control your pain.  After a traumatic event that results in an fracture (broken bone) with or without surgery, it is ok to use narcotic pain medications to help control one's pain.  We understand that everyone responds to pain differently and each individual patient will be evaluated on a regular basis for the continued need for narcotic medications. Ideally, narcotic medication use should last no more than 6-8 weeks (coinciding with fracture healing).   As a patient it is your responsibility as well to monitor narcotic medication use and report the amount and frequency you use these medications when you come to your office visit.   We would also advise that if you are using narcotic medications, you should take a dose prior to therapy to maximize you participation.  IF YOU ARE ON NARCOTIC MEDICATIONS IT IS NOT PERMISSIBLE TO OPERATE A MOTOR VEHICLE (MOTORCYCLE/CAR/TRUCK/MOPED) OR HEAVY MACHINERY DO NOT MIX NARCOTICS WITH OTHER CNS (CENTRAL NERVOUS SYSTEM) DEPRESSANTS SUCH AS ALCOHOL   STOP SMOKING OR  USING NICOTINE PRODUCTS!!!!  As discussed nicotine severely impairs your body's ability to heal surgical and traumatic wounds but also impairs bone healing.  Wounds and bone heal by forming microscopic blood vessels (angiogenesis) and nicotine is a vasoconstrictor (essentially, shrinks blood vessels).  Therefore, if vasoconstriction occurs to these microscopic blood vessels they essentially disappear and are unable to deliver necessary nutrients to the healing tissue.  This is one modifiable factor that you can do to dramatically increase your chances of healing your injury.    (This means no smoking, no nicotine gum, patches, etc)  DO NOT USE NONSTEROIDAL ANTI-INFLAMMATORY DRUGS (NSAID'S)  Using products such as Advil (ibuprofen), Aleve (naproxen), Motrin (ibuprofen) for additional pain control during fracture healing can delay and/or prevent the healing response.  If you would like to take over the counter (OTC) medication, Tylenol (acetaminophen) is ok.  However, some narcotic medications that are given for pain control contain acetaminophen as well. Therefore, you should not exceed more than 4000 mg of tylenol in a day if you do not have liver disease.  Also note that there are may OTC medicines, such as cold medicines and allergy medicines that my contain tylenol as well.  If you have any questions about medications and/or interactions please ask your doctor/PA or your pharmacist.      ICE AND ELEVATE INJURED/OPERATIVE EXTREMITY  Using ice and elevating the injured extremity above your heart can help with swelling and pain control.  Icing in a pulsatile fashion, such as 20 minutes on and 20 minutes off, can be followed.    Do not place  ice directly on skin. Make sure there is a barrier between to skin and the ice pack.    Using frozen items such as frozen peas works well as the conform nicely to the are that needs to be iced.  USE AN ACE WRAP OR TED HOSE FOR SWELLING CONTROL  In addition to icing  and elevation, Ace wraps or TED hose are used to help limit and resolve swelling.  It is recommended to use Ace wraps or TED hose until you are informed to stop.    When using Ace Wraps start the wrapping distally (farthest away from the body) and wrap proximally (closer to the body)   Example: If you had surgery on your leg or thing and you do not have a splint on, start the ace wrap at the toes and work your way up to the thigh        If you had surgery on your upper extremity and do not have a splint on, start the ace wrap at your fingers and work your way up to the upper arm   CALL THE OFFICE WITH ANY QUESTIONS OR CONCERNS: [phone]   VISIT OUR WEBSITE FOR ADDITIONAL INFORMATION: [email or web address]    Discharge Wound Care Instructions  Do NOT apply any ointments, solutions or lotions to pin sites or surgical wounds.  These prevent needed drainage and even though solutions like hydrogen peroxide kill bacteria, they also damage cells lining the pin sites that help fight infection.  Applying lotions or ointments can keep the wounds moist and can cause them to breakdown and open up as well. This can increase the risk for infection. When in doubt call the office.  Surgical incisions should be dressed daily.  If any drainage is noted, use foam dressing (Mepilex)  - These dressing supplies should be available at local medical supply stores [facility], [facility], etc) as well as Insurance claims handler ([facility], [facility], [employer], etc)  Once the incision is completely dry and without drainage, it may be left open to air out.  Showering may begin 36-48 hours later.  Cleaning gently with soap and water.

## 2023-05-12 DIAGNOSIS — D649 Anemia, unspecified: Secondary | ICD-10-CM | POA: Diagnosis not present

## 2023-05-12 DIAGNOSIS — E875 Hyperkalemia: Secondary | ICD-10-CM | POA: Diagnosis not present

## 2023-05-12 DIAGNOSIS — W1830XA Fall on same level, unspecified, initial encounter: Secondary | ICD-10-CM | POA: Diagnosis not present

## 2023-05-12 DIAGNOSIS — S72332A Displaced oblique fracture of shaft of left femur, initial encounter for closed fracture: Secondary | ICD-10-CM | POA: Diagnosis not present

## 2023-05-12 LAB — VAS US ABI WITH/WO TBI: Left ABI: 1

## 2023-05-12 NOTE — Progress Notes (Signed)
 PROGRESS NOTE    Sarah Castro  XBJ:[id number] DOB: 09/23/1940 DOA: 05/08/2023 PCP: Georgann Housekeeper, MD   Brief Narrative:  Sarah Castro is a 83 y.o. 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  Patient reports being on the ground overnight until neighbor checked on her the following morning.  Given worsening left hip pain and inability to walk patient was brought to our facility for further evaluation.  Imaging confirmed left femoral shaft fracture.  Hospitalist called for admission, orthopedics called in consult.  Patient tolerated procedure well, postoperative anemia appears to stabilize, otherwise stable and agreeable for discharge, tentative plan for discharge to facility pending bed availability and insurance authorization.  Assessment & Plan:   Principal Problem:   Closed displaced oblique fracture of shaft of left femur (HCC) Active Problems:   Ground-level fall   Normocytic anemia   Hyperkalemia  Left femoral shaft fracture fracture Clinical diagnosis osteoporosis Ground-level fall -Orthopedic surgery following, tolerated ORIF 05/09/23 while with cephalomedullary nailing of left intertrochanteric femur fracture -Pain management(IV Dilaudid, oxycodone, acetaminophen) DVT prophylaxis(Lovenox) per orthopedics -Continue vitamin D supplementation -PT/OT following, likely disposition SNF  Iron deficiency, chronic anemia, normocytic Concurrent acute blood loss anemia -Hemoglobin 9, near baseline at intake -History of iron deficiency but unable to tolerate iron well, discussed high iron diet -Iron panel unremarkable, B12 and folate within normal limits -No melena/bruising/bleeding sources identified thus far -Status post 2u PRBC - hgb trending up appropriately   Diminished pedal pulses Rule out peripheral arterial disease - RN to Doppler pulses -  ABI unfortunately a poor study - unable to tolerate L sided evaluation; R side shows non-compressible vessels   First-degree AV block Continue metoprolol, asymptomatic -PR interval 270 at intake   Chronic medical problems: History of prior CVA: Incidentally noted, not on aspirin, continue statin Hypertension: Continue metoprolol, hold home olmesartan, doxazosin, clonidine Hyperlipidemia: Continue statin. Wet macular degeneration, blind OD (sequela of prior endophthalmitis): Outpatient follow-up Mood disorder: Continue home escitalopram  DVT prophylaxis: enoxaparin (LOVENOX) injection 40 mg Start: 05/10/23 0800 SCDs Start: 05/08/23 1951 Code Status:   Code Status: Full Code Family Communication: None present  Status is: Inpatient  Dispo: The patient is from: Home              Anticipated d/c is to: To be determined, likely SNF              Anticipated d/c date is: 48 to 72 hours pending clinical and surgical course              Patient currently not medically stable for discharge given ongoing need for surgical intervention and postop evaluation  Consultants:  Orthopedic surgery  Procedures:  ORIF left femur 05/09/23  Antimicrobials:  Perioperatively  Subjective: No acute issues or events overnight, reports she has slept well compared to prior nights. Foot pain ongoing but improving, otherwise denies nausea vomiting diarrhea constipation headache fevers chills chest pain.  Objective: Vitals:   05/11/23 1257 05/11/23 1559 05/11/23 1952 05/12/23 0743  BP: (!) 155/64 (!) 137/58 (!) 124/45 (!) 156/56  Pulse: 62 79 64 77  Resp: 16 17 16 17   Temp: 98.1 F (36.7 C) 98.6 F (37 C) 99.4 F (37.4 C) 98.3 F (36.8 C)  TempSrc: Oral Oral Oral Oral  SpO2: 93% 94% 95% 90%  Weight:      Height:        Intake/Output Summary (Last  24 hours) at 05/12/2023 0752 Last data filed at 05/11/2023 1700 Gross per 24 hour  Intake 720 ml  Output --  Net 720 ml   Filed Weights   05/08/23  1511 05/09/23 1320  Weight: 59.9 kg 59.9 kg    Examination:  General:  Pleasantly resting in bed, No acute distress. HEENT:  Normocephalic atraumatic.  Sclerae nonicteric, noninjected. Neck:  Without mass or deformity.  Trachea is midline. Lungs:  Clear to auscultate bilaterally without rhonchi, wheeze, or rales. Heart:  Regular rate and rhythm.  Without murmurs, rubs, or gallops. Abdomen:  Soft, nontender, nondistended.  Without guarding or rebound. Extremities: Left lower extremity range of motion decreased secondary to pain   Data Reviewed: I have personally reviewed following labs and imaging studies  CBC: Recent Labs  Lab 05/08/23 1629 05/09/23 0611 05/09/23 1834 05/09/23 1924 05/10/23 0613 05/10/23 1901 05/11/23 0529  WBC 8.1 7.3 7.0  --  7.6  --  7.2  NEUTROABS 6.3  --   --   --   --   --   --   HGB 9.0* 8.7* 5.8* 7.0* 5.9* 8.5* 8.0*  HCT 27.0* 25.2* 17.9* 21.6* 17.8* 24.6* 23.8*  MCV 94.1 92.3 96.8  --  95.2  --  91.5  PLT 166 167 115*  --  147*  --  138*   Basic Metabolic Panel: Recent Labs  Lab 05/08/23 1629 05/09/23 0611 05/09/23 1834 05/10/23 0613 05/11/23 0529  NA 134* 136  --  133* 134*  K 5.0 4.3  --  4.7 4.1  CL 99 99  --  98 100  CO2 23 32  --  25 26  GLUCOSE 167* 113*  --  144* 129*  BUN 25* 30*  --  25* 19  CREATININE 0.86 0.90 0.65 0.69 0.54  CALCIUM 8.7* 8.9  --  7.9* 7.8*  MG  --  2.1  --   --   --   PHOS  --  3.8  --   --   --    GFR: Estimated Creatinine Clearance: 45 mL/min (by C-G formula based on SCr of 0.54 mg/dL).  Coagulation Profile: Recent Labs  Lab 05/09/23 0611  INR 1.0   Cardiac Enzymes: Recent Labs  Lab 05/08/23 1953  CKTOTAL 4,446*   Anemia Panel: No results for input(s): "VITAMINB12", "FOLATE", "FERRITIN", "TIBC", "IRON", "RETICCTPCT" in the last 72 hours.  Recent Results (from the past 240 hours)  MRSA Next Gen by PCR, Nasal     Status: None   Collection Time: 05/09/23 11:34 AM   Specimen: Nasal Mucosa;  Nasal Swab  Result Value Ref Range Status   MRSA by PCR Next Gen NOT DETECTED NOT DETECTED Final    Comment: (NOTE) The GeneXpert MRSA Assay (FDA approved for NASAL specimens only), is one component of a comprehensive MRSA colonization surveillance program. It is not intended to diagnose MRSA infection nor to guide or monitor treatment for MRSA infections. Test performance is not FDA approved in patients less than 20 years old. Performed at [facility], [address] [place], [place] [address]      Radiology Studies: VAS [place] ABI WITH/WO TBI Result Date: 05/10/2023  LOWER EXTREMITY DOPPLER STUDY Patient Name:  SULEIMA OHLENDORF  Date of Exam:   05/10/2023 Medical Rec #: [id number]           Accession #:    [id number] Date of Birth: 09-Nov-1940          Patient Gender:  F Patient Age:   9 years Exam Location:  [facility] Procedure:      VAS [place] ABI WITH/WO TBI Referring Phys: Dolly Rias --------------------------------------------------------------------------------  Indications: Peripheral artery disease. High Risk Factors: Hypertension, hyperlipidemia, prior CVA. Other Factors: Recent fall.  Limitations: Today's exam was limited due to patient intolerant to cuff              pressure. Comparison Study: None Performing Technologist: Shona Simpson  Examination Guidelines: A complete evaluation includes at minimum, Doppler waveform signals and systolic blood pressure reading at the level of bilateral brachial, anterior tibial, and posterior tibial arteries, when vessel segments are accessible. Bilateral testing is considered an integral part of a complete examination. Photoelectric Plethysmograph (PPG) waveforms and toe systolic pressure readings are included as required and additional duplex testing as needed. Limited examinations for reoccurring indications may be performed as noted.  ABI Findings: +--------+------------------+-----+----------+--------+ Right   Rt Pressure  (mmHg)IndexWaveform  Comment  +--------+------------------+-----+----------+--------+ [id number]                    triphasic          +--------+------------------+-----+----------+--------+ PTA     249               1.54 monophasic         +--------+------------------+-----+----------+--------+ DP      162               1.00 monophasic         +--------+------------------+-----+----------+--------+ +--------+------------------+-----+----------+---------------------------+ Left    Lt Pressure (mmHg)IndexWaveform  Comment                     +--------+------------------+-----+----------+---------------------------+ [id number]                    triphasic                             +--------+------------------+-----+----------+---------------------------+ PTA                            monophasicIntolerant to cuff pressure +--------+------------------+-----+----------+---------------------------+ DP                             monophasicIntolerant to cuff pressure +--------+------------------+-----+----------+---------------------------+ +-------+-----------+-----------+------------+------------+ ABI/TBIToday's ABIToday's TBIPrevious ABIPrevious TBI +-------+-----------+-----------+------------+------------+ Right  Kemp                                             +-------+-----------+-----------+------------+------------+ Left   1.00                                           +-------+-----------+-----------+------------+------------+  Summary: Right: Resting right ankle-brachial index indicates noncompressible right lower extremity arteries. Left: Unable to aquire pressures. *See table(s) above for measurements and observations.     Preliminary    Scheduled Meds:  cholecalciferol  1,000 Units Oral Daily   diphenhydrAMINE  50 mg Oral Once   enoxaparin (LOVENOX) injection  40 mg Subcutaneous Q24H   escitalopram  10 mg Oral Daily   gabapentin  100 mg  Oral TID   metoprolol succinate  25 mg Oral QPM   polyethylene  glycol  17 g Oral BID   pravastatin  20 mg Oral q1800   senna-docusate  1 tablet Oral BID   sodium chloride flush  3 mL Intravenous Q12H   Continuous Infusions:     LOS: 4 days   Time spent:  Azucena Fallen, DO [facility]  If 7PM-7AM, please contact night-coverage [email or web address]  05/12/2023, 7:52 AM

## 2023-05-12 NOTE — Progress Notes (Signed)
 Patient very pleasant this shift, pain controlled, daughter at bedside, no acute distress noted.

## 2023-05-12 NOTE — Plan of Care (Signed)

## 2023-05-12 NOTE — Progress Notes (Signed)
 Physical Therapy Treatment Patient Details Name: Sarah Castro MRN: [id number] DOB: 04-13-1940 Today's Date: 05/12/2023   History of Present Illness Pt is a 83 y.o female admitted 05/08/23 from EMS for fall at home. C/o of bilateral hip pain. X-ray showed L proximal subtrochanteric femur fx, no pelvis fx.  IMN performed 4/2. WBAT  PMH: arthritis, hypercholesteremia, HTN, macular degeneration, CVA    PT Comments  Pt received in recliner, eager to participate in therapy. STS x 2 trials with RW mod assist. LE exercises in standing and sitting. Pt remained in recliner at end of session, feet elevated. PT to continue per current POC.    If plan is discharge home, recommend the following: Two people to help with walking and/or transfers;Two people to help with bathing/dressing/bathroom;Assistance with cooking/housework;Direct supervision/assist for medications management;Direct supervision/assist for financial management;Assist for transportation;Help with stairs or ramp for entrance;Supervision due to cognitive status   Can travel by private vehicle     No  Equipment Recommendations  None recommended by PT    Recommendations for Other Services       Precautions / Restrictions Precautions Precautions: Fall Recall of Precautions/Restrictions: Intact Restrictions LLE Weight Bearing Per Provider Order: Weight bearing as tolerated     Mobility  Bed Mobility               General bed mobility comments: Pt up in recliner.    Transfers Overall transfer level: Needs assistance Equipment used: Rolling walker (2 wheels) Transfers: Sit to/from Stand Sit to Stand: Mod assist           General transfer comment: STS x 2 trials. Cues for hand placement and sequencing. Increased time to power up and stabilize balance    Ambulation/Gait                   Stairs             Wheelchair Mobility     Tilt Bed    Modified Rankin (Stroke Patients Only)        Balance Overall balance assessment: Needs assistance Sitting-balance support: Bilateral upper extremity supported, Feet supported Sitting balance-Leahy Scale: Fair     Standing balance support: Bilateral upper extremity supported, During functional activity, Reliant on assistive device for balance Standing balance-Leahy Scale: Poor                              Communication Communication Communication: No apparent difficulties  Cognition Arousal: Alert Behavior During Therapy: WFL for tasks assessed/performed   PT - Cognitive impairments: No apparent impairments                         Following commands: Intact      Cueing Cueing Techniques: Verbal cues, Gestural cues, Tactile cues  Exercises General Exercises - Lower Extremity Long Arc Quad: AROM, Right, Left, 5 reps, Seated Hip ABduction/ADduction: AROM, Left, 5 reps, Standing Hip Flexion/Marching: AROM, Left, 5 reps, Standing Heel Raises: AROM, Both, 5 reps, Standing Other Exercises Other Exercises: lateral weight shifts in stand with RW    General Comments        Pertinent Vitals/Pain Pain Assessment Pain Assessment: Faces Faces Pain Scale: Hurts even more Pain Location: bilat hips Pain Descriptors / Indicators: Aching, Discomfort, Guarding, Grimacing Pain Intervention(s): Monitored during session, Repositioned    Home Living  Prior Function            PT Goals (current goals can now be found in the care plan section) Acute Rehab PT Goals Patient Stated Goal: return to walking Progress towards PT goals: Progressing toward goals    Frequency    Min 2X/week      PT Plan      Co-evaluation              AM-PAC PT "6 Clicks" Mobility   Outcome Measure  Help needed turning from your back to your side while in a flat bed without using bedrails?: A Lot Help needed moving from lying on your back to sitting on the side of a flat bed  without using bedrails?: A Lot Help needed moving to and from a bed to a chair (including a wheelchair)?: A Lot Help needed standing up from a chair using your arms (e.g., wheelchair or bedside chair)?: A Lot Help needed to walk in hospital room?: Total Help needed climbing 3-5 steps with a railing? : Total 6 Click Score: 10    End of Session Equipment Utilized During Treatment: Gait belt Activity Tolerance: Patient tolerated treatment well Patient left: in chair;with call bell/phone within reach;with family/visitor present Nurse Communication: Mobility status PT Visit Diagnosis: Unsteadiness on feet (R26.81);Muscle weakness (generalized) (M62.81);History of falling (Z91.81);Pain Pain - Right/Left: Left Pain - part of body: Hip     Time: 0981-1914 PT Time Calculation (min) (ACUTE ONLY): 24 min  Charges:    $Therapeutic Exercise: 8-22 mins $Therapeutic Activity: 8-22 mins PT General Charges $$ ACUTE PT VISIT: 1 Visit                     Ferd Glassing., PT  Office # [phone]    Ilda Foil 05/12/2023, 12:24 PM

## 2023-05-13 DIAGNOSIS — W1830XA Fall on same level, unspecified, initial encounter: Secondary | ICD-10-CM | POA: Diagnosis not present

## 2023-05-13 DIAGNOSIS — D649 Anemia, unspecified: Secondary | ICD-10-CM | POA: Diagnosis not present

## 2023-05-13 DIAGNOSIS — S72332A Displaced oblique fracture of shaft of left femur, initial encounter for closed fracture: Secondary | ICD-10-CM | POA: Diagnosis not present

## 2023-05-13 DIAGNOSIS — E875 Hyperkalemia: Secondary | ICD-10-CM | POA: Diagnosis not present

## 2023-05-13 LAB — CBC
HCT: 24.9 % — ABNORMAL LOW (ref 36.0–46.0)
Hemoglobin: 8.3 g/dL — ABNORMAL LOW (ref 12.0–15.0)
MCH: 31.6 pg (ref 26.0–34.0)
MCHC: 33.3 g/dL (ref 30.0–36.0)
MCV: 94.7 fL (ref 80.0–100.0)
Platelets: 208 10*3/uL (ref 150–400)
RBC: 2.63 MIL/uL — ABNORMAL LOW (ref 3.87–5.11)
RDW: 13.3 % (ref 11.5–15.5)
WBC: 6.9 10*3/uL (ref 4.0–10.5)
nRBC: 0 % (ref 0.0–0.2)

## 2023-05-13 LAB — BASIC METABOLIC PANEL WITH GFR
Anion gap: 5 (ref 5–15)
BUN: 13 mg/dL (ref 8–23)
CO2: 28 mmol/L (ref 22–32)
Calcium: 7.9 mg/dL — ABNORMAL LOW (ref 8.9–10.3)
Chloride: 100 mmol/L (ref 98–111)
Creatinine, Ser: 0.63 mg/dL (ref 0.44–1.00)
GFR, Estimated: >60 mL/min (ref >60)
Glucose, Bld: 104 mg/dL — ABNORMAL HIGH (ref 70–99)
Potassium: 4.2 mmol/L (ref 3.5–5.1)
Sodium: 133 mmol/L — ABNORMAL LOW (ref 135–145)

## 2023-05-13 NOTE — Progress Notes (Signed)
 PROGRESS NOTE    Sarah Castro  ZOX:[id number] DOB: 1940/12/30 DOA: 05/08/2023 PCP: Georgann Housekeeper, MD   Brief Narrative:  Sarah Castro is a 83 y.o. 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  Patient reports being on the ground overnight until neighbor checked on her the following morning.  Given worsening left hip pain and inability to walk patient was brought to our facility for further evaluation.  Imaging confirmed left femoral shaft fracture.  Hospitalist called for admission, orthopedics called in consult.  Patient tolerated procedure well, postoperative anemia appears to stabilize, otherwise stable and agreeable for discharge, tentative plan for discharge to facility pending bed availability and insurance authorization.  Assessment & Plan:   Principal Problem:   Closed displaced oblique fracture of shaft of left femur (HCC) Active Problems:   Ground-level fall   Normocytic anemia   Hyperkalemia  Left femoral shaft fracture fracture Clinical diagnosis osteoporosis Ground-level fall -Orthopedic surgery following, tolerated ORIF 05/09/23 while with cephalomedullary nailing of left intertrochanteric femur fracture -Pain management(IV Dilaudid, oxycodone, acetaminophen) DVT prophylaxis(Lovenox) per orthopedics -Continue vitamin D supplementation -PT/OT following, likely disposition SNF  Iron deficiency, chronic anemia, normocytic Concurrent acute blood loss anemia -Hemoglobin 9, near baseline at intake -History of iron deficiency but unable to tolerate iron well, discussed high iron diet -Iron panel unremarkable, B12 and folate within normal limits -No melena/bruising/bleeding sources identified thus far -Status post 2u PRBC - hgb stabilizing appropriately - currently 8.3   Diminished pedal pulses Rule out peripheral arterial disease - RN to  Doppler pulses - ABI unfortunately a poor study - unable to tolerate L sided evaluation; R side shows non-compressible vessels   First-degree AV block Continue metoprolol, asymptomatic -PR interval 270 at intake   Chronic medical problems: History of prior CVA: Incidentally noted, not on aspirin, continue statin Hypertension: Continue metoprolol, hold home olmesartan, doxazosin, clonidine Hyperlipidemia: Continue statin. Wet macular degeneration, blind OD (sequela of prior endophthalmitis): Outpatient follow-up Mood disorder: Continue home escitalopram  DVT prophylaxis: enoxaparin (LOVENOX) injection 40 mg Start: 05/10/23 0800 SCDs Start: 05/08/23 1951 Code Status:   Code Status: Full Code Family Communication: None present  Status is: Inpatient  Dispo: The patient is from: Home              Anticipated d/c is to: To be determined, likely SNF              Anticipated d/c date is: 48 to 72 hours pending clinical and surgical course              Patient currently not medically stable for discharge given ongoing need for surgical intervention and postop evaluation  Consultants:  Orthopedic surgery  Procedures:  ORIF left femur 05/09/23  Antimicrobials:  Perioperatively  Subjective: No acute issues or events overnight, reports she has slept well compared to prior nights. Foot pain ongoing but improving, otherwise denies nausea vomiting diarrhea constipation headache fevers chills chest pain.  Objective: Vitals:   05/12/23 1333 05/12/23 1404 05/12/23 2046 05/13/23 0624  BP: (!) 177/54 (!) 163/50 (!) 147/55 (!) 145/63  Pulse: 67 64 68 64  Resp: 17 16 16 16   Temp: 98.6 F (37 C) 98.5 F (36.9 C) 98.6 F (37 C) 98.7 F (37.1 C)  TempSrc: Oral Oral  Oral  SpO2: 98% 95% 97% 94%  Weight:      Height:        Intake/Output  Summary (Last 24 hours) at 05/13/2023 0751 Last data filed at 05/13/2023 0629 Gross per 24 hour  Intake 720 ml  Output 800 ml  Net -80 ml   Filed Weights    05/08/23 1511 05/09/23 1320  Weight: 59.9 kg 59.9 kg    Examination:  General:  Pleasantly resting in bed, No acute distress. HEENT:  Normocephalic atraumatic.  Sclerae nonicteric, noninjected. Neck:  Without mass or deformity.  Trachea is midline. Lungs:  Clear to auscultate bilaterally without rhonchi, wheeze, or rales. Heart:  Regular rate and rhythm.  Without murmurs, rubs, or gallops. Abdomen:  Soft, nontender, nondistended.  Without guarding or rebound. Extremities: Left lower extremity range of motion decreased secondary to pain   Data Reviewed: I have personally reviewed following labs and imaging studies  CBC: Recent Labs  Lab 05/08/23 1629 05/09/23 0611 05/09/23 1834 05/09/23 1924 05/10/23 0613 05/10/23 1901 05/11/23 0529 05/13/23 0603  WBC 8.1 7.3 7.0  --  7.6  --  7.2 6.9  NEUTROABS 6.3  --   --   --   --   --   --   --   HGB 9.0* 8.7* 5.8* 7.0* 5.9* 8.5* 8.0* 8.3*  HCT 27.0* 25.2* 17.9* 21.6* 17.8* 24.6* 23.8* 24.9*  MCV 94.1 92.3 96.8  --  95.2  --  91.5 94.7  PLT 166 167 115*  --  147*  --  138* 208   Basic Metabolic Panel: Recent Labs  Lab 05/08/23 1629 05/09/23 0611 05/09/23 1834 05/10/23 0613 05/11/23 0529 05/13/23 0603  NA 134* 136  --  133* 134* 133*  K 5.0 4.3  --  4.7 4.1 4.2  CL 99 99  --  98 100 100  CO2 23 32  --  25 26 28   GLUCOSE 167* 113*  --  144* 129* 104*  BUN 25* 30*  --  25* 19 13  CREATININE 0.86 0.90 0.65 0.69 0.54 0.63  CALCIUM 8.7* 8.9  --  7.9* 7.8* 7.9*  MG  --  2.1  --   --   --   --   PHOS  --  3.8  --   --   --   --    GFR: Estimated Creatinine Clearance: 45 mL/min (by C-G formula based on SCr of 0.63 mg/dL).  Coagulation Profile: Recent Labs  Lab 05/09/23 0611  INR 1.0   Cardiac Enzymes: Recent Labs  Lab 05/08/23 1953  CKTOTAL 4,446*   Anemia Panel: No results for input(s): "VITAMINB12", "FOLATE", "FERRITIN", "TIBC", "IRON", "RETICCTPCT" in the last 72 hours.  Recent Results (from the past 240  hours)  MRSA Next Gen by PCR, Nasal     Status: None   Collection Time: 05/09/23 11:34 AM   Specimen: Nasal Mucosa; Nasal Swab  Result Value Ref Range Status   MRSA by PCR Next Gen NOT DETECTED NOT DETECTED Final    Comment: (NOTE) The GeneXpert MRSA Assay (FDA approved for NASAL specimens only), is one component of a comprehensive MRSA colonization surveillance program. It is not intended to diagnose MRSA infection nor to guide or monitor treatment for MRSA infections. Test performance is not FDA approved in patients less than 3 years old. Performed at [facility], [address], [place], [place] [address]      Radiology Studies: No results found.  Scheduled Meds:  cholecalciferol  1,000 Units Oral Daily   diphenhydrAMINE  50 mg Oral Once   enoxaparin (LOVENOX) injection  40 mg Subcutaneous Q24H  escitalopram  10 mg Oral Daily   gabapentin  100 mg Oral TID   metoprolol succinate  25 mg Oral QPM   polyethylene glycol  17 g Oral BID   pravastatin  20 mg Oral q1800   senna-docusate  1 tablet Oral BID   sodium chloride flush  3 mL Intravenous Q12H   Continuous Infusions:     LOS: 5 days   Time spent:  Azucena Fallen, DO [facility]  If 7PM-7AM, please contact night-coverage [email or web address]  05/13/2023, 7:51 AM

## 2023-05-13 NOTE — Plan of Care (Signed)

## 2023-05-13 NOTE — Plan of Care (Signed)
  Problem: Education: Goal: Knowledge of General Education information will improve Description: Including pain rating scale, medication(s)/side effects and non-pharmacologic comfort measures 05/13/2023 0330 by Karolee Ohs, RN Outcome: Progressing 05/13/2023 0235 by Karolee Ohs, RN Outcome: Progressing   Problem: Health Behavior/Discharge Planning: Goal: Ability to manage health-related needs will improve 05/13/2023 0330 by Karolee Ohs, RN Outcome: Progressing 05/13/2023 0235 by Karolee Ohs, RN Outcome: Progressing   Problem: Clinical Measurements: Goal: Ability to maintain clinical measurements within normal limits will improve Outcome: Progressing Goal: Will remain free from infection Outcome: Progressing Goal: Diagnostic test results will improve Outcome: Progressing

## 2023-05-14 DIAGNOSIS — D649 Anemia, unspecified: Secondary | ICD-10-CM | POA: Diagnosis not present

## 2023-05-14 DIAGNOSIS — W1830XA Fall on same level, unspecified, initial encounter: Secondary | ICD-10-CM | POA: Diagnosis not present

## 2023-05-14 DIAGNOSIS — S72332A Displaced oblique fracture of shaft of left femur, initial encounter for closed fracture: Secondary | ICD-10-CM | POA: Diagnosis not present

## 2023-05-14 MED ORDER — IRBESARTAN 150 MG PO TABS
75.0000 mg | ORAL_TABLET | Freq: Every day | ORAL | Status: DC
  Administered 2023-05-14 – 2023-05-17 (×4): 75 mg via ORAL
  Filled 2023-05-14 (×4): qty 1

## 2023-05-14 MED ORDER — DOXAZOSIN MESYLATE 4 MG PO TABS
4.0000 mg | ORAL_TABLET | Freq: Two times a day (BID) | ORAL | Status: DC
  Administered 2023-05-14 – 2023-05-17 (×7): 4 mg via ORAL
  Filled 2023-05-14 (×8): qty 1

## 2023-05-14 NOTE — Progress Notes (Signed)
 Occupational Therapy Treatment Patient Details Name: Sarah Castro MRN: [id number] DOB: [birth year] Today's Date: 05/14/2023   History of present illness Pt is a 83 y.o female admitted 05/08/23 from EMS for fall at home. C/o of bilateral hip pain. X-ray showed L proximal subtrochanteric femur fx, no pelvis fx.  IMN performed 4/2. WBAT  PMH: arthritis, hypercholesteremia, HTN, macular degeneration, CVA   OT comments  Pt progressing well towards goals. Improved ROM to almost cross legs to don socks. Pt able to progress STS to min assist, and take 3 small steps with mod assist to steady, and max encouragement. Continue to recommend <3 hours of skilled rehab daily to optimize independence levels. Will continue to follow acutely.       If plan is discharge home, recommend the following:  Two people to help with walking and/or transfers;A lot of help with bathing/dressing/bathroom;Assistance with cooking/housework;Assist for transportation;Help with stairs or ramp for entrance   Equipment Recommendations  Other (comment) (Defer to next venue)    Recommendations for Other Services      Precautions / Restrictions Precautions Precautions: Fall Recall of Precautions/Restrictions: Intact Restrictions Weight Bearing Restrictions Per Provider Order: Yes LLE Weight Bearing Per Provider Order: Weight bearing as tolerated       Mobility Bed Mobility Overal bed mobility: Needs Assistance Bed Mobility: Supine to Sit     Supine to sit: Min assist, HOB elevated, Used rails     General bed mobility comments: LLE    Transfers Overall transfer level: Needs assistance Equipment used: Rolling walker (2 wheels) Transfers: Sit to/from Stand, Bed to chair/wheelchair/BSC Sit to Stand: Min assist, From elevated surface     Step pivot transfers: Mod assist     General transfer comment: Pt able to progress to take 3 steps to recliner with mod assist to steady and max encouragement      Balance Overall balance assessment: Needs assistance Sitting-balance support: Bilateral upper extremity supported, Feet supported Sitting balance-Leahy Scale: Fair     Standing balance support: Bilateral upper extremity supported, During functional activity, Reliant on assistive device for balance Standing balance-Leahy Scale: Poor Standing balance comment: reliant on RW                           ADL either performed or assessed with clinical judgement   ADL Overall ADL's : Needs assistance/impaired           Lower Body Dressing: Total assistance;Sitting/lateral leans Lower Body Dressing Details (indicate cue type and reason): Pt unable to don socks, improved ROM to almost cross legs Toilet Transfer: Moderate assistance;Cueing for sequencing;Ambulation;Rolling walker (2 wheels) Toilet Transfer Details (indicate cue type and reason): Simulated in room Toileting- Clothing Manipulation and Hygiene: Total assistance;Sit to/from stand Toileting - Clothing Manipulation Details (indicate cue type and reason): Pt unable to maintain balance while wiping     Functional mobility during ADLs: Moderate assistance;Rolling walker (2 wheels);Cueing for sequencing General ADL Comments: Pain continues to limit pt    Extremity/Trunk Assessment Upper Extremity Assessment Upper Extremity Assessment: RUE deficits/detail;Left hand dominant;Generalized weakness RUE Deficits / Details: PMH: broken elbow decreased shoulder ROM, trigger finger RUE Sensation: WNL RUE Coordination: decreased gross motor;decreased fine motor   Lower Extremity Assessment Lower Extremity Assessment: Defer to PT evaluation        Vision   Vision Assessment?: No apparent visual deficits         Communication Communication Communication: No apparent difficulties   Cognition Arousal:  Alert Behavior During Therapy: WFL for tasks assessed/performed Cognition: No apparent impairments            Following commands: Intact        Cueing   Cueing Techniques: Verbal cues, Gestural cues, Tactile cues        General Comments Daughter present for session    Pertinent Vitals/ Pain       Pain Assessment Pain Assessment: Faces Faces Pain Scale: Hurts even more Pain Location: bilat hips Pain Descriptors / Indicators: Aching, Discomfort, Guarding, Grimacing Pain Intervention(s): Monitored during session, Premedicated before session, Repositioned   Frequency  Min 2X/week        Progress Toward Goals  OT Goals(current goals can now be found in the care plan section)  Progress towards OT goals: Progressing toward goals  Acute Rehab OT Goals Patient Stated Goal: To go to rehab OT Goal Formulation: With patient Time For Goal Achievement: 05/24/23 Potential to Achieve Goals: Good ADL Goals Pt Will Perform Lower Body Dressing: with mod assist;sitting/lateral leans;sit to/from stand Pt Will Transfer to Toilet: with mod assist;stand pivot transfer;bedside commode Pt Will Perform Toileting - Clothing Manipulation and hygiene: with mod assist;sit to/from stand  Plan         AM-PAC OT "6 Clicks" Daily Activity     Outcome Measure   Help from another person eating meals?: None Help from another person taking care of personal grooming?: A Little Help from another person toileting, which includes using toliet, bedpan, or urinal?: Total Help from another person bathing (including washing, rinsing, drying)?: A Lot Help from another person to put on and taking off regular upper body clothing?: A Little Help from another person to put on and taking off regular lower body clothing?: Total 6 Click Score: 14    End of Session Equipment Utilized During Treatment: Gait belt;Rolling walker (2 wheels)  OT Visit Diagnosis: Unsteadiness on feet (R26.81);Other abnormalities of gait and mobility (R26.89);History of falling (Z91.81);Muscle weakness (generalized) (M62.81)   Activity  Tolerance Patient limited by pain   Patient Left in chair;with call bell/phone within reach;with chair alarm set;with family/visitor present   Nurse Communication Mobility status        Time: 1110-1143 OT Time Calculation (min): 33 min  Charges: OT General Charges $OT Visit: 1 Visit OT Treatments $Self Care/Home Management : 23-37 mins  Ivor Messier, OT  Acute Rehabilitation Services Office [phone] Secure chat preferred   Marilynne Drivers 05/14/2023, 12:59 PM

## 2023-05-14 NOTE — TOC Progression Note (Addendum)
 Transition of Care [facility]) - Progression Note    Patient Details  Name: Sarah Castro MRN: [id number] Date of Birth: 04/24/1940  [facility]) CM/SW Contact  Lorri Frederick, LCSW Phone Number: 05/14/2023, 10:17 AM  Clinical Narrative:   PASSR received: [phone] [id number] bed offers provided to pt and daughter [name].  They are requesting response from [facility] and [facility].  CSW reached out to those facilities.  CSW called and left message at [facility]  CSW spoke with Kay[facility]: they cannot accept [facility] Medicare.    CSW left message with Jennifer/admissions at [facility].   1130: daughter updated: [facility] and [address] have both offered beds.  Per [name], her husband is reaching out to [employer].  1545: CSW spoke with [place].  She is still waiting to hear from her husband.  She will make decision on plan by tomorrow AM.   Expected Discharge Plan: Skilled Nursing Facility (vs CIR) Barriers to Discharge: Continued Medical Work up  Expected Discharge Plan and Services   Discharge Planning Services: CM Consult   Living arrangements for the past 2 months: Single Family Home                                       Social Determinants of Health (SDOH) Interventions SDOH Screenings   Food Insecurity: No Food Insecurity (05/08/2023)  Housing: Low Risk  (05/08/2023)  Transportation Needs: No Transportation Needs (05/08/2023)  Utilities: Not At Risk (05/08/2023)  Social Connections: Socially Isolated (05/08/2023)  Tobacco Use: Medium Risk (05/09/2023)    Readmission Risk Interventions     No data to display

## 2023-05-14 NOTE — Progress Notes (Signed)
 PROGRESS NOTE    SABA GOMM  ZOX:[id number] DOB: 1940/03/30 DOA: 05/08/2023 PCP: Georgann Housekeeper, MD   Brief Narrative:  Sarah Castro is a 83 y.o. 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  Patient reports being on the ground overnight until neighbor checked on her the following morning.  Given worsening left hip pain and inability to walk patient was brought to our facility for further evaluation.  Imaging confirmed left femoral shaft fracture.  Hospitalist called for admission, orthopedics called in consult.  Patient tolerated procedure well, postoperative anemia appears to stabilize, otherwise stable and agreeable for discharge, tentative plan for discharge to facility pending bed availability and insurance authorization.  Assessment & Plan:   Principal Problem:   Closed displaced oblique fracture of shaft of left femur (HCC) Active Problems:   Ground-level fall   Normocytic anemia   Hyperkalemia  Left femoral shaft fracture fracture Clinical diagnosis osteoporosis Ground-level fall -Orthopedic surgery following, tolerated ORIF 05/09/23 while with cephalomedullary nailing of left intertrochanteric femur fracture -Pain management(IV Dilaudid, oxycodone, acetaminophen) DVT prophylaxis(Lovenox) per orthopedics -Continue vitamin D supplementation -PT/OT following, likely disposition SNF  Iron deficiency, chronic anemia, normocytic Concurrent acute blood loss anemia/postop anemia -Hemoglobin 9, near baseline at intake -History of iron deficiency but unable to tolerate iron well, discussed high iron diet -Iron panel unremarkable, B12 and folate within normal limits -L flank - appears to be stable -Status post 2u PRBC - hgb stabilizing appropriately - currently 8.3 - no longer following   Diminished pedal pulses Rule out peripheral arterial disease -  RN to Doppler pulses - ABI unfortunately a poor study - unable to tolerate L sided evaluation; R side shows non-compressible vessels   First-degree AV block Continue metoprolol, asymptomatic -PR interval 270 at intake   Chronic medical problems: History of prior CVA: Incidentally noted, not on aspirin, continue statin Hypertension: Poorly controlled, continue metoprolol add irbesartan, doxazosin.  Discontinue as needed clonidine given elevated risk for rebound hypertension Hyperlipidemia: Continue statin. Wet macular degeneration, blind OD (sequela of prior endophthalmitis): Outpatient follow-up Mood disorder: Continue home escitalopram  DVT prophylaxis: enoxaparin (LOVENOX) injection 40 mg Start: 05/10/23 0800 SCDs Start: 05/08/23 1951 Code Status:   Code Status: Full Code Family Communication: None present  Status is: Inpatient  Dispo: The patient is from: Home              Anticipated d/c is to: To be determined, likely SNF              Anticipated d/c date is: 24 hours pending clinical and surgical course              Patient currently is medically stable for discharge as she continues to recover  Consultants:  Orthopedic surgery  Procedures:  ORIF left femur 05/09/23  Antimicrobials:  Perioperatively  Subjective: No acute issues or events overnight, reporting some stiffness in her left hip otherwise foot pain improving, otherwise denies nausea vomiting diarrhea constipation headache fevers chills chest pain.  Objective: Vitals:   05/13/23 1646 05/13/23 2103 05/14/23 0645 05/14/23 0812  BP: (!) 157/50 (!) 140/52 (!) 171/62 (!) 182/62  Pulse:  (!) 56 (!) 55 60  Resp: 18 16 16 18   Temp: 99.4 F (37.4 C) 98.5 F (36.9 C) 97.7 F (36.5 C) 99 F (37.2 C)  TempSrc:    Oral  SpO2:  95% 95% 99%  Weight:  Height:        Intake/Output Summary (Last 24 hours) at 05/14/2023 1246 Last data filed at 05/14/2023 0900 Gross per 24 hour  Intake 240 ml  Output 450 ml  Net  -210 ml   Filed Weights   05/08/23 1511 05/09/23 1320  Weight: 59.9 kg 59.9 kg    Examination:  General:  Pleasantly resting in bed, No acute distress. HEENT:  Normocephalic atraumatic.  Sclerae nonicteric, noninjected. Neck:  Without mass or deformity.  Trachea is midline. Lungs:  Clear to auscultate bilaterally without rhonchi, wheeze, or rales. Heart:  Regular rate and rhythm.  Without murmurs, rubs, or gallops. Abdomen:  Soft, nontender, nondistended.  Without guarding or rebound. Extremities: Left lower extremity range of motion decreased secondary to pain   Data Reviewed: I have personally reviewed following labs and imaging studies  CBC: Recent Labs  Lab 05/08/23 1629 05/09/23 0611 05/09/23 1834 05/09/23 1924 05/10/23 0613 05/10/23 1901 05/11/23 0529 05/13/23 0603  WBC 8.1 7.3 7.0  --  7.6  --  7.2 6.9  NEUTROABS 6.3  --   --   --   --   --   --   --   HGB 9.0* 8.7* 5.8* 7.0* 5.9* 8.5* 8.0* 8.3*  HCT 27.0* 25.2* 17.9* 21.6* 17.8* 24.6* 23.8* 24.9*  MCV 94.1 92.3 96.8  --  95.2  --  91.5 94.7  PLT 166 167 115*  --  147*  --  138* 208   Basic Metabolic Panel: Recent Labs  Lab 05/08/23 1629 05/09/23 0611 05/09/23 1834 05/10/23 0613 05/11/23 0529 05/13/23 0603  NA 134* 136  --  133* 134* 133*  K 5.0 4.3  --  4.7 4.1 4.2  CL 99 99  --  98 100 100  CO2 23 32  --  25 26 28   GLUCOSE 167* 113*  --  144* 129* 104*  BUN 25* 30*  --  25* 19 13  CREATININE 0.86 0.90 0.65 0.69 0.54 0.63  CALCIUM 8.7* 8.9  --  7.9* 7.8* 7.9*  MG  --  2.1  --   --   --   --   PHOS  --  3.8  --   --   --   --    GFR: Estimated Creatinine Clearance: 45 mL/min (by C-G formula based on SCr of 0.63 mg/dL).  Coagulation Profile: Recent Labs  Lab 05/09/23 0611  INR 1.0   Cardiac Enzymes: Recent Labs  Lab 05/08/23 1953  CKTOTAL 4,446*   Anemia Panel: No results for input(s): "VITAMINB12", "FOLATE", "FERRITIN", "TIBC", "IRON", "RETICCTPCT" in the last 72 hours.  Recent  Results (from the past 240 hours)  MRSA Next Gen by PCR, Nasal     Status: None   Collection Time: 05/09/23 11:34 AM   Specimen: Nasal Mucosa; Nasal Swab  Result Value Ref Range Status   MRSA by PCR Next Gen NOT DETECTED NOT DETECTED Final    Comment: (NOTE) The GeneXpert MRSA Assay (FDA approved for NASAL specimens only), is one component of a comprehensive MRSA colonization surveillance program. It is not intended to diagnose MRSA infection nor to guide or monitor treatment for MRSA infections. Test performance is not FDA approved in patients less than 62 years old. Performed at [facility], [address], [place], [place] [address]      Radiology Studies: No results found.  Scheduled Meds:  cholecalciferol  1,000 Units Oral Daily   diphenhydrAMINE  50 mg Oral Once  doxazosin  4 mg Oral BID   enoxaparin (LOVENOX) injection  40 mg Subcutaneous Q24H   escitalopram  10 mg Oral Daily   gabapentin  100 mg Oral TID   irbesartan  75 mg Oral Daily   metoprolol succinate  25 mg Oral QPM   polyethylene glycol  17 g Oral BID   pravastatin  20 mg Oral q1800   senna-docusate  1 tablet Oral BID   sodium chloride flush  3 mL Intravenous Q12H   Continuous Infusions:     LOS: 6 days   Time spent:  Azucena Fallen, DO [facility]  If 7PM-7AM, please contact night-coverage [email or web address]  05/14/2023, 12:46 PM

## 2023-05-14 NOTE — Plan of Care (Signed)

## 2023-05-15 ENCOUNTER — Inpatient Hospital Stay (HOSPITAL_COMMUNITY)

## 2023-05-15 DIAGNOSIS — W1830XA Fall on same level, unspecified, initial encounter: Secondary | ICD-10-CM | POA: Diagnosis not present

## 2023-05-15 DIAGNOSIS — S72332A Displaced oblique fracture of shaft of left femur, initial encounter for closed fracture: Secondary | ICD-10-CM | POA: Diagnosis not present

## 2023-05-15 DIAGNOSIS — M7989 Other specified soft tissue disorders: Secondary | ICD-10-CM

## 2023-05-15 DIAGNOSIS — D649 Anemia, unspecified: Secondary | ICD-10-CM | POA: Diagnosis not present

## 2023-05-15 MED ORDER — DIPHENHYDRAMINE-ZINC ACETATE 2-0.1 % EX CREA
TOPICAL_CREAM | Freq: Three times a day (TID) | CUTANEOUS | Status: DC | PRN
  Filled 2023-05-15 (×2): qty 28

## 2023-05-15 MED ORDER — METHOCARBAMOL 1000 MG/10ML IJ SOLN
500.0000 mg | Freq: Once | INTRAMUSCULAR | Status: AC
  Administered 2023-05-15: 500 mg via INTRAVENOUS
  Filled 2023-05-15: qty 10

## 2023-05-15 NOTE — Progress Notes (Signed)
 Orthopaedic Trauma Progress Note  SUBJECTIVE: Doing okay today, pain manageable.  Has increase in swelling and bruising through the left leg over the past 3 days. Increased calf pain. Will order U/S to rule out DVT this AM. Making slow progress with therapies. No lightheadedness or dizziness. No chest pain. No SOB. No nausea/vomiting. No other complaints. Tolerating diet and fluids. No family at bedside  OBJECTIVE:  Vitals:   05/15/23 0621 05/15/23 0730  BP: (!) 143/64 (!) 179/54  Pulse: 75 67  Resp: 14 16  Temp: 98 F (36.7 C) 98.7 F (37.1 C)  SpO2: 94% 95%    General: Sitting up in bed, NAD Respiratory: No increased work of breathing.  LLE: Dressings removed, incisions are clean, dry, intact with Dermabond in place.  Worsening bruising and swelling over the hip and throughout the lateral thigh. Increased calf pain. Ankle DF/PF intact. +DP pulse  IMAGING: Stable post op imaging.   LABS:  No results found for this or any previous visit (from the past 24 hours).   ASSESSMENT: Sarah Castro is a 83 y.o. female, 6 Days Post-Op s/p INTRAMEDULLARY NAIL LEFT INTERTROCHANTERIC FEMUR FRACTURE  CV/Blood loss: Acute blood loss anemia, Hgb 8.3 on 05/13/23. Hemodynamically stable  PLAN: Weightbearing: WBAT LLE ROM:  Ok for unrestricted ROM  Incisional and dressing care: Ok to leave incisions open to air. Change dressing PRN Showering:  Ok to begin getting incisions wet 05/12/23 Orthopedic device(s): None  Pain management:  1. Tylenol 1000 mg q 6 hours PRN 2. Oxycodone 2.5-5 mg q 4 hours PRN 3. Dilaudid 0.5 mg q 4 hours PRN 4. Neurontin 100 mg TID VTE prophylaxis: Lovenox, SCDs ID:  Ancef 2gm post op completed Foley/Lines:  No foley, KVO IVFs Impediments to Fracture Healing: Vit D level 86, continue home dose. No additional supplementation needed Dispo: Venous u/s ordered for this AM. PT/OT eval ongoing, recommending SNF. Okay for discharge from ortho standpoint once cleared by  medicine team and therapies  D/C recommendations: - Oxycodone, Tylenol for pain control - Eliquis 2.5 mg BID x 30 days for DVT prophylaxis - No additional need for Vit D supplementation  Follow - up plan: 2 weeks after d/c for wound check and repeat x-rays   Contact information:  Truitt Merle MD, Thyra Breed PA-C. After hours and holidays please check [email or web address] for group call information for [facility]   Thompson Caul, PA-C [phone] (office) [email or web address]

## 2023-05-15 NOTE — TOC Progression Note (Addendum)
 Transition of Care [facility]) - Progression Note    Patient Details  Name: Sarah Castro MRN: [id number] Date of Birth: Sep 12, 1940  [facility] CM/SW Contact  Lorri Frederick, LCSW Phone Number: 05/15/2023, 8:54 AM  Clinical Narrative:   CSW able to speak to Sinclair Ship at [facility], who will review a referral.  Referral emailed to: [email or web address].  1200: [name] via email, does offer bed. First available bed will be Thursday.  CSW spoke with [facility], obtained estimate for ambulance transport to [place] of $2500.  Daughter informed.  Her husband is going to visit [name] before making a decision.  1500: CSW spoke with pt in room.  Daughter is visiting [place].    CSW continues to await family making decision on SNF choice.     Expected Discharge Plan: Skilled Nursing Facility (vs CIR) Barriers to Discharge: Continued Medical Work up  Expected Discharge Plan and Services   Discharge Planning Services: CM Consult   Living arrangements for the past 2 months: Single Family Home                                       Social Determinants of Health (SDOH) Interventions SDOH Screenings   Food Insecurity: No Food Insecurity (05/08/2023)  Housing: Low Risk  (05/08/2023)  Transportation Needs: No Transportation Needs (05/08/2023)  Utilities: Not At Risk (05/08/2023)  Social Connections: Socially Isolated (05/08/2023)  Tobacco Use: Medium Risk (05/09/2023)    Readmission Risk Interventions     No data to display

## 2023-05-15 NOTE — Plan of Care (Signed)

## 2023-05-15 NOTE — Progress Notes (Signed)
 PROGRESS NOTE    BRENDY FICEK  GNF:[id number] DOB: 1940/03/04 DOA: 05/08/2023 PCP: Georgann Housekeeper, MD   Brief Narrative:  Sarah Castro is a 83 y.o. 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  Patient reports being on the ground overnight until neighbor checked on her the following morning.  Given worsening left hip pain and inability to walk patient was brought to our facility for further evaluation.  Imaging confirmed left femoral shaft fracture.  Hospitalist called for admission, orthopedics called in consult.  Patient tolerated procedure well, postoperative anemia appears to stabilize, otherwise stable and agreeable for discharge, tentative plan for discharge to facility pending bed availability and insurance authorization.  Assessment & Plan:   Principal Problem:   Closed displaced oblique fracture of shaft of left femur (HCC) Active Problems:   Ground-level fall   Normocytic anemia   Hyperkalemia  Left femoral shaft fracture fracture Clinical diagnosis osteoporosis Ground-level fall -Orthopedic surgery following, tolerated ORIF 05/09/23 while with cephalomedullary nailing of left intertrochanteric femur fracture -Pain management(IV Dilaudid, oxycodone, acetaminophen) DVT prophylaxis(Lovenox) per orthopedics -Continue vitamin D supplementation -PT/OT following, likely disposition SNF  Iron deficiency, chronic anemia, normocytic Concurrent acute blood loss anemia/postop anemia -Hemoglobin 9, near baseline at intake -History of iron deficiency but unable to tolerate iron well, discussed high iron diet -Iron panel unremarkable, B12 and folate within normal limits -L flank - appears to be stable -Status post 2u PRBC - hgb stabilizing appropriately - currently 8.3 - no longer following   Left proximal thigh pitting edema, rule out DVT  Rule out  peripheral arterial disease - ABI unfortunately a poor study - unable to tolerate L sided evaluation; R side shows non-compressible vessels -DVT study pending   First-degree AV block Continue metoprolol, asymptomatic -PR interval 270 at intake   Chronic medical problems: History of prior CVA: Incidentally noted, not on aspirin, continue statin Hypertension: Poorly controlled, continue metoprolol add irbesartan, doxazosin.  Discontinue as needed clonidine given elevated risk for rebound hypertension Hyperlipidemia: Continue statin. Wet macular degeneration, blind OD (sequela of prior endophthalmitis): Outpatient follow-up Mood disorder: Continue home escitalopram  DVT prophylaxis: enoxaparin (LOVENOX) injection 40 mg Start: 05/10/23 0800 SCDs Start: 05/08/23 1951 Code Status:   Code Status: Full Code Family Communication: None present  Status is: Inpatient  Dispo: The patient is from: Home              Anticipated d/c is to: To be determined, likely SNF              Anticipated d/c date is: 24 hours pending clinical and surgical course              Patient currently is medically stable for discharge as she continues to recover  Consultants:  Orthopedic surgery  Procedures:  ORIF left femur 05/09/23  Antimicrobials:  Perioperatively  Subjective: No acute issues or events overnight, reports worsening pain in her anterior proximal left thigh.  Notes edema and range of motion are improving, worried about yellowing of skin in her thigh as well.  Otherwise denies nausea vomiting diarrhea constipation headache fevers chills or chest pain  Objective: Vitals:   05/14/23 1717 05/14/23 2208 05/15/23 0621 05/15/23 0730  BP:  (!) 126/51 (!) 143/64 (!) 179/54  Pulse: 65 71 75 67  Resp:   14 16  Temp:  97.8 F (36.6 C) 98 F (36.7 C) 98.7 F (37.1 C)  TempSrc:  Oral Oral Oral  SpO2:  96% 94% 95%  Weight:      Height:        Intake/Output Summary (Last 24 hours) at 05/15/2023  0738 Last data filed at 05/15/2023 0600 Gross per 24 hour  Intake 720 ml  Output 900 ml  Net -180 ml   Filed Weights   05/08/23 1511 05/09/23 1320  Weight: 59.9 kg 59.9 kg    Examination:  General:  Pleasantly resting in bed, No acute distress. HEENT:  Normocephalic atraumatic.  Sclerae nonicteric, noninjected. Neck:  Without mass or deformity.  Trachea is midline. Lungs:  Clear to auscultate bilaterally without rhonchi, wheeze, or rales. Heart:  Regular rate and rhythm.  Without murmurs, rubs, or gallops. Abdomen:  Soft, nontender, nondistended.  Without guarding or rebound. Extremities: Left lower extremity range of motion decreased secondary to pain, notable jaundice tone of left leg in the setting of large ecchymosis of the left flank, left thigh 2+ pitting edema approximately to the hip  Data Reviewed: I have personally reviewed following labs and imaging studies  CBC: Recent Labs  Lab 05/08/23 1629 05/09/23 0611 05/09/23 1834 05/09/23 1924 05/10/23 0613 05/10/23 1901 05/11/23 0529 05/13/23 0603  WBC 8.1 7.3 7.0  --  7.6  --  7.2 6.9  NEUTROABS 6.3  --   --   --   --   --   --   --   HGB 9.0* 8.7* 5.8* 7.0* 5.9* 8.5* 8.0* 8.3*  HCT 27.0* 25.2* 17.9* 21.6* 17.8* 24.6* 23.8* 24.9*  MCV 94.1 92.3 96.8  --  95.2  --  91.5 94.7  PLT 166 167 115*  --  147*  --  138* 208   Basic Metabolic Panel: Recent Labs  Lab 05/08/23 1629 05/09/23 0611 05/09/23 1834 05/10/23 0613 05/11/23 0529 05/13/23 0603  NA 134* 136  --  133* 134* 133*  K 5.0 4.3  --  4.7 4.1 4.2  CL 99 99  --  98 100 100  CO2 23 32  --  25 26 28   GLUCOSE 167* 113*  --  144* 129* 104*  BUN 25* 30*  --  25* 19 13  CREATININE 0.86 0.90 0.65 0.69 0.54 0.63  CALCIUM 8.7* 8.9  --  7.9* 7.8* 7.9*  MG  --  2.1  --   --   --   --   PHOS  --  3.8  --   --   --   --    GFR: Estimated Creatinine Clearance: 45 mL/min (by C-G formula based on SCr of 0.63 mg/dL).  Coagulation Profile: Recent Labs  Lab  05/09/23 0611  INR 1.0   Cardiac Enzymes: Recent Labs  Lab 05/08/23 1953  CKTOTAL 4,446*   Anemia Panel: No results for input(s): "VITAMINB12", "FOLATE", "FERRITIN", "TIBC", "IRON", "RETICCTPCT" in the last 72 hours.  Recent Results (from the past 240 hours)  MRSA Next Gen by PCR, Nasal     Status: None   Collection Time: 05/09/23 11:34 AM   Specimen: Nasal Mucosa; Nasal Swab  Result Value Ref Range Status   MRSA by PCR Next Gen NOT DETECTED NOT DETECTED Final    Comment: (NOTE) The GeneXpert MRSA Assay (FDA approved for NASAL specimens only), is one component of a comprehensive MRSA colonization surveillance program. It is not intended to diagnose MRSA infection nor to guide or monitor treatment for MRSA infections. Test performance is not FDA approved in patients less than 57 years old. Performed  at [facility], [address] [place], [place] [address]      Radiology Studies: No results found.  Scheduled Meds:  cholecalciferol  1,000 Units Oral Daily   doxazosin  4 mg Oral BID   enoxaparin (LOVENOX) injection  40 mg Subcutaneous Q24H   escitalopram  10 mg Oral Daily   gabapentin  100 mg Oral TID   irbesartan  75 mg Oral Daily   metoprolol succinate  25 mg Oral QPM   polyethylene glycol  17 g Oral BID   pravastatin  20 mg Oral q1800   senna-docusate  1 tablet Oral BID   sodium chloride flush  3 mL Intravenous Q12H   Continuous Infusions:     LOS: 7 days   Time spent:  Azucena Fallen, DO [facility]  If 7PM-7AM, please contact night-coverage [email or web address]  05/15/2023, 7:38 AM

## 2023-05-15 NOTE — Progress Notes (Signed)
 Lower extremity venous duplex completed. Please see CV Procedures for preliminary results.  Shona Simpson, RVT 05/15/23 3:30 PM

## 2023-05-15 NOTE — Plan of Care (Signed)
   Problem: Coping: Goal: Level of anxiety will decrease Outcome: Progressing

## 2023-05-15 NOTE — Progress Notes (Signed)
 Physical Therapy Treatment Patient Details Name: Sarah Castro MRN: [id number] DOB: April 14, 1940 Today's Date: 05/15/2023   History of Present Illness Pt is a 83 y.o female admitted 05/08/23 from EMS for fall at home. C/o of bilateral hip pain. X-ray showed L proximal subtrochanteric femur fx, no pelvis fx.  IMN performed 4/2. WBAT  PMH: arthritis, hypercholesteremia, HTN, macular degeneration, CVA    PT Comments  PT focus of session on functional transfers and LE exercise s/p L IMN.  Pt tolerated repeated transfers into standing at EOB well, does require significant +2 to stand given pain and weakness. Pt also tolerated LE exercise well, is limited by LLE pain but benefits from PT active assist to perform. Pt progressing in tolerance for activity, plan remains appropriate.     If plan is discharge home, recommend the following: Two people to help with walking and/or transfers   Can travel by private vehicle     No  Equipment Recommendations       Recommendations for Other Services       Precautions / Restrictions Precautions Precautions: Fall Restrictions Weight Bearing Restrictions Per Provider Order: Yes LLE Weight Bearing Per Provider Order: Weight bearing as tolerated     Mobility  Bed Mobility Overal bed mobility: Needs Assistance Bed Mobility: Rolling, Sidelying to Sit, Sit to Supine Rolling: +2 for physical assistance, Mod assist, Used rails Sidelying to sit: +2 for physical assistance, Mod assist, HOB elevated   Sit to supine: +2 for physical assistance, Mod assist, HOB elevated   General bed mobility comments: Pt used bed rails to help to roll into her right side. Pt was mod assist +2 for both rolling and for sitting on EOB from sidelying. PT helped pt with trunk as pt was trying to get seated at EOB. Second PT helped to carry pt's legs out of the bed as patient was sitting up on EOB. PT cuing for sequencing    Transfers Overall transfer level: Needs  assistance Equipment used: Rolling walker (2 wheels) Transfers: Sit to/from Stand Sit to Stand: Max assist, +2 physical assistance           General transfer comment: P performed 3 sit to stands from EOB with max assist +2, facilitation provided at sacrum and trunk to facilitate rise. Pt was able to initiate movement. Pt needed min cuing for hand placement during sit to stand. Pt was able to stand up by bedside with RW with CGA without LOB for 30 seconds. Pt was not able to perforn more sit to stands due to increased fatigue.    Ambulation/Gait                   Stairs             Wheelchair Mobility     Tilt Bed    Modified Rankin (Stroke Patients Only)       Balance Overall balance assessment: Needs assistance Sitting-balance support: Bilateral upper extremity supported, Feet supported Sitting balance-Leahy Scale: Fair Sitting balance - Comments: Pt was able to maintain balance at EOB with UE support and CGA for safety   Standing balance support: Bilateral upper extremity supported, Reliant on assistive device for balance Standing balance-Leahy Scale: Poor Standing balance comment: reliant on RW                            Communication Communication Communication: No apparent difficulties  Cognition Arousal: Alert Behavior During Therapy: [facility]  for tasks assessed/performed   PT - Cognitive impairments: No apparent impairments                       PT - Cognition Comments: pt following commands, answering questions appropriately Following commands: Intact      Cueing Cueing Techniques: Verbal cues, Tactile cues, Visual cues  Exercises General Exercises - Lower Extremity Ankle Circles/Pumps: AROM, 10 reps, Supine, Both Heel Slides: 5 reps, AAROM, Both, Supine Hip ABduction/ADduction: AAROM, Both, 5 reps, Supine    General Comments General comments (skin integrity, edema, etc.): Pt was found in bed upon arrival. PT worked with  pt 30 minutes before pt going to get a venous duplex ultrasound to rule out DVT. Per RN and chart review, pt on lovenox so is within guidelines to mobilize at this time      Pertinent Vitals/Pain Pain Assessment Pain Assessment: Faces Faces Pain Scale: Hurts even more Pain Location: Left hip Pain Descriptors / Indicators: Aching, Discomfort, Grimacing, Guarding Pain Intervention(s): Limited activity within patient's tolerance, Monitored during session, Repositioned    Home Living                          Prior Function            PT Goals (current goals can now be found in the care plan section) Acute Rehab PT Goals Patient Stated Goal: return to walking PT Goal Formulation: With patient Time For Goal Achievement: 05/24/23 Potential to Achieve Goals: Good Progress towards PT goals: Progressing toward goals    Frequency    Min 2X/week      PT Plan      Co-evaluation              AM-PAC PT "6 Clicks" Mobility   Outcome Measure  Help needed turning from your back to your side while in a flat bed without using bedrails?: A Lot Help needed moving from lying on your back to sitting on the side of a flat bed without using bedrails?: A Lot Help needed moving to and from a bed to a chair (including a wheelchair)?: Total Help needed standing up from a chair using your arms (e.g., wheelchair or bedside chair)?: A Lot Help needed to walk in hospital room?: Total Help needed climbing 3-5 steps with a railing? : Total 6 Click Score: 9    End of Session Equipment Utilized During Treatment: Gait belt Activity Tolerance: Patient tolerated treatment well;Patient limited by fatigue;Patient limited by pain Patient left: in bed;with call bell/phone within reach;with bed alarm set;with nursing/sitter in room Nurse Communication: Mobility status PT Visit Diagnosis: Unsteadiness on feet (R26.81);Other abnormalities of gait and mobility (R26.89);Muscle weakness  (generalized) (M62.81);Pain Pain - Right/Left: Left Pain - part of body: Hip     Time: 1429-1501 PT Time Calculation (min) (ACUTE ONLY): 32 min  Charges:    $Therapeutic Exercise: 8-22 mins $Therapeutic Activity: 8-22 mins PT General Charges $$ ACUTE PT VISIT: 1 Visit                     Marye Round, PT DPT Acute Rehabilitation Services Secure Chat Preferred  Office [phone]    Devarius Nelles Sheliah Plane 05/15/2023, 4:29 PM

## 2023-05-16 DIAGNOSIS — S72332A Displaced oblique fracture of shaft of left femur, initial encounter for closed fracture: Secondary | ICD-10-CM | POA: Diagnosis not present

## 2023-05-16 DIAGNOSIS — D649 Anemia, unspecified: Secondary | ICD-10-CM | POA: Diagnosis not present

## 2023-05-16 LAB — CREATININE, SERUM
Creatinine, Ser: 0.58 mg/dL (ref 0.44–1.00)
GFR, Estimated: >60 mL/min (ref >60)

## 2023-05-16 LAB — HEMOGLOBIN AND HEMATOCRIT, BLOOD
HCT: 27.6 % — ABNORMAL LOW (ref 36.0–46.0)
Hemoglobin: 9 g/dL — ABNORMAL LOW (ref 12.0–15.0)

## 2023-05-16 MED ORDER — METHOCARBAMOL 750 MG PO TABS
750.0000 mg | ORAL_TABLET | Freq: Three times a day (TID) | ORAL | Status: DC | PRN
  Administered 2023-05-16 – 2023-05-17 (×3): 750 mg via ORAL
  Filled 2023-05-16 (×3): qty 1

## 2023-05-16 MED ORDER — POLYETHYLENE GLYCOL 3350 17 G PO PACK: 17.0000 g | PACK | Freq: Two times a day (BID) | ORAL | Status: AC

## 2023-05-16 MED ORDER — SENNOSIDES-DOCUSATE SODIUM 8.6-50 MG PO TABS: 1.0000 | ORAL_TABLET | Freq: Two times a day (BID) | ORAL | Status: AC

## 2023-05-16 MED ORDER — METHOCARBAMOL 750 MG PO TABS: 750.0000 mg | ORAL_TABLET | Freq: Three times a day (TID) | ORAL | Status: AC | PRN

## 2023-05-16 MED ORDER — METHOCARBAMOL 1000 MG/10ML IJ SOLN
500.0000 mg | Freq: Three times a day (TID) | INTRAMUSCULAR | Status: DC | PRN
  Filled 2023-05-16: qty 10

## 2023-05-16 NOTE — TOC Progression Note (Addendum)
 Transition of Care [facility]) - Progression Note    Patient Details  Name: CANDICE TOBEY MRN: [id number] Date of Birth: 1940/12/20  [facility]) CM/SW Contact  Lorri Frederick, LCSW Phone Number: 05/16/2023, 10:55 AM  Clinical Narrative:   CSW spoke with daughter [name], she does want to move forward with admission to [facility].  CSW confirmed by email with Julianne Handler that they can receive pt tomorrow.    Auth request submitted in [place] and approved: [id number], 5 days: 4/10-4/14.  Daughter is requesting transport through a different company: [phone]  1520: [facility], [phone].  CSW confirmed they can transport pt tomorrow, pickup scheduled for noon.      Expected Discharge Plan: Skilled Nursing Facility (vs CIR) Barriers to Discharge: Continued Medical Work up  Expected Discharge Plan and Services   Discharge Planning Services: CM Consult   Living arrangements for the past 2 months: Single Family Home                                       Social Determinants of Health (SDOH) Interventions SDOH Screenings   Food Insecurity: No Food Insecurity (05/08/2023)  Housing: Low Risk  (05/08/2023)  Transportation Needs: No Transportation Needs (05/08/2023)  Utilities: Not At Risk (05/08/2023)  Social Connections: Socially Isolated (05/08/2023)  Tobacco Use: Medium Risk (05/09/2023)    Readmission Risk Interventions     No data to display

## 2023-05-16 NOTE — Progress Notes (Signed)
 Occupational Therapy Treatment Patient Details Name: Sarah Castro MRN: [id number] DOB: 06-11-1940 Today's Date: 05/16/2023   History of present illness Pt is a 83 y.o female admitted 05/08/23 from EMS for fall at home. C/o of bilateral hip pain. X-ray showed L proximal subtrochanteric femur fx, no pelvis fx.  IMN performed 4/2. WBAT  PMH: arthritis, hypercholesteremia, HTN, macular degeneration, CVA   OT comments  Pt progressing well towards goals. Pt c/o of pain with mobility. OT educated pt on importance of pain management before therapy, to prevent limitation. Pt requiring max encouragement to attempt LB dressing, pain limiting ROM. Pt able to progressing standing balance to perform perineal hygiene. Encouraged use of BSC for toileting, pt verbalized understanding. Continue to recommend <3 hours of skilled rehab daily to optimize independence levels. Will continue to follow acutely.      If plan is discharge home, recommend the following:  A lot of help with bathing/dressing/bathroom;Assistance with cooking/housework;Assist for transportation;Help with stairs or ramp for entrance;A lot of help with walking and/or transfers   Equipment Recommendations  Other (comment) (Defer to next venue)    Recommendations for Other Services      Precautions / Restrictions Precautions Precautions: Fall Recall of Precautions/Restrictions: Intact Restrictions Weight Bearing Restrictions Per Provider Order: Yes LLE Weight Bearing Per Provider Order: Weight bearing as tolerated       Mobility Bed Mobility Overal bed mobility: Needs Assistance Bed Mobility: Supine to Sit     Supine to sit: Min assist, HOB elevated, Used rails     General bed mobility comments: Pt requiring assist for LLE and trunk    Transfers Overall transfer level: Needs assistance Equipment used: Rolling walker (2 wheels) Transfers: Sit to/from Stand, Bed to chair/wheelchair/BSC Sit to Stand: Min assist     Step  pivot transfers: Min assist, From elevated surface     General transfer comment: Heavy cueing for sequencing RW and max encouragement     Balance Overall balance assessment: Needs assistance Sitting-balance support: Bilateral upper extremity supported, Feet supported Sitting balance-Leahy Scale: Fair     Standing balance support: Bilateral upper extremity supported, Reliant on assistive device for balance Standing balance-Leahy Scale: Poor Standing balance comment: reliant on RW           ADL either performed or assessed with clinical judgement   ADL Overall ADL's : Needs assistance/impaired       Lower Body Dressing: Total assistance;Sitting/lateral leans Lower Body Dressing Details (indicate cue type and reason): Pt unable to don socks, and required max encouragement to attempt Toilet Transfer: Minimal assistance;Rolling walker (2 wheels) (step pivot) Toilet Transfer Details (indicate cue type and reason): Simulated in room Toileting- Clothing Manipulation and Hygiene: Minimal assistance Toileting - Clothing Manipulation Details (indicate cue type and reason): Min assist to steady while wiping       General ADL Comments: Pain continues to limit pt    Extremity/Trunk Assessment Upper Extremity Assessment Upper Extremity Assessment: RUE deficits/detail;Left hand dominant;Generalized weakness RUE Deficits / Details: PMH: broken elbow decreased shoulder ROM, trigger finger RUE: Shoulder pain with ROM RUE Sensation: WNL RUE Coordination: decreased gross motor;decreased fine motor   Lower Extremity Assessment Lower Extremity Assessment: Defer to PT evaluation        Vision   Vision Assessment?: No apparent visual deficits         Communication Communication Communication: No apparent difficulties   Cognition Arousal: Alert Behavior During Therapy: WFL for tasks assessed/performed Cognition: No apparent impairments  Following commands: Intact         Cueing   Cueing Techniques: Verbal cues, Tactile cues, Visual cues        General Comments L hip in pain and stitches had trace amounts of blood, and so did sheets in bed. Stitches intact. RN notified and verbalized she will notify MD    Pertinent Vitals/ Pain       Pain Assessment Pain Assessment: Faces Faces Pain Scale: Hurts little more Pain Location: Left hip Pain Descriptors / Indicators: Aching, Discomfort, Grimacing, Guarding Pain Intervention(s): Monitored during session, Patient requesting pain meds-RN notified   Frequency  Min 2X/week        Progress Toward Goals  OT Goals(current goals can now be found in the care plan section)  Progress towards OT goals: Progressing toward goals  Acute Rehab OT Goals Patient Stated Goal: To go to rehab OT Goal Formulation: With patient Time For Goal Achievement: 05/24/23 Potential to Achieve Goals: Good ADL Goals Pt Will Perform Lower Body Dressing: with mod assist;sitting/lateral leans;sit to/from stand Pt Will Transfer to Toilet: with mod assist;stand pivot transfer;bedside commode Pt Will Perform Toileting - Clothing Manipulation and hygiene: with mod assist;sit to/from stand  Plan         AM-PAC OT "6 Clicks" Daily Activity     Outcome Measure   Help from another person eating meals?: None Help from another person taking care of personal grooming?: A Little Help from another person toileting, which includes using toliet, bedpan, or urinal?: A Lot Help from another person bathing (including washing, rinsing, drying)?: A Lot Help from another person to put on and taking off regular upper body clothing?: A Little Help from another person to put on and taking off regular lower body clothing?: Total 6 Click Score: 15    End of Session Equipment Utilized During Treatment: Gait belt;Rolling walker (2 wheels)  OT Visit Diagnosis: Unsteadiness on feet (R26.81);Other abnormalities of gait and mobility (R26.89);History of  falling (Z91.81);Muscle weakness (generalized) (M62.81)   Activity Tolerance Patient limited by pain   Patient Left in chair;with call bell/phone within reach;with chair alarm set;with family/visitor present   Nurse Communication Mobility status        Time: 4098-1191 OT Time Calculation (min): 26 min  Charges: OT General Charges $OT Visit: 1 Visit OT Treatments $Self Care/Home Management : 23-37 mins  Ivor Messier, OT  Acute Rehabilitation Services Office [phone] Secure chat preferred   Marilynne Drivers 05/16/2023, 3:03 PM

## 2023-05-16 NOTE — Plan of Care (Signed)

## 2023-05-16 NOTE — Progress Notes (Signed)
 Orthopaedic Trauma Progress Note  SUBJECTIVE: Doing okay today, pain manageable other than having some muscle spasms to the leg.  Asking for muscle relaxer now.  Venous U/S negative for DVT LLE.  Making slow progress with therapies. No lightheadedness or dizziness. No chest pain. No SOB. No nausea/vomiting. No other complaints. Tolerating diet and fluids.  Patient's daughter Sarah Castro) at bedside.  It sounds like they have choosing a rehab facility in [place], [place]. The facility appears to have a bed available as early as tomorrow  OBJECTIVE:  Vitals:   05/15/23 2046 05/16/23 0501  BP: (!) 147/49 (!) 161/57  Pulse: 72 (!) 57  Resp: 18 14  Temp: 98.4 F (36.9 C) 97.6 F (36.4 C)  SpO2: 96% 93%    General: Sitting up in bed, NAD Respiratory: No increased work of breathing.  LLE: Incisions are clean, dry, intact with Dermabond in place.  Notable bruising and swelling over the hip and throughout the lateral thigh.  Compartments compressible.  No significant calf tenderness today.  Ankle DF/PF intact. +DP pulse  IMAGING: Stable post op imaging.   LABS:  Results for orders placed or performed during the hospital encounter of 05/08/23 (from the past 24 hours)  Creatinine, serum     Status: None   Collection Time: 05/16/23  5:30 AM  Result Value Ref Range   Creatinine, Ser 0.58 0.44 - 1.00 mg/dL   GFR, Estimated >16 >10 mL/min     ASSESSMENT: Sarah Castro is a 83 y.o. female, 7 Days Post-Op s/p INTRAMEDULLARY NAIL LEFT INTERTROCHANTERIC FEMUR FRACTURE  CV/Blood loss: Acute blood loss anemia, Hgb 8.3 on 05/13/23. Hemodynamically stable  PLAN: Weightbearing: WBAT LLE ROM:  Ok for unrestricted ROM  Incisional and dressing care: Ok to leave incisions open to air. Change dressing PRN Showering:  Ok to begin getting incisions wet  Orthopedic device(s): None  Pain management:  1. Tylenol 1000 mg q 6 hours PRN 2. Oxycodone 2.5-5 mg q 4 hours PRN 3. Dilaudid 0.5 mg q 4 hours PRN 4.  Neurontin 100 mg TID VTE prophylaxis: Lovenox, SCDs ID:  Ancef 2gm post op completed Foley/Lines:  No foley, KVO IVFs Impediments to Fracture Healing: Vit D level 86, continue home dose. No additional supplementation needed Dispo: Venous u/s ordered for this AM. PT/OT eval ongoing, recommending SNF. Okay for discharge from ortho standpoint once cleared by medicine team and therapies  D/C recommendations: - Oxycodone, Tylenol for pain control - Eliquis 2.5 mg BID x 30 days for DVT prophylaxis - No additional need for Vit D supplementation  Follow - up plan: 2 weeks after d/c for wound check and repeat x-rays   Contact information:  Truitt Merle MD, Thyra Breed PA-C. After hours and holidays please check [email or web address] for group call information for [facility]   Thompson Caul, PA-C [phone] (office) [email or web address]

## 2023-05-16 NOTE — Progress Notes (Signed)
 PROGRESS NOTE    Sarah Castro  ZOX:[id number] DOB: [birth year] DOA: 05/08/2023 PCP: Georgann Housekeeper, MD    Brief Narrative:  83 year old with history of stroke, hypertension, hyperlipidemia who had a ground-level fall and severe hip pain.  In the emergency room she was hemodynamically stable.  She was found to have left femoral shaft fracture.  Admitted to the hospital and underwent surgical fixation.  Subjective: Patient seen and examined.  Her daughter was at the bedside.  At rest her pain is controlled.  She gets episodic spasmodic pain around the incision site.  Significant bruising and ecchymosis around the left hip and left-sided abdominal wall. Daughter at the bedside.  There are working on getting patient to a skilled nursing facility in [place] and also looking for local orthopedics provider for follow-up.   Assessment & Plan:   Closed traumatic left humeral shaft fracture: ORIF/03/2023 with IM nailing intertrochanteric fracture femur.  Currently pain managed with Tylenol, oxycodone, will add Robaxin.  Continue with Neurontin 100 mg 3 times daily. DVT prophylaxis, Lovenox while in the hospital.  Ortho recommending discharge with Eliquis 2.5 mg twice daily for 30 days. Weightbearing as tolerated.  Acute on chronic iron-deficiency anemia, acute post operative anemia blood loss: Presentation hemoglobin 9.  Iron, B12 and folic acid within normal limits. Patient received total 2 units of PRBC, hemoglobin 8.3.  Will recheck tomorrow morning due to significant subcutaneous hematoma.  Chronic medical issues including History of stroke without residual deficits.  Stable. Essential hypertension: Blood pressures well-controlled now.  On metoprolol, irbesartan and doxazosin. Hyperlipidemia, on statin. Mood disorder: On Lexapro.  Continued.   DVT prophylaxis: enoxaparin (LOVENOX) injection 40 mg Start: 05/10/23 0800 SCDs Start: 05/08/23 1951   Code Status: Full code Family  Communication: Daughter at the bedside Disposition Plan: Status is: Inpatient Remains inpatient appropriate because: Waiting to go to a skilled nursing facility.     Consultants:  Orthopedics  Procedures:  ORIF  Antimicrobials:  Perioperative     Objective: Vitals:   05/15/23 1538 05/15/23 2046 05/16/23 0501 05/16/23 0921  BP: (!) 116/46 (!) 147/49 (!) 161/57 (!) 163/57  Pulse: 67 72 (!) 57 62  Resp: 16 18 14 18   Temp: 99.3 F (37.4 C) 98.4 F (36.9 C) 97.6 F (36.4 C) 99 F (37.2 C)  TempSrc: Oral Oral    SpO2: 99% 96% 93%   Weight:      Height:        Intake/Output Summary (Last 24 hours) at 05/16/2023 1328 Last data filed at 05/16/2023 0900 Gross per 24 hour  Intake 480 ml  Output 1600 ml  Net -1120 ml   Filed Weights   05/08/23 1511 05/09/23 1320  Weight: 59.9 kg 59.9 kg    Examination:  General exam: Appears calm and comfortable at rest. Respiratory system: Clear to auscultation. Respiratory effort normal. Cardiovascular system: S1 & S2 heard, RRR.  Gastrointestinal system: Soft.  Nontender. Central nervous system: Alert and oriented. No focal neurological deficits. Extremities: Symmetric 5 x 5 power. Skin:  Patient has ecchymosis lateral abdominal wall, lateral thigh.  Moderate edema.  Distal neurovascular status intact.  Incisions intact.    Data Reviewed: I have personally reviewed following labs and imaging studies  CBC: Recent Labs  Lab 05/09/23 1834 05/09/23 1924 05/10/23 0613 05/10/23 1901 05/11/23 0529 05/13/23 0603  WBC 7.0  --  7.6  --  7.2 6.9  HGB 5.8* 7.0* 5.9* 8.5* 8.0* 8.3*  HCT 17.9* 21.6* 17.8* 24.6* 23.8* 24.9*  MCV 96.8  --  95.2  --  91.5 94.7  PLT 115*  --  147*  --  138* 208   Basic Metabolic Panel: Recent Labs  Lab 05/09/23 1834 05/10/23 0613 05/11/23 0529 05/13/23 0603 05/16/23 0530  NA  --  133* 134* 133*  --   K  --  4.7 4.1 4.2  --   CL  --  98 100 100  --   CO2  --  25 26 28   --   GLUCOSE  --  144*  129* 104*  --   BUN  --  25* 19 13  --   CREATININE 0.65 0.69 0.54 0.63 0.58  CALCIUM  --  7.9* 7.8* 7.9*  --    GFR: Estimated Creatinine Clearance: 45 mL/min (by C-G formula based on SCr of 0.58 mg/dL). Liver Function Tests: No results for input(s): "AST", "ALT", "ALKPHOS", "BILITOT", "PROT", "ALBUMIN" in the last 168 hours. No results for input(s): "LIPASE", "AMYLASE" in the last 168 hours. No results for input(s): "AMMONIA" in the last 168 hours. Coagulation Profile: No results for input(s): "INR", "PROTIME" in the last 168 hours. Cardiac Enzymes: No results for input(s): "CKTOTAL", "CKMB", "CKMBINDEX", "TROPONINI" in the last 168 hours. BNP (last 3 results) No results for input(s): "PROBNP" in the last 8760 hours. HbA1C: No results for input(s): "HGBA1C" in the last 72 hours. CBG: No results for input(s): "GLUCAP" in the last 168 hours. Lipid Profile: No results for input(s): "CHOL", "HDL", "LDLCALC", "TRIG", "CHOLHDL", "LDLDIRECT" in the last 72 hours. Thyroid Function Tests: No results for input(s): "TSH", "T4TOTAL", "FREET4", "T3FREE", "THYROIDAB" in the last 72 hours. Anemia Panel: No results for input(s): "VITAMINB12", "FOLATE", "FERRITIN", "TIBC", "IRON", "RETICCTPCT" in the last 72 hours. Sepsis Labs: No results for input(s): "PROCALCITON", "LATICACIDVEN" in the last 168 hours.  Recent Results (from the past 240 hours)  MRSA Next Gen by PCR, Nasal     Status: None   Collection Time: 05/09/23 11:34 AM   Specimen: Nasal Mucosa; Nasal Swab  Result Value Ref Range Status   MRSA by PCR Next Gen NOT DETECTED NOT DETECTED Final    Comment: (NOTE) The GeneXpert MRSA Assay (FDA approved for NASAL specimens only), is one component of a comprehensive MRSA colonization surveillance program. It is not intended to diagnose MRSA infection nor to guide or monitor treatment for MRSA infections. Test performance is not FDA approved in patients less than 72 years old. Performed at  [facility], [address], [place], [place] [address]          Radiology Studies: VAS [place] LOWER EXTREMITY VENOUS (DVT) Result Date: 05/15/2023  Lower Venous DVT Study Patient Name:  Sarah Castro  Date of Exam:   05/15/2023 Medical Rec #: [id number]           Accession #:    [id number] Date of Birth: January 27, 1941          Patient Gender: F Patient Age:   34 years Exam Location:  [facility] Procedure:      VAS [place] LOWER EXTREMITY VENOUS (DVT) Referring Phys: [facility] --------------------------------------------------------------------------------  Indications: Swelling, and Edema.  Risk Factors: Surgery Fixation, fracture 05/09/23 Trauma Hip fracture 05/09/23 past pregnancy. Limitations: Poor ultrasound/tissue interface. Comparison Study: No changes seen since previous exam 09/05/22. Performing Technologist: Shona Simpson  Examination Guidelines: A complete evaluation includes B-mode imaging, spectral Doppler, color Doppler, and power Doppler as needed of all accessible portions of each vessel. Bilateral testing is considered  an integral part of a complete examination. Limited examinations for reoccurring indications may be performed as noted. The reflux portion of the exam is performed with the patient in reverse Trendelenburg.  +---------+---------------+---------+-----------+----------+-------------------+ RIGHT    CompressibilityPhasicitySpontaneityPropertiesThrombus Aging      +---------+---------------+---------+-----------+----------+-------------------+ CFV      Full           Yes      Yes                                      +---------+---------------+---------+-----------+----------+-------------------+ SFJ      Full                                                             +---------+---------------+---------+-----------+----------+-------------------+ FV Prox  Full                                                              +---------+---------------+---------+-----------+----------+-------------------+ FV Mid   Full                                                             +---------+---------------+---------+-----------+----------+-------------------+ FV DistalFull                    Yes                                      +---------+---------------+---------+-----------+----------+-------------------+ PFV      Full                                                             +---------+---------------+---------+-----------+----------+-------------------+ POP      Full           Yes      Yes                                      +---------+---------------+---------+-----------+----------+-------------------+ PTV      Full                    Yes                                      +---------+---------------+---------+-----------+----------+-------------------+ PERO     Full                    Yes  Not well visualized +---------+---------------+---------+-----------+----------+-------------------+   +---------+---------------+---------+-----------+----------+-------------------+ LEFT     CompressibilityPhasicitySpontaneityPropertiesThrombus Aging      +---------+---------------+---------+-----------+----------+-------------------+ CFV      Full           Yes      Yes                                      +---------+---------------+---------+-----------+----------+-------------------+ SFJ      Full                                                             +---------+---------------+---------+-----------+----------+-------------------+ FV Prox  Full           Yes      Yes                  Not well visualized +---------+---------------+---------+-----------+----------+-------------------+ FV Mid   Full                    Yes                                      +---------+---------------+---------+-----------+----------+-------------------+ FV  Distal                        Yes                  Not well visualized +---------+---------------+---------+-----------+----------+-------------------+ PFV                              Yes                  Not well visualized +---------+---------------+---------+-----------+----------+-------------------+ POP      Full           Yes      Yes                                      +---------+---------------+---------+-----------+----------+-------------------+ PTV      Full                    Yes                                      +---------+---------------+---------+-----------+----------+-------------------+ PERO     Full                    Yes                  Not well visualized +---------+---------------+---------+-----------+----------+-------------------+     Summary: BILATERAL: - No evidence of deep vein thrombosis seen in the lower extremities, bilaterally. -No evidence of popliteal cyst, bilaterally.   *See table(s) above for measurements and observations. Electronically signed by Lemar Livings MD on 05/15/2023 at 7:45:40 PM.    Final         Scheduled Meds:  cholecalciferol  1,000 Units Oral Daily   doxazosin  4  mg Oral BID   enoxaparin (LOVENOX) injection  40 mg Subcutaneous Q24H   escitalopram  10 mg Oral Daily   gabapentin  100 mg Oral TID   irbesartan  75 mg Oral Daily   metoprolol succinate  25 mg Oral QPM   polyethylene glycol  17 g Oral BID   pravastatin  20 mg Oral q1800   senna-docusate  1 tablet Oral BID   sodium chloride flush  3 mL Intravenous Q12H   Continuous Infusions:   LOS: 8 days    Time spent: 40 minutes    Dorcas Carrow, MD [facility]

## 2023-05-17 DIAGNOSIS — S72332A Displaced oblique fracture of shaft of left femur, initial encounter for closed fracture: Secondary | ICD-10-CM | POA: Diagnosis not present

## 2023-05-17 NOTE — Progress Notes (Signed)
 Report called to [name], spoke with [name], RN pt going to room 107, AVS reviewed. [name] daughter at bedside and aware of discharge, all personal belongings gathered

## 2023-05-17 NOTE — TOC Progression Note (Signed)
 Transition of Care [facility]) - Progression Note    Patient Details  Name: Sarah Castro MRN: [id number] Date of Birth: 03-27-1940  [facility] of [facility]) CM/SW Contact  Lorri Frederick, LCSW Phone Number: 05/17/2023, 10:57 AM  Clinical Narrative:   Georga Bora from [facility] requesting DC summary, updated notes, also asking for vaccination records.  Daughter [name] alerted for vaccination records and will call pt PCP.  Per RN, she received call from transport confirming 12 noon pickup.    1030: DC summary and additional notes sent.    Expected Discharge Plan: Skilled Nursing Facility (vs CIR) Barriers to Discharge: Continued Medical Work up  Expected Discharge Plan and Services   Discharge Planning Services: CM Consult   Living arrangements for the past 2 months: Single Family Home Expected Discharge Date: 05/17/23                                     Social Determinants of Health (SDOH) Interventions SDOH Screenings   Food Insecurity: No Food Insecurity (05/08/2023)  Housing: Low Risk  (05/08/2023)  Transportation Needs: No Transportation Needs (05/08/2023)  Utilities: Not At Risk (05/08/2023)  Social Connections: Socially Isolated (05/08/2023)  Tobacco Use: Medium Risk (05/09/2023)    Readmission Risk Interventions     No data to display

## 2023-05-17 NOTE — Progress Notes (Signed)
 EMS arrived to transport [place] to [facility] in [place], all personal belongings taken, packet taken with paper prescriptions daughter present and took belongings and aware of discharge to facility.

## 2023-05-17 NOTE — TOC Transition Note (Signed)
 Transition of Care [facility]) - Discharge Note   Patient Details  Name: Sarah Castro MRN: [id number] Date of Birth: 19-Jul-1940  Transition of Care [facility]) CM/SW Contact:  Lorri Frederick, LCSW Phone Number: 05/17/2023, 10:59 AM   Clinical Narrative:   Pt [facility] to [name], [facility].  RN call report to [phone].  [facility] transport scheduled for 12noon.    Final next level of care: Skilled Nursing Facility Barriers to Discharge: Barriers Resolved   Patient Goals and CMS Choice            Discharge Placement              Patient chooses bed at:  [facility] [place], [place]) Patient to be transferred to facility by: [facility] Name of family member notified: daughter [name] in room Patient and family notified of of transfer: 05/17/23  Discharge Plan and Services Additional resources added to the After Visit Summary for     Discharge Planning Services: CM Consult                                 Social Drivers of Health (SDOH) Interventions SDOH Screenings   Food Insecurity: No Food Insecurity (05/08/2023)  Housing: Low Risk  (05/08/2023)  Transportation Needs: No Transportation Needs (05/08/2023)  Utilities: Not At Risk (05/08/2023)  Social Connections: Socially Isolated (05/08/2023)  Tobacco Use: Medium Risk (05/09/2023)     Readmission Risk Interventions     No data to display

## 2023-05-17 NOTE — Discharge Summary (Signed)
 Physician Discharge Summary  Sarah [facility] ZOX:[id number] DOB: 05/10/1940 DOA: 05/08/2023  PCP: Sarah Housekeeper, MD  Admit date: 05/08/2023 Discharge date: 05/17/2023  Admitted From: Home Disposition: Skilled nursing facility  Recommendations for Outpatient Follow-up:  Follow up with PCP in 1-2 weeks Please obtain BMP/CBC in one week Orthopedics to schedule follow-up in 2 weeks  Home Health: N/A Equipment/Devices: N/A  Discharge Condition: Stable CODE STATUS: Full code Diet recommendation: Regular diet  Discharge summary: 83 year old with history of stroke, hypertension, hyperlipidemia who had a ground-level fall and severe hip pain.  In the emergency room she was hemodynamically stable.  She was found to have left femoral shaft fracture.  Admitted to the [facility] and underwent surgical fixation.  Assessment & Plan:   Closed traumatic left femoral fracture: ORIF/03/2023 with IM nailing intertrochanteric fracture femur.   Currently pain managed with Tylenol, oxycodone, add Robaxin.  Continue with Neurontin 100 mg 3 times daily. DVT prophylaxis, Lovenox while in the [facility].  Ortho recommending discharge with Eliquis 2.5 mg twice daily for 30 days. Weightbearing as tolerated. Ortho to schedule outpatient follow-up.   Acute on chronic iron-deficiency anemia, acute post operative anemia blood loss: Presentation hemoglobin 9.  Iron, B12 and folic acid within normal limits. Patient received total 2 units of PRBC, hemoglobin 8.3.  Repeat hemoglobin 9.  Patient does have significant subcutaneous bruising, will need close monitoring of hemoglobin.  Recheck in 1 week.  Chronic medical issues including History of stroke without residual deficits.  Stable. Essential hypertension: Blood pressures well-controlled now.  On metoprolol, irbesartan and doxazosin. Hyperlipidemia, on statin. Mood disorder: On Lexapro.  Continued.  Medically stable to transition to skilled nursing  facility.  Discharge Diagnoses:  Principal Problem:   Closed displaced oblique fracture of shaft of left femur (HCC) Active Problems:   Ground-level fall   Normocytic anemia   Hyperkalemia    Discharge Instructions  Discharge Instructions     Diet - low sodium heart healthy   Complete by: As directed    Increase activity slowly   Complete by: As directed       Allergies as of 05/17/2023       Reactions   Amlodipine Swelling   Garlic Diarrhea   Hydrocodone Nausea And Vomiting   Sulfa Antibiotics Rash        Medication List     STOP taking these medications    furosemide 40 MG tablet Commonly known as: LASIX   potassium chloride 10 MEQ tablet Commonly known as: KLOR-CON M       TAKE these medications    acetaminophen 500 MG tablet Commonly known as: TYLENOL Take 2 tablets (1,000 mg total) by mouth every 6 (six) hours as needed for mild pain (pain score 1-3).   AMBULATORY NON FORMULARY MEDICATION Rolling walker   apixaban 2.5 MG Tabs tablet Commonly known as: Eliquis Take 1 tablet (2.5 mg total) by mouth 2 (two) times daily.   CALCIUM 600 + D PO Take 1 tablet by mouth 2 (two) times daily.   cholecalciferol 10 MCG (400 UNIT) Tabs tablet Commonly known as: VITAMIN D3 Take 400 Units by mouth 2 (two) times daily.   doxazosin 8 MG tablet Commonly known as: CARDURA Take 4 mg by mouth 2 (two) times daily.   escitalopram 10 MG tablet Commonly known as: LEXAPRO Take 10 mg by mouth daily.   fish oil-omega-3 fatty acids 1000 MG capsule Take 1 g by mouth 2 (two) times daily.   gabapentin 100 MG capsule  Commonly known as: NEURONTIN Take 1 capsule (100 mg total) by mouth 3 (three) times daily.   lovastatin 40 MG tablet Commonly known as: MEVACOR Take 20 mg by mouth at bedtime.   methocarbamol 750 MG tablet Commonly known as: ROBAXIN Take 1 tablet (750 mg total) by mouth every 8 (eight) hours as needed for muscle spasms.   metoprolol succinate  25 MG 24 hr tablet Commonly known as: TOPROL-XL Take 25 mg by mouth every evening.   multivitamin with minerals tablet Take 1 tablet by mouth daily. What changed: Another medication with the same name was removed. Continue taking this medication, and follow the directions you see here.   olmesartan 40 MG tablet Commonly known as: BENICAR Take 40 mg by mouth daily.   oxyCODONE 5 MG immediate release tablet Commonly known as: Oxy IR/ROXICODONE Take 0.5-1 tablets (2.5-5 mg total) by mouth every 4 (four) hours as needed for severe pain (pain score 7-10) or moderate pain (pain score 4-6) (2.5 mg moderate pain, 5 mg severe pain).   polyethylene glycol 17 g packet Commonly known as: MIRALAX / GLYCOLAX Take 17 g by mouth 2 (two) times daily.   Red Yeast Rice Extract 600 MG Caps Take 600 mg by mouth 2 (two) times daily.   senna-docusate 8.6-50 MG tablet Commonly known as: Senokot-S Take 1 tablet by mouth 2 (two) times daily.   TURMERIC PO Take 400 mg by mouth 2 (two) times daily.        Follow-up Information     Haddix, Gillie Manners, MD. Schedule an appointment as soon as possible for a visit in 2 week(s).   Specialty: Orthopedic Surgery Why: As needed Contact information: [address] [place] [address] [phone]         Dr. Kathlee [facility]. Schedule an appointment as soon as possible for a visit in 2 week(s).   Why: for wound check and repeat x-rays left femur Contact information: [facility]  [address] [place], [place] [address]  Phone: [phone]               Allergies  Allergen Reactions   Amlodipine Swelling   Garlic Diarrhea   Hydrocodone Nausea And Vomiting   Sulfa Antibiotics Rash    Consultations: Orthopedics   Procedures/Studies: VAS [place] LOWER EXTREMITY VENOUS (DVT) Result Date: 05/15/2023  Lower Venous DVT Study Patient Name:  Sarah [facility]  Date of Exam:   05/15/2023 Medical Rec #:  [id number]           Accession #:    [id number] Date of Birth: 02/24/1940          Patient Gender: F Patient Age:   23 years [facility]am Location:  [facility] Procedure:      VAS [place] LOWER EXTREMITY VENOUS (DVT) Referring Phys: [facility] --------------------------------------------------------------------------------  Indications: Swelling, and Edema.  Risk Factors: Surgery Fixation, fracture 05/09/23 Trauma Hip fracture 05/09/23 past pregnancy. Limitations: Poor ultrasound/tissue interface. Comparison Study: No changes seen since previous exam 09/05/22. Performing Technologist: Shona Simpson  Examination Guidelines: A complete evaluation includes B-mode imaging, spectral Doppler, color Doppler, and power Doppler as needed of all accessible portions of each vessel. Bilateral testing is considered an integral part of a complete examination. Limited examinations for reoccurring indications may be performed as noted. The reflux portion of the exam is performed with the patient in reverse Trendelenburg.  +---------+---------------+---------+-----------+----------+-------------------+ RIGHT    CompressibilityPhasicitySpontaneityPropertiesThrombus Aging      +---------+---------------+---------+-----------+----------+-------------------+ CFV  Full           Yes      Yes                                      +---------+---------------+---------+-----------+----------+-------------------+ SFJ      Full                                                             +---------+---------------+---------+-----------+----------+-------------------+ FV Prox  Full                                                             +---------+---------------+---------+-----------+----------+-------------------+ FV Mid   Full                                                             +---------+---------------+---------+-----------+----------+-------------------+ FV DistalFull                    Yes                                       +---------+---------------+---------+-----------+----------+-------------------+ PFV      Full                                                             +---------+---------------+---------+-----------+----------+-------------------+ POP      Full           Yes      Yes                                      +---------+---------------+---------+-----------+----------+-------------------+ PTV      Full                    Yes                                      +---------+---------------+---------+-----------+----------+-------------------+ PERO     Full                    Yes                  Not well visualized +---------+---------------+---------+-----------+----------+-------------------+   +---------+---------------+---------+-----------+----------+-------------------+ LEFT     CompressibilityPhasicitySpontaneityPropertiesThrombus Aging      +---------+---------------+---------+-----------+----------+-------------------+ CFV      Full           Yes      Yes                                      +---------+---------------+---------+-----------+----------+-------------------+  SFJ      Full                                                             +---------+---------------+---------+-----------+----------+-------------------+ FV Prox  Full           Yes      Yes                  Not well visualized +---------+---------------+---------+-----------+----------+-------------------+ FV Mid   Full                    Yes                                      +---------+---------------+---------+-----------+----------+-------------------+ FV Distal                        Yes                  Not well visualized +---------+---------------+---------+-----------+----------+-------------------+ PFV                              Yes                  Not well visualized  +---------+---------------+---------+-----------+----------+-------------------+ POP      Full           Yes      Yes                                      +---------+---------------+---------+-----------+----------+-------------------+ PTV      Full                    Yes                                      +---------+---------------+---------+-----------+----------+-------------------+ PERO     Full                    Yes                  Not well visualized +---------+---------------+---------+-----------+----------+-------------------+     Summary: BILATERAL: - No evidence of deep vein thrombosis seen in the lower extremities, bilaterally. -No evidence of popliteal cyst, bilaterally.   *See table(s) above for measurements and observations. Electronically signed by Lemar Livings MD on 05/15/2023 at 7:45:40 PM.    Final    VAS [place] ABI WITH/WO TBI Result Date: 05/12/2023  LOWER EXTREMITY DOPPLER STUDY Patient Name:  JANASIA COVERDALE  Date of Exam:   05/10/2023 Medical Rec #: [id number]           Accession #:    [id number] Date of Birth: September 17, 1940          Patient Gender: F Patient Age:   47 years Exam Location:  [facility] Procedure:      VAS [place] ABI WITH/WO TBI Referring Phys: Dolly Rias --------------------------------------------------------------------------------  Indications: Peripheral artery disease. High Risk Factors: Hypertension, hyperlipidemia, prior  CVA. Other Factors: Recent fall.  Limitations: Today's exam was limited due to patient intolerant to cuff              pressure. Comparison Study: None Performing Technologist: Shona Simpson  Examination Guidelines: A complete evaluation includes at minimum, Doppler waveform signals and systolic blood pressure reading at the level of bilateral brachial, anterior tibial, and posterior tibial arteries, when vessel segments are accessible. Bilateral testing is considered an integral part of a complete examination. Photoelectric  Plethysmograph (PPG) waveforms and toe systolic pressure readings are included as required and additional duplex testing as needed. Limited examinations for reoccurring indications may be performed as noted.  ABI Findings: +--------+------------------+-----+----------+--------+ Right   Rt Pressure (mmHg)IndexWaveform  Comment  +--------+------------------+-----+----------+--------+ [id number]                    triphasic          +--------+------------------+-----+----------+--------+ PTA     249               1.54 monophasic         +--------+------------------+-----+----------+--------+ DP      162               1.00 monophasic         +--------+------------------+-----+----------+--------+ +--------+------------------+-----+----------+---------------------------+ Left    Lt Pressure (mmHg)IndexWaveform  Comment                     +--------+------------------+-----+----------+---------------------------+ [id number]                    triphasic                             +--------+------------------+-----+----------+---------------------------+ PTA                            monophasicIntolerant to cuff pressure +--------+------------------+-----+----------+---------------------------+ DP                             monophasicIntolerant to cuff pressure +--------+------------------+-----+----------+---------------------------+ +-------+-----------+-----------+------------+------------+ ABI/TBIToday's ABIToday's TBIPrevious ABIPrevious TBI +-------+-----------+-----------+------------+------------+ Right  Berry                                             +-------+-----------+-----------+------------+------------+ Left   1.00                                           +-------+-----------+-----------+------------+------------+   Summary: Right: Resting right ankle-brachial index indicates noncompressible right lower extremity arteries. Left: Unable to  aquire pressures. *See table(s) above for measurements and observations.  Electronically signed by Coral Else MD on 05/12/2023 at 2:07:01 PM.    Final    DG FEMUR PORT MIN 2 VIEWS LEFT Result Date: 05/09/2023 CLINICAL DATA:  Fracture, postop. EXAM: LEFT FEMUR PORTABLE 2 VIEWS COMPARISON:  Preoperative imaging FINDINGS: Femoral intramedullary nail with trans trochanteric and distal locking screw fixation traversing proximal femur fracture. Decreased angulation with improved alignment of fracture post fixation. Recent postsurgical change include air and edema in the soft tissues. Previous knee arthroplasty. IMPRESSION: ORIF of proximal femur fracture with improved alignment. Electronically Signed   By: Ivette Loyal.D.  On: 05/09/2023 18:17   DG FEMUR MIN 2 VIEWS LEFT Result Date: 05/09/2023 CLINICAL DATA:  Elective surgery. EXAM: LEFT FEMUR 2 VIEWS COMPARISON:  Preoperative imaging FINDINGS: Nine/copy expose the of the left femur submitted from the operating room. Femoral intramedullary nail with trans trochanteric and distal locking screw fixation traversing proximal femur fracture. Fluoroscopy time 3 minutes 22 seconds. Dose 22.05 mGy. IMPRESSION: Intraoperative fluoroscopy during proximal femur fracture fixation. Electronically Signed   By: Narda Rutherford M.D.   On: 05/09/2023 18:16   DG C-Arm 1-60 Min-No Report Result Date: 05/09/2023 Fluoroscopy was utilized by the requesting physician.  No radiographic interpretation.   DG C-Arm 1-60 Min-No Report Result Date: 05/09/2023 Fluoroscopy was utilized by the requesting physician.  No radiographic interpretation.   DG Femur Min 2 Views Left Result Date: 05/08/2023 CLINICAL DATA:  Status post fall with leg deformity EXAM: LEFT FEMUR 2 VIEWS; PELVIS - 1 VIEW COMPARISON:  Left hip radiograph dated 05/12/2020 FINDINGS: Oblique fracture of the proximal left femoral diaphysis extending to the greater trochanter with 2 shaft width posterior displacement  and 3.2 cm foreshortening. No acute pelvic fracture or diastasis. Degenerative changes of the bilateral hips. Old right inferior pubic ramus fracture. Left knee arthroplasty hardware appears intact. Soft tissues are unremarkable. IMPRESSION: Displaced oblique fracture of the proximal left femoral diaphysis extending to the greater trochanter. Electronically Signed   By: Agustin Cree M.D.   On: 05/08/2023 18:37   DG Pelvis 1-2 Views Result Date: 05/08/2023 CLINICAL DATA:  Status post fall with leg deformity EXAM: LEFT FEMUR 2 VIEWS; PELVIS - 1 VIEW COMPARISON:  Left hip radiograph dated 05/12/2020 FINDINGS: Oblique fracture of the proximal left femoral diaphysis extending to the greater trochanter with 2 shaft width posterior displacement and 3.2 cm foreshortening. No acute pelvic fracture or diastasis. Degenerative changes of the bilateral hips. Old right inferior pubic ramus fracture. Left knee arthroplasty hardware appears intact. Soft tissues are unremarkable. IMPRESSION: Displaced oblique fracture of the proximal left femoral diaphysis extending to the greater trochanter. Electronically Signed   By: Agustin Cree M.D.   On: 05/08/2023 18:37   DG Femur Min 2 Views Right Result Date: 05/08/2023 CLINICAL DATA:  Status post fall EXAM: RIGHT FEMUR 2 VIEWS COMPARISON:  Radiograph of the pelvis and left hip dated 05/08/2020 FINDINGS: There is no evidence of acute displaced fracture. Old inferior right pubic ramus fracture. Soft tissues are unremarkable. Vascular calcifications. Right knee arthroplasty hardware appears intact and well seated. IMPRESSION: No acute displaced right femoral fracture. Old inferior right pubic ramus fracture. Electronically Signed   By: Agustin Cree M.D.   On: 05/08/2023 18:30   (Echo, Carotid, EGD, Colonoscopy, ERCP)    Subjective: Patient seen and examined.  Denies any complaints.  Eating breakfast.  Robaxin helped a lot with the spasm.  Ready to transition to a SNF.   Discharge  Exam: Vitals:   05/17/23 0550 05/17/23 0815  BP: (!) 166/77 (!) 163/64  Pulse: 79 80  Resp: 16   Temp: 98.3 F (36.8 C) 98 F (36.7 C)  SpO2: 94% 97%   Vitals:   05/16/23 1548 05/16/23 2052 05/17/23 0550 05/17/23 0815  BP: (!) 99/45 (!) 154/48 (!) 166/77 (!) 163/64  Pulse: 65 78 79 80  Resp: 18 18 16    Temp: 99.1 F (37.3 C) 98.3 F (36.8 C) 98.3 F (36.8 C) 98 F (36.7 C)  TempSrc: Oral   Oral  SpO2: 99% 95% 94% 97%  Weight:  Height:        General: Pt is alert, awake, not in acute distress.  Pleasant interactive. Cardiovascular: RRR, S1/S2 +, no rubs, no gallops Respiratory: CTA bilaterally, no wheezing, no rhonchi Abdominal: Soft, NT, ND, bowel sounds + Extremities:  Extensive ecchymosis left lower abdominal wall, left lateral thigh and around the incision.  Distal neurovascular status intact.    The results of significant diagnostics from this hospitalization (including imaging, microbiology, ancillary and laboratory) are listed below for reference.     Microbiology: Recent Results (from the past 240 hours)  MRSA Next Gen by PCR, Nasal     Status: None   Collection Time: 05/09/23 11:34 AM   Specimen: Nasal Mucosa; Nasal Swab  Result Value Ref Range Status   MRSA by PCR Next Gen NOT DETECTED NOT DETECTED Final    Comment: (NOTE) The GeneXpert MRSA Assay (FDA approved for NASAL specimens only), is one component of a comprehensive MRSA colonization surveillance program. It is not intended to diagnose MRSA infection nor to guide or monitor treatment for MRSA infections. Test performance is not FDA approved in patients less than 73 years old. Performed at [facility], [address] [place], [place] [address]      Labs: BNP (last 3 results) No results for input(s): "BNP" in the last 8760 hours. Basic Metabolic Panel: Recent Labs  Lab 05/11/23 0529 05/13/23 0603 05/16/23 0530  NA 134* 133*  --   K 4.1 4.2  --   CL 100 100  --   CO2 26 28   --   GLUCOSE 129* 104*  --   BUN 19 13  --   CREATININE 0.54 0.63 0.58  CALCIUM 7.8* 7.9*  --    Liver Function Tests: No results for input(s): "AST", "ALT", "ALKPHOS", "BILITOT", "PROT", "ALBUMIN" in the last 168 hours. No results for input(s): "LIPASE", "AMYLASE" in the last 168 hours. No results for input(s): "AMMONIA" in the last 168 hours. CBC: Recent Labs  Lab 05/10/23 1901 05/11/23 0529 05/13/23 0603 05/16/23 1700  WBC  --  7.2 6.9  --   HGB 8.5* 8.0* 8.3* 9.0*  HCT 24.6* 23.8* 24.9* 27.6*  MCV  --  91.5 94.7  --   PLT  --  138* 208  --    Cardiac Enzymes: No results for input(s): "CKTOTAL", "CKMB", "CKMBINDEX", "TROPONINI" in the last 168 hours. BNP: Invalid input(s): "POCBNP" CBG: No results for input(s): "GLUCAP" in the last 168 hours. D-Dimer No results for input(s): "DDIMER" in the last 72 hours. Hgb A1c No results for input(s): "HGBA1C" in the last 72 hours. Lipid Profile No results for input(s): "CHOL", "HDL", "LDLCALC", "TRIG", "CHOLHDL", "LDLDIRECT" in the last 72 hours. Thyroid function studies No results for input(s): "TSH", "T4TOTAL", "T3FREE", "THYROIDAB" in the last 72 hours.  Invalid input(s): "FREET3" Anemia work up No results for input(s): "VITAMINB12", "FOLATE", "FERRITIN", "TIBC", "IRON", "RETICCTPCT" in the last 72 hours. Urinalysis    Component Value Date/Time   COLORURINE YELLOW 03/11/2012 1124   APPEARANCEUR CLEAR 03/11/2012 1124   LABSPEC 1.017 03/11/2012 1124   PHURINE 6.5 03/11/2012 1124   GLUCOSEU NEGATIVE 03/11/2012 1124   HGBUR NEGATIVE 03/11/2012 1124   BILIRUBINUR NEGATIVE 03/11/2012 1124   KETONESUR NEGATIVE 03/11/2012 1124   PROTEINUR NEGATIVE 03/11/2012 1124   UROBILINOGEN 0.2 03/11/2012 1124   NITRITE NEGATIVE 03/11/2012 1124   LEUKOCYTESUR NEGATIVE 03/11/2012 1124   Sepsis Labs Recent Labs  Lab 05/11/23 0529 05/13/23 0603  WBC 7.2 6.9  Microbiology Recent Results (from the past 240 hours)  MRSA Next Gen by  PCR, Nasal     Status: None   Collection Time: 05/09/23 11:34 AM   Specimen: Nasal Mucosa; Nasal Swab  Result Value Ref Range Status   MRSA by PCR Next Gen NOT DETECTED NOT DETECTED Final    Comment: (NOTE) The GeneXpert MRSA Assay (FDA approved for NASAL specimens only), is one component of a comprehensive MRSA colonization surveillance program. It is not intended to diagnose MRSA infection nor to guide or monitor treatment for MRSA infections. Test performance is not FDA approved in patients less than 26 years old. Performed at [facility], [address], [place], [place] [address]      Time coordinating discharge: 35 minutes  SIGNED:   Dorcas Carrow, MD  [facility] 05/17/2023, 8:47 AM

## 2023-06-19 ENCOUNTER — Ambulatory Visit: Payer: Medicare Other | Admitting: Podiatry
